# Patient Record
Sex: Female | Born: 1935 | Race: White | Hispanic: No | Marital: Married | Smoking: Never smoker
Health system: Southern US, Community
[De-identification: ages and names within clinical notes are randomized; demographics above are authoritative.]

## PROBLEM LIST (undated history)

## (undated) DIAGNOSIS — I509 Heart failure, unspecified: Secondary | ICD-10-CM

## (undated) DIAGNOSIS — I341 Nonrheumatic mitral (valve) prolapse: Secondary | ICD-10-CM

## (undated) DIAGNOSIS — R011 Cardiac murmur, unspecified: Secondary | ICD-10-CM

## (undated) DIAGNOSIS — I340 Nonrheumatic mitral (valve) insufficiency: Secondary | ICD-10-CM

## (undated) DIAGNOSIS — D649 Anemia, unspecified: Secondary | ICD-10-CM

## (undated) DIAGNOSIS — H6120 Impacted cerumen, unspecified ear: Secondary | ICD-10-CM

## (undated) DIAGNOSIS — G629 Polyneuropathy, unspecified: Secondary | ICD-10-CM

## (undated) DIAGNOSIS — E785 Hyperlipidemia, unspecified: Secondary | ICD-10-CM

## (undated) DIAGNOSIS — R0602 Shortness of breath: Secondary | ICD-10-CM

## (undated) DIAGNOSIS — A419 Sepsis, unspecified organism: Secondary | ICD-10-CM

## (undated) DIAGNOSIS — K649 Unspecified hemorrhoids: Secondary | ICD-10-CM

## (undated) DIAGNOSIS — I351 Nonrheumatic aortic (valve) insufficiency: Secondary | ICD-10-CM

## (undated) DIAGNOSIS — I5030 Unspecified diastolic (congestive) heart failure: Secondary | ICD-10-CM

## (undated) DIAGNOSIS — M1990 Unspecified osteoarthritis, unspecified site: Secondary | ICD-10-CM

## (undated) DIAGNOSIS — E039 Hypothyroidism, unspecified: Secondary | ICD-10-CM

## (undated) DIAGNOSIS — I2510 Atherosclerotic heart disease of native coronary artery without angina pectoris: Secondary | ICD-10-CM

## (undated) DIAGNOSIS — N3289 Other specified disorders of bladder: Secondary | ICD-10-CM

## (undated) DIAGNOSIS — Z8619 Personal history of other infectious and parasitic diseases: Secondary | ICD-10-CM

## (undated) DIAGNOSIS — C50919 Malignant neoplasm of unspecified site of unspecified female breast: Secondary | ICD-10-CM

## (undated) DIAGNOSIS — E7800 Pure hypercholesterolemia, unspecified: Secondary | ICD-10-CM

## (undated) DIAGNOSIS — N390 Urinary tract infection, site not specified: Secondary | ICD-10-CM

## (undated) HISTORY — DX: Sepsis, unspecified organism: A41.9

## (undated) HISTORY — DX: Hypothyroidism, unspecified: E03.9

## (undated) HISTORY — PX: MASTECTOMY: SHX3

## (undated) HISTORY — DX: Nonrheumatic mitral (valve) prolapse: I34.1

## (undated) HISTORY — DX: Nonrheumatic aortic (valve) insufficiency: I35.1

## (undated) HISTORY — DX: Atherosclerotic heart disease of native coronary artery without angina pectoris: I25.10

## (undated) HISTORY — DX: Unspecified osteoarthritis, unspecified site: M19.90

## (undated) HISTORY — DX: Anemia, unspecified: D64.9

## (undated) HISTORY — DX: Polyneuropathy, unspecified: G62.9

## (undated) HISTORY — PX: TONSILLECTOMY: SUR1361

## (undated) HISTORY — DX: Nonrheumatic mitral (valve) insufficiency: I34.0

## (undated) HISTORY — DX: Other specified disorders of bladder: N32.89

## (undated) HISTORY — DX: Hyperlipidemia, unspecified: E78.5

## (undated) HISTORY — PX: CATARACT EXTRACTION W/ INTRAOCULAR LENS  IMPLANT, BILATERAL: SHX1307

## (undated) HISTORY — DX: Personal history of other infectious and parasitic diseases: Z86.19

## (undated) HISTORY — DX: Urinary tract infection, site not specified: N39.0

## (undated) HISTORY — DX: Unspecified diastolic (congestive) heart failure: I50.30

## (undated) HISTORY — PX: CLOSED REDUCTION PATELLA FRACTURE: SUR230

## (undated) HISTORY — DX: Malignant neoplasm of unspecified site of unspecified female breast: C50.919

## (undated) HISTORY — PX: BREAST BIOPSY: SHX20

## (undated) HISTORY — DX: Impacted cerumen, unspecified ear: H61.20

---

## 2004-10-01 HISTORY — PX: APPENDECTOMY: SHX54

## 2005-02-16 ENCOUNTER — Encounter: Payer: Self-pay | Admitting: Internal Medicine

## 2005-04-09 ENCOUNTER — Ambulatory Visit: Payer: Self-pay | Admitting: Internal Medicine

## 2005-05-10 ENCOUNTER — Ambulatory Visit: Payer: Self-pay | Admitting: Internal Medicine

## 2005-05-18 ENCOUNTER — Ambulatory Visit: Payer: Self-pay | Admitting: Internal Medicine

## 2005-05-24 ENCOUNTER — Ambulatory Visit: Payer: Self-pay | Admitting: Cardiology

## 2005-06-23 ENCOUNTER — Ambulatory Visit: Payer: Self-pay | Admitting: Internal Medicine

## 2005-07-06 ENCOUNTER — Ambulatory Visit (HOSPITAL_COMMUNITY): Admission: RE | Admit: 2005-07-06 | Discharge: 2005-07-06 | Payer: Self-pay | Admitting: Internal Medicine

## 2005-07-06 ENCOUNTER — Ambulatory Visit: Payer: Self-pay | Admitting: Internal Medicine

## 2005-08-25 ENCOUNTER — Ambulatory Visit: Payer: Self-pay | Admitting: Cardiology

## 2005-10-19 ENCOUNTER — Ambulatory Visit: Payer: Self-pay | Admitting: Internal Medicine

## 2006-02-24 ENCOUNTER — Ambulatory Visit: Payer: Self-pay | Admitting: Cardiology

## 2006-02-28 ENCOUNTER — Ambulatory Visit: Payer: Self-pay | Admitting: Internal Medicine

## 2006-05-02 ENCOUNTER — Ambulatory Visit: Payer: Self-pay | Admitting: Internal Medicine

## 2006-06-28 ENCOUNTER — Ambulatory Visit: Payer: Self-pay | Admitting: Internal Medicine

## 2006-08-30 ENCOUNTER — Ambulatory Visit: Payer: Self-pay | Admitting: Cardiology

## 2006-10-19 ENCOUNTER — Ambulatory Visit: Payer: Self-pay | Admitting: Internal Medicine

## 2007-01-27 ENCOUNTER — Ambulatory Visit: Payer: Self-pay | Admitting: Cardiology

## 2007-02-07 ENCOUNTER — Encounter: Payer: Self-pay | Admitting: Cardiology

## 2007-02-07 ENCOUNTER — Ambulatory Visit: Payer: Self-pay

## 2007-07-07 ENCOUNTER — Ambulatory Visit: Payer: Self-pay | Admitting: Cardiology

## 2007-07-21 ENCOUNTER — Ambulatory Visit: Payer: Self-pay | Admitting: Cardiology

## 2007-07-21 LAB — CONVERTED CEMR LAB
Chloride: 109 meq/L (ref 96–112)
Glucose, Bld: 98 mg/dL (ref 70–99)

## 2007-10-16 ENCOUNTER — Encounter: Payer: Self-pay | Admitting: *Deleted

## 2007-10-16 DIAGNOSIS — E032 Hypothyroidism due to medicaments and other exogenous substances: Secondary | ICD-10-CM | POA: Insufficient documentation

## 2007-10-16 DIAGNOSIS — Z853 Personal history of malignant neoplasm of breast: Secondary | ICD-10-CM

## 2007-10-16 DIAGNOSIS — A3790 Whooping cough, unspecified species without pneumonia: Secondary | ICD-10-CM | POA: Insufficient documentation

## 2007-10-16 DIAGNOSIS — T462X1A Poisoning by other antidysrhythmic drugs, accidental (unintentional), initial encounter: Secondary | ICD-10-CM

## 2007-10-16 DIAGNOSIS — I359 Nonrheumatic aortic valve disorder, unspecified: Secondary | ICD-10-CM | POA: Insufficient documentation

## 2007-10-16 DIAGNOSIS — I2510 Atherosclerotic heart disease of native coronary artery without angina pectoris: Secondary | ICD-10-CM | POA: Insufficient documentation

## 2007-10-16 DIAGNOSIS — E785 Hyperlipidemia, unspecified: Secondary | ICD-10-CM

## 2007-10-16 DIAGNOSIS — R32 Unspecified urinary incontinence: Secondary | ICD-10-CM

## 2007-10-16 DIAGNOSIS — Z961 Presence of intraocular lens: Secondary | ICD-10-CM

## 2007-10-16 DIAGNOSIS — Z9849 Cataract extraction status, unspecified eye: Secondary | ICD-10-CM

## 2007-10-16 DIAGNOSIS — G609 Hereditary and idiopathic neuropathy, unspecified: Secondary | ICD-10-CM | POA: Insufficient documentation

## 2007-10-16 DIAGNOSIS — Z9010 Acquired absence of unspecified breast and nipple: Secondary | ICD-10-CM

## 2007-10-16 DIAGNOSIS — I059 Rheumatic mitral valve disease, unspecified: Secondary | ICD-10-CM | POA: Insufficient documentation

## 2007-10-16 DIAGNOSIS — Z9189 Other specified personal risk factors, not elsewhere classified: Secondary | ICD-10-CM | POA: Insufficient documentation

## 2007-10-16 DIAGNOSIS — M810 Age-related osteoporosis without current pathological fracture: Secondary | ICD-10-CM | POA: Insufficient documentation

## 2007-10-16 DIAGNOSIS — N3289 Other specified disorders of bladder: Secondary | ICD-10-CM

## 2007-10-16 DIAGNOSIS — M1990 Unspecified osteoarthritis, unspecified site: Secondary | ICD-10-CM | POA: Insufficient documentation

## 2007-10-16 DIAGNOSIS — K219 Gastro-esophageal reflux disease without esophagitis: Secondary | ICD-10-CM | POA: Insufficient documentation

## 2007-10-16 DIAGNOSIS — I080 Rheumatic disorders of both mitral and aortic valves: Secondary | ICD-10-CM | POA: Insufficient documentation

## 2007-10-16 DIAGNOSIS — Z9089 Acquired absence of other organs: Secondary | ICD-10-CM | POA: Insufficient documentation

## 2007-11-20 ENCOUNTER — Ambulatory Visit: Payer: Self-pay | Admitting: Internal Medicine

## 2007-11-20 DIAGNOSIS — D649 Anemia, unspecified: Secondary | ICD-10-CM

## 2007-11-20 LAB — CONVERTED CEMR LAB
Albumin: 3.7 g/dL (ref 3.5–5.2)
Alkaline Phosphatase: 47 units/L (ref 39–117)
BUN: 11 mg/dL (ref 6–23)
Basophils Absolute: 0.1 10*3/uL (ref 0.0–0.1)
Creatinine, Ser: 0.9 mg/dL (ref 0.4–1.2)
Eosinophils Absolute: 0.3 10*3/uL (ref 0.0–0.6)
Folate: >20 ng/mL
GFR calc Af Amer: 76 mL/min
GFR calc non Af Amer: 63 mL/min
HCT: 35.1 % — ABNORMAL LOW (ref 36.0–46.0)
HDL: 57.1 mg/dL (ref >39.0)
Lymphocytes Relative: 34.4 % (ref 12.0–46.0)
MCV: 95.2 fL (ref 78.0–100.0)
Monocytes Relative: 10 % (ref 3.0–11.0)
Neutro Abs: 3.7 10*3/uL (ref 1.4–7.7)
Neutrophils Relative %: 50.3 % (ref 43.0–77.0)
Sodium: 141 meq/L (ref 135–145)
TSH: 3.63 microintl units/mL (ref 0.35–5.50)
Total Bilirubin: 0.9 mg/dL (ref 0.3–1.2)
Total Protein: 6.7 g/dL (ref 6.0–8.3)
Triglycerides: 57 mg/dL (ref 0–149)
Vitamin B-12: 460 pg/mL (ref 211–911)
WBC: 7.3 10*3/uL (ref 4.5–10.5)

## 2007-11-21 ENCOUNTER — Encounter: Payer: Self-pay | Admitting: Internal Medicine

## 2008-01-11 ENCOUNTER — Ambulatory Visit: Payer: Self-pay | Admitting: Cardiology

## 2008-09-30 ENCOUNTER — Ambulatory Visit: Payer: Self-pay | Admitting: Cardiovascular Disease

## 2008-11-24 ENCOUNTER — Inpatient Hospital Stay (HOSPITAL_COMMUNITY): Admission: EM | Admit: 2008-11-24 | Discharge: 2008-11-29 | Payer: Self-pay | Admitting: Emergency Medicine

## 2008-11-24 ENCOUNTER — Ambulatory Visit: Payer: Self-pay | Admitting: Cardiology

## 2008-11-24 ENCOUNTER — Ambulatory Visit: Payer: Self-pay | Admitting: Internal Medicine

## 2008-11-25 ENCOUNTER — Encounter (INDEPENDENT_AMBULATORY_CARE_PROVIDER_SITE_OTHER): Payer: Self-pay | Admitting: Cardiology

## 2008-11-25 ENCOUNTER — Encounter: Payer: Self-pay | Admitting: Internal Medicine

## 2008-11-25 ENCOUNTER — Ambulatory Visit: Payer: Self-pay | Admitting: Vascular Surgery

## 2008-12-09 ENCOUNTER — Ambulatory Visit: Payer: Self-pay | Admitting: Internal Medicine

## 2008-12-09 ENCOUNTER — Telehealth: Payer: Self-pay | Admitting: Internal Medicine

## 2008-12-09 LAB — CONVERTED CEMR LAB
BUN: 19 mg/dL (ref 6–23)
Creatinine, Ser: 1.1 mg/dL (ref 0.4–1.2)
GFR calc Af Amer: 60 mL/min
Glucose, Bld: 96 mg/dL (ref 70–99)
Potassium: 3.8 meq/L (ref 3.5–5.1)
Sodium: 136 meq/L (ref 135–145)

## 2008-12-10 ENCOUNTER — Ambulatory Visit: Payer: Self-pay | Admitting: Internal Medicine

## 2008-12-10 DIAGNOSIS — I5030 Unspecified diastolic (congestive) heart failure: Secondary | ICD-10-CM | POA: Insufficient documentation

## 2008-12-10 DIAGNOSIS — N390 Urinary tract infection, site not specified: Secondary | ICD-10-CM

## 2008-12-16 ENCOUNTER — Ambulatory Visit: Payer: Self-pay | Admitting: Cardiovascular Disease

## 2008-12-23 ENCOUNTER — Encounter: Payer: Self-pay | Admitting: Internal Medicine

## 2009-01-07 ENCOUNTER — Telehealth: Payer: Self-pay | Admitting: Internal Medicine

## 2009-03-13 ENCOUNTER — Ambulatory Visit: Payer: Self-pay | Admitting: Internal Medicine

## 2009-03-13 DIAGNOSIS — H6120 Impacted cerumen, unspecified ear: Secondary | ICD-10-CM | POA: Insufficient documentation

## 2009-06-26 ENCOUNTER — Ambulatory Visit: Payer: Self-pay | Admitting: Cardiovascular Disease

## 2009-06-26 DIAGNOSIS — I5032 Chronic diastolic (congestive) heart failure: Secondary | ICD-10-CM

## 2009-06-30 LAB — CONVERTED CEMR LAB
CO2: 33 meq/L — ABNORMAL HIGH (ref 19–32)
Calcium: 9 mg/dL (ref 8.4–10.5)
Chloride: 101 meq/L (ref 96–112)
GFR calc non Af Amer: 55.43 mL/min (ref >60)

## 2009-07-18 ENCOUNTER — Ambulatory Visit: Payer: Self-pay | Admitting: Internal Medicine

## 2009-07-30 ENCOUNTER — Telehealth: Payer: Self-pay | Admitting: Cardiovascular Disease

## 2009-10-03 ENCOUNTER — Telehealth: Payer: Self-pay | Admitting: Cardiovascular Disease

## 2009-12-04 ENCOUNTER — Encounter: Payer: Self-pay | Admitting: Cardiovascular Disease

## 2009-12-04 ENCOUNTER — Ambulatory Visit: Payer: Self-pay

## 2009-12-04 ENCOUNTER — Ambulatory Visit: Payer: Self-pay | Admitting: Internal Medicine

## 2009-12-04 ENCOUNTER — Ambulatory Visit (HOSPITAL_COMMUNITY): Admission: RE | Admit: 2009-12-04 | Discharge: 2009-12-04 | Payer: Self-pay | Admitting: Cardiovascular Disease

## 2009-12-08 ENCOUNTER — Ambulatory Visit: Payer: Self-pay | Admitting: Cardiovascular Disease

## 2009-12-09 LAB — CONVERTED CEMR LAB
CO2: 34 meq/L — ABNORMAL HIGH (ref 19–32)
GFR calc non Af Amer: 49.61 mL/min (ref >60)
Glucose, Bld: 82 mg/dL (ref 70–99)
Sodium: 144 meq/L (ref 135–145)

## 2010-02-17 ENCOUNTER — Ambulatory Visit: Payer: Self-pay | Admitting: Internal Medicine

## 2010-02-17 LAB — CONVERTED CEMR LAB
BUN: 23 mg/dL (ref 6–23)
Basophils Absolute: 0 10*3/uL (ref 0.0–0.1)
Basophils Relative: 0.5 % (ref 0.0–3.0)
Chloride: 103 meq/L (ref 96–112)
Creatinine, Ser: 1.1 mg/dL (ref 0.4–1.2)
Eosinophils Absolute: 0.3 10*3/uL (ref 0.0–0.7)
Eosinophils Relative: 3.5 % (ref 0.0–5.0)
Iron: 47 ug/dL (ref 42–145)
MCV: 96.3 fL (ref 78.0–100.0)
Monocytes Absolute: 0.8 10*3/uL (ref 0.1–1.0)
Monocytes Relative: 10.4 % (ref 3.0–12.0)
Neutro Abs: 4.3 10*3/uL (ref 1.4–7.7)
Neutrophils Relative %: 56.4 % (ref 43.0–77.0)
Platelets: 150 10*3/uL (ref 150.0–400.0)
Potassium: 4 meq/L (ref 3.5–5.1)
RBC: 3.57 M/uL — ABNORMAL LOW (ref 3.87–5.11)
RDW: 14.2 % (ref 11.5–14.6)
Sodium: 144 meq/L (ref 135–145)
TSH: 3.95 microintl units/mL (ref 0.35–5.50)
Triglycerides: 155 mg/dL — ABNORMAL HIGH (ref 0.0–149.0)
VLDL: 31 mg/dL (ref 0.0–40.0)

## 2010-04-21 ENCOUNTER — Telehealth (INDEPENDENT_AMBULATORY_CARE_PROVIDER_SITE_OTHER): Payer: Self-pay | Admitting: *Deleted

## 2010-04-23 ENCOUNTER — Telehealth: Payer: Self-pay | Admitting: Cardiovascular Disease

## 2010-06-02 ENCOUNTER — Ambulatory Visit: Payer: Self-pay | Admitting: Cardiovascular Disease

## 2010-08-18 ENCOUNTER — Emergency Department (HOSPITAL_COMMUNITY)
Admission: EM | Admit: 2010-08-18 | Discharge: 2010-08-18 | Payer: Self-pay | Source: Home / Self Care | Admitting: Emergency Medicine

## 2010-08-25 ENCOUNTER — Ambulatory Visit: Payer: Self-pay | Admitting: Internal Medicine

## 2010-10-07 ENCOUNTER — Encounter: Payer: Self-pay | Admitting: Internal Medicine

## 2010-11-19 ENCOUNTER — Ambulatory Visit (HOSPITAL_COMMUNITY)
Admission: RE | Admit: 2010-11-19 | Discharge: 2010-11-19 | Payer: Self-pay | Source: Home / Self Care | Attending: Cardiovascular Disease | Admitting: Cardiovascular Disease

## 2010-11-19 ENCOUNTER — Encounter: Payer: Self-pay | Admitting: Cardiovascular Disease

## 2010-11-19 ENCOUNTER — Ambulatory Visit
Admission: RE | Admit: 2010-11-19 | Discharge: 2010-11-19 | Payer: Self-pay | Source: Home / Self Care | Attending: Cardiovascular Disease | Admitting: Cardiovascular Disease

## 2010-11-20 ENCOUNTER — Telehealth (INDEPENDENT_AMBULATORY_CARE_PROVIDER_SITE_OTHER): Payer: Self-pay | Admitting: *Deleted

## 2010-11-23 ENCOUNTER — Ambulatory Visit
Admission: RE | Admit: 2010-11-23 | Discharge: 2010-11-23 | Payer: Self-pay | Source: Home / Self Care | Attending: Cardiovascular Disease | Admitting: Cardiovascular Disease

## 2010-11-23 ENCOUNTER — Encounter: Payer: Self-pay | Admitting: Cardiovascular Disease

## 2010-11-30 ENCOUNTER — Ambulatory Visit: Admission: RE | Admit: 2010-11-30 | Discharge: 2010-11-30 | Payer: Self-pay | Source: Home / Self Care

## 2010-11-30 ENCOUNTER — Other Ambulatory Visit: Payer: Self-pay | Admitting: Cardiovascular Disease

## 2010-11-30 LAB — BASIC METABOLIC PANEL
CO2: 32 mEq/L (ref 19–32)
Calcium: 9.1 mg/dL (ref 8.4–10.5)
Creatinine, Ser: 1.3 mg/dL — ABNORMAL HIGH (ref 0.4–1.2)
GFR: 41.18 mL/min — ABNORMAL LOW (ref >60.00)
Sodium: 134 mEq/L — ABNORMAL LOW (ref 135–145)

## 2010-12-02 ENCOUNTER — Telehealth: Payer: Self-pay | Admitting: Cardiovascular Disease

## 2010-12-03 ENCOUNTER — Ambulatory Visit: Payer: Self-pay | Admitting: Cardiovascular Disease

## 2010-12-03 NOTE — Assessment & Plan Note (Signed)
Summary: 6 mo f/u      Allergies Added: NKDA  Visit Type:  Follow-up Primary Provider:  Norins  CC:  6 month follow up.  Pt states she is feeling well.  She does have SOB.  History of Present Illness: [name] is a pleasant 77 year old Caucasian female with a past medical history significant for hyperlipidemia, GERD, and severe mitral valvular regurgitation as well as diastolic dysfunction, who was admitted  to [facility] and treated for E. coli urosepsis as well as acute on chronic heart failure in January 2010 and was discharged on Lasix therapy.  She has done well on Lasix therapy.  She has some swelling in her ankles in the evening but this gets better at night. She continues to have mild dyspnea if she exerts herself.  She denies any episodes of chest pain, dizziness, near syncope, syncope, orthopnea or PND.  No other complaints today.    Current Medications (verified): 1)  Coreg 25 Mg  Tabs (Carvedilol) .... Take One Tablet Twice Daily 2)  Aspirin 81 Mg  Tabs (Aspirin) .... Take One Tablet Once Daily 3)  Multivitamins   Tabs (Multiple Vitamin) .... Take 1 Tablet By Mouth Once A Day 4)  Ferrous Sulfate 325 (65 Fe) Mg  Tabs (Ferrous Sulfate) .... As Needed 5)  Lasix 40 Mg Tabs (Furosemide) .... Take 1 Tablet By Mouth Once A Day 6)  Oscal 500/200 D-3 500-200 Mg-Unit  Tabs (Calcium-Vitamin D) .... Take 1 Tablet By Mouth Three Times A Day 7)  Digestive Advance Gas Defense Formula .... Take 1 Tablet By Mouth Once A Day As Needed 8)  Klor-Con 10 10 Meq Cr-Tabs (Potassium Chloride) .... 2 Tabs Once Daily 9)  Aleve 220 Mg Tabs (Naproxen Sodium) .... As Needed 10)  Preservision/lutein  Caps (Multiple Vitamins-Minerals) .... Take 1 Tablet By Mouth Two Times A Day 11)  Omega-3 Fish Oil 1000 Mg Caps (Omega-3 Fatty Acids) .... 2 Caps At Bedtime  Allergies (verified): No Known Drug Allergies  Past History:  Past Medical History: Reviewed history from 06/12/2009 and no changes  required. CORONARY ARTERY DISEASE (ICD-414.00) PERIPHERAL NEUROPATHY (ICD-356.9) AORTIC INSUFFICIENCY, MILD (ICD-424.1) CERUMEN IMPACTION, BILATERAL (ICD-380.4) UNSPECIFIED DIASTOLIC HEART FAILURE (ICD-428.30) HYPERLIPIDEMIA (ICD-272.4) MITRAL VALVE PROLAPSE (ICD-424.0) MITRAL REGURGITATION, SEVERE (ICD-396.3) UROSEPSIS (ICD-599.0) UNSPECIFIED ANEMIA (ICD-285.9) OSTEOPOROSIS (ICD-733.00) ADENOCARCINOMA, BREAST, HX OF (ICD-V10.3) Hx of IRRITABLE BLADDER (ICD-596.8) Hx of URINARY INCONTINENCE (ICD-788.30) HYPOTHYROIDISM, HX OF (ICD-V12.2) GASTROESOPHAGEAL REFLUX DISEASE (ICD-530.81) OSTEOARTHRITIS (ICD-715.90) APPENDECTOMY, HX OF (ICD-V45.79) Hx of WHOOPING COUGH (ICD-033.9) INTRAOCULAR LENS IMPLANT, BILATERAL, HX OF (ICD-V43.1) CATARACT EXTRACTIONS, BILATERAL, HX OF (ICD-V45.61) MASTECTOMY, LEFT, HX OF (ICD-V45.71) BREAST BIOPSY, HX OF (ICD-V15.9) FRACTURE, PATELLA REPAIR OF RIGHT KNEE (ICD-822.0) TONSILLECTOMY, HX OF (ICD-V45.79)    Social History: Reviewed history from 02/17/2010 and no changes required. HSG Married - 04/07/2038 - '2023-04-06 2 daughters - 1 died at birth, '25; 1 brother 2043/04/08; 2 grandchildren; 2 GGC Lives with daughter  End of LIfe Care: - has living will - DNR/DNI, no heroic measures Tobacco Use - No.  Alcohol Use - no Drug Use - no  Review of Systems       The patient complains of shortness of breath.  The patient denies fatigue, malaise, fever, weight gain/loss, vision loss, decreased hearing, hoarseness, chest pain, palpitations, prolonged cough, wheezing, sleep apnea, coughing up blood, abdominal pain, blood in stool, nausea, vomiting, diarrhea, heartburn, incontinence, blood in urine, muscle weakness, joint pain, leg swelling, rash, skin lesions, headache, fainting, dizziness, depression, anxiety, enlarged lymph  nodes, easy bruising or bleeding, and environmental allergies.    Vital Signs:  Patient profile:   77 year old female Height:      59 inches Weight:       135 pounds BMI:     27.37 Pulse rate:   69 / minute Pulse rhythm:   regular BP sitting:   118 / 58  (left arm)  Vitals Entered By: [name] CMA (June 02, 2010 1:56 PM)  Physical Exam  General:  General: Well developed, well nourished, NAD SKIN: warm, dry Psychiatric: Mood and affect normal Neck: No JVD, no carotid bruits, no thyromegaly, no lymphadenopathy. Lungs:Clear bilaterally, no wheezes, rhonci, crackles CV: RRR III/VI sysotlic murmurs at LSB. NO gallops rubs Abdomen: soft, NT, ND, BS present Extremities: Trace bilateral lower ext edema, pulses 1-2+.    EKG  Procedure date:  06/02/2010  Findings:      NSR, rate 69 bpm. Normal  Impression & Recommendations:  Problem # 1:  MITRAL REGURGITATION, SEVERE (ICD-396.3) Severe MR by echo 2/11. Volume status ok. Breathing is at baseline. I have discussed possibly adding an Ace inhibitor for afterload reduction but she does not wish to start any new medications. I think this is reasonable given her age and the fact that she is doing so well. Continue current therapy. Repeat echo in 6 months.   Problem # 2:  DIASTOLIC HEART FAILURE, CHRONIC (ICD-428.32) BP well controlled. No changes.   Her updated medication list for this problem includes:    Coreg 25 Mg Tabs (Carvedilol) .[name]... Take one tablet twice daily    Aspirin 81 Mg Tabs (Aspirin) .[name]... Take one tablet once daily    Lasix 40 Mg Tabs (Furosemide) .[name]... Take 1 tablet by mouth once a day  Patient Instructions: 1)  Your physician recommends that you schedule a follow-up appointment in: 6 months 2)  Your physician has requested that you have an echocardiogram.  Echocardiography is a painless test that uses sound waves to create images of your heart. It provides your doctor with information about the size and shape of your heart and how well your heart's chambers and valves are working.  This procedure takes approximately one hour. There are no restrictions for this  procedure. To be done in 6 months--week or so prior to appointment with Dr. Clifton James

## 2010-12-03 NOTE — Miscellaneous (Signed)
Summary: Declaration of Desire for a Natural Death  Declaration of Desire for a Natural Death   Imported By: Sherian Rein 02/26/2010 14:57:31  _____________________________________________________________________  External Attachment:    Type:   Image     Comment:   External Document

## 2010-12-03 NOTE — Assessment & Plan Note (Signed)
Summary: ER FU ON RECTAL BLEEDING/NWS  #   Vital Signs:  Patient profile:   77 year old female Height:      59 inches Weight:      134 pounds BMI:     27.16 O2 Sat:      96 % on Room air Temp:     97.7 degrees F oral Pulse rate:   68 / minute BP sitting:   102 / 60  (left arm) Cuff size:   regular  Vitals Entered By: Bill Salinas CMA (August 25, 2010 11:52 AM)  O2 Flow:  Room air CC: ER follow up from rectal bleeding/ ab   Primary Care Provider:  Norins  CC:  ER follow up from rectal bleeding/ ab.  History of Present Illness: Patient had a recent episode of hematochezia for which she was seen in the ED. ED record and all labs reviewed. She aparently had a hemorrhoidal bleed although cannot rule out diverticular bleed. She had a stable Hgb and has not had any further episodes of hematochezia. She is feeling well: no chest pain, no SOB, no rectal pain, no dizzyness.  Current Medications (verified): 1)  Coreg 25 Mg  Tabs (Carvedilol) .... Take One Tablet Twice Daily 2)  Aspirin 81 Mg  Tabs (Aspirin) .... Take One Tablet Once Daily 3)  Multivitamins   Tabs (Multiple Vitamin) .... Take 1 Tablet By Mouth Once A Day 4)  Ferrous Sulfate 325 (65 Fe) Mg  Tabs (Ferrous Sulfate) .... As Needed 5)  Lasix 40 Mg Tabs (Furosemide) .... Take 1 Tablet By Mouth Once A Day 6)  Oscal 500/200 D-3 500-200 Mg-Unit  Tabs (Calcium-Vitamin D) .... Take 1 Tablet By Mouth Three Times A Day 7)  Digestive Advance Gas Defense Formula .... Take 1 Tablet By Mouth Once A Day As Needed 8)  Klor-Con 10 10 Meq Cr-Tabs (Potassium Chloride) .... 2 Tabs Once Daily 9)  Aleve 220 Mg Tabs (Naproxen Sodium) .... As Needed 10)  Preservision/lutein  Caps (Multiple Vitamins-Minerals) .... Take 1 Tablet By Mouth Two Times A Day 11)  Omega-3 Fish Oil 1000 Mg Caps (Omega-3 Fatty Acids) .... 2 Caps At Bedtime  Allergies (verified): No Known Drug Allergies  Past History:  Past Medical History: Last updated:  06/12/2009 CORONARY ARTERY DISEASE (ICD-414.00) PERIPHERAL NEUROPATHY (ICD-356.9) AORTIC INSUFFICIENCY, MILD (ICD-424.1) CERUMEN IMPACTION, BILATERAL (ICD-380.4) UNSPECIFIED DIASTOLIC HEART FAILURE (ICD-428.30) HYPERLIPIDEMIA (ICD-272.4) MITRAL VALVE PROLAPSE (ICD-424.0) MITRAL REGURGITATION, SEVERE (ICD-396.3) UROSEPSIS (ICD-599.0) UNSPECIFIED ANEMIA (ICD-285.9) OSTEOPOROSIS (ICD-733.00) ADENOCARCINOMA, BREAST, HX OF (ICD-V10.3) Hx of IRRITABLE BLADDER (ICD-596.8) Hx of URINARY INCONTINENCE (ICD-788.30) HYPOTHYROIDISM, HX OF (ICD-V12.2) GASTROESOPHAGEAL REFLUX DISEASE (ICD-530.81) OSTEOARTHRITIS (ICD-715.90) APPENDECTOMY, HX OF (ICD-V45.79) Hx of WHOOPING COUGH (ICD-033.9) INTRAOCULAR LENS IMPLANT, BILATERAL, HX OF (ICD-V43.1) CATARACT EXTRACTIONS, BILATERAL, HX OF (ICD-V45.61) MASTECTOMY, LEFT, HX OF (ICD-V45.71) BREAST BIOPSY, HX OF (ICD-V15.9) FRACTURE, PATELLA REPAIR OF RIGHT KNEE (ICD-822.0) TONSILLECTOMY, HX OF (ICD-V45.79)    Past Surgical History: Last updated: 06/12/2009 APPENDECTOMY, HX OF (ICD-V45.79) INTRAOCULAR LENS IMPLANT, BILATERAL, HX OF (ICD-V43.1) CATARACT EXTRACTIONS, BILATERAL, HX OF (ICD-V45.61) MASTECTOMY, LEFT, HX OF (ICD-V45.71) BREAST BIOPSY, HX OF (ICD-V15.9) FRACTURE, PATELLA REPAIR OF RIGHT KNEE (ICD-822.0) TONSILLECTOMY, HX OF (ICD-V45.79)  Social History: Last updated: 02/17/2010 HSG Married - 03/21/2038 - '03/20/2023 2 daughters - 1 died at birth, '49; 1 brother 2043/03/22; 2 grandchildren; 2 GGC Lives with daughter  End of LIfe Care: - has living will - DNR/DNI, no heroic measures Tobacco Use - No.  Alcohol Use - no Drug Use - no  Review of Systems  The patient denies fever, weight loss, chest pain, syncope, dyspnea on exertion, abdominal pain, hematochezia, muscle weakness, and abnormal bleeding.    Physical Exam  General:  WNWD white woman looking younger than her 89 years in no distress Head:  normocephalic and atraumatic.   Eyes:  C&S  clear Neck:  supple.   Lungs:  normal respiratory effort and normal breath sounds.   Heart:  normal rate and regular rhythm.   Neurologic:  alert & oriented X3, cranial nerves II-XII intact, and gait normal.   Skin:  turgor normal and color normal.   Psych:  Oriented X3, normally interactive, and good eye contact.     Impression & Recommendations:  Problem # 1:  HEMATOCHEZIA (ICD-578.1) Patient with a single episode of hematochezia with a normal ED evaluation. She has not had any recurrence and she is feeling well. Most likely a hemorrhoidal bleed.  Plan - no further evaluation at this time.  Complete Medication List: 1)  Coreg 25 Mg Tabs (Carvedilol) .... Take one tablet twice daily 2)  Aspirin 81 Mg Tabs (Aspirin) .... Take one tablet once daily 3)  Multivitamins Tabs (Multiple vitamin) .... Take 1 tablet by mouth once a day 4)  Ferrous Sulfate 325 (65 Fe) Mg Tabs (Ferrous sulfate) .... As needed 5)  Lasix 40 Mg Tabs (Furosemide) .... Take 1 tablet by mouth once a day 6)  Oscal 500/200 D-3 500-200 Mg-unit Tabs (Calcium-vitamin d) .... Take 1 tablet by mouth three times a day 7)  Digestive Advance Gas Defense Formula  .... Take 1 tablet by mouth once a day as needed 8)  Klor-con 10 10 Meq Cr-tabs (Potassium chloride) .... 2 tabs once daily 9)  Aleve 220 Mg Tabs (Naproxen sodium) .... As needed 10)  Preservision/lutein Caps (Multiple vitamins-minerals) .... Take 1 tablet by mouth two times a day 11)  Omega-3 Fish Oil 1000 Mg Caps (Omega-3 fatty acids) .... 2 caps at bedtime   Orders Added: 1)  Est. Patient Level III [60454]

## 2010-12-03 NOTE — Progress Notes (Signed)
Summary: review lab/medicines   Phone Note Other Incoming   Summary of Call: Pt's daughter, [name], dropped off pages 4 and 5 from pt's office visit with Dr. Debby Bud  dated February 18, 2010 with Problem #4 highlighted. Also left copy of lab work dated February 18, 2010 with LDL of 23 highlighted. Note from daughter attached to this paperwork requesting Dr. Gibson Ramp recommendation regarding stopping lovastatin or reducing the dosage.  Will review with Dr. Clifton James when he is back in office. Daughter's phone number is [phone]. Spoke with daughter and let her know Dr.Lita Murillo is out of office this week Initial call taken by: Dossie Arbour, RN, BSN,  April 23, 2010 2:04 PM  Follow-up for Phone Call        Spoke with daughter. ok to stop lovastatin. cdm Follow-up by: Verne Carrow, MD,  April 28, 2010 1:45 PM

## 2010-12-03 NOTE — Progress Notes (Signed)
   Walk in Patient Form Recieved " Pt has questions about Med change" sent to Message Nurse [facility]  April 21, 2010 9:20 AM

## 2010-12-03 NOTE — Assessment & Plan Note (Signed)
Summary: CPX, LAST SCHED CPX WAS BUMPED/#/CD   Vital Signs:  Patient profile:   77 year old female Height:      59 inches (149.86 cm) Weight:      135.25 pounds (61.48 kg) O2 Sat:      96 % on Room air Temp:     97.9 degrees F (36.61 degrees C) oral Pulse rate:   67 / minute Pulse rhythm:   regular BP sitting:   112 / 54  (left arm) Cuff size:   regular  Vitals Entered By: Brenton Grills (February 17, 2010 11:14 AM)  O2 Flow:  Room air CC: CPX/aj  Vision Screening:      Vision Comments: last eye exam was in 04/2009 with Dr. [place]   Primary Care Provider:  Jabree Rebert  CC:  CPX/aj.  History of Present Illness: Bone health - reviewed DXA from [employer] - she is osteoporotic at spine and hip. she has been on bisphosphonate for much more than 5 years. She does c/o deep bone pain in the distal LE and of leg cramps.  Neck pain - stiffness and discomfort, limited range of motion.   Memory loss - poor recall of names, word finding.  Dysphagia - can choke on water, can't cough/clear her throat. Aspiration of thin liquids. Discussed swallow precautions including chin tuck.  Inspissated secretions - in the AM thick secretions hard to clear. Cantake mucinex at bedtime.   Iron deficiency anemia - question about how long to continue iron supplement. She does get "draggy" and this is helped by iron supplement.   Small dark lesion on the right zygoma.  Current Medications (verified): 1)  Coreg 25 Mg  Tabs (Carvedilol) .... Take One Tablet Twice Daily 2)  Lovastatin 40 Mg  Tabs (Lovastatin) .... Take One Tablet Once Daily 3)  Actonel 35 Mg  Tabs (Risedronate Sodium) .... Take Weekly As Directed 4)  Aspirin 81 Mg  Tabs (Aspirin) .... Take One Tablet Once Daily 5)  Multivitamins   Tabs (Multiple Vitamin) .... Take 1 Tablet By Mouth Once A Day 6)  Ferrous Sulfate 325 (65 Fe) Mg  Tabs (Ferrous Sulfate) .... As Needed 7)  Lasix 40 Mg Tabs (Furosemide) .... Take 1 Tablet By Mouth Once A  Day 8)  Oscal 500/200 D-3 500-200 Mg-Unit  Tabs (Calcium-Vitamin D) .... Take 1 Tablet By Mouth Three Times A Day 9)  Digestive Advance Gas Defense Formula .... Take 1 Tablet By Mouth Once A Day As Needed 10)  Klor-Con 10 10 Meq Cr-Tabs (Potassium Chloride) .... 2 Tabs Once Daily 11)  Aleve 220 Mg Tabs (Naproxen Sodium) .... As Needed 12)  Preservision/lutein  Caps (Multiple Vitamins-Minerals) .... Take 1 Tablet By Mouth Two Times A Day 13)  Omega-3 Fish Oil 1000 Mg Caps (Omega-3 Fatty Acids) .... 2 Caps At Bedtime  Allergies (verified): No Known Drug Allergies  Past History:  Past Medical History: Last updated: 06/12/2009 CORONARY ARTERY DISEASE (ICD-414.00) PERIPHERAL NEUROPATHY (ICD-356.9) AORTIC INSUFFICIENCY, MILD (ICD-424.1) CERUMEN IMPACTION, BILATERAL (ICD-380.4) UNSPECIFIED DIASTOLIC HEART FAILURE (ICD-428.30) HYPERLIPIDEMIA (ICD-272.4) MITRAL VALVE PROLAPSE (ICD-424.0) MITRAL REGURGITATION, SEVERE (ICD-396.3) UROSEPSIS (ICD-599.0) UNSPECIFIED ANEMIA (ICD-285.9) OSTEOPOROSIS (ICD-733.00) ADENOCARCINOMA, BREAST, HX OF (ICD-V10.3) Hx of IRRITABLE BLADDER (ICD-596.8) Hx of URINARY INCONTINENCE (ICD-788.30) HYPOTHYROIDISM, HX OF (ICD-V12.2) GASTROESOPHAGEAL REFLUX DISEASE (ICD-530.81) OSTEOARTHRITIS (ICD-715.90) APPENDECTOMY, HX OF (ICD-V45.79) Hx of WHOOPING COUGH (ICD-033.9) INTRAOCULAR LENS IMPLANT, BILATERAL, HX OF (ICD-V43.1) CATARACT EXTRACTIONS, BILATERAL, HX OF (ICD-V45.61) MASTECTOMY, LEFT, HX OF (ICD-V45.71) BREAST BIOPSY, HX OF (ICD-V15.9) FRACTURE, PATELLA REPAIR OF  RIGHT KNEE (ICD-822.0) TONSILLECTOMY, HX OF (ICD-V45.79)    Past Surgical History: Last updated: 06/12/2009 APPENDECTOMY, HX OF (ICD-V45.79) INTRAOCULAR LENS IMPLANT, BILATERAL, HX OF (ICD-V43.1) CATARACT EXTRACTIONS, BILATERAL, HX OF (ICD-V45.61) MASTECTOMY, LEFT, HX OF (ICD-V45.71) BREAST BIOPSY, HX OF (ICD-V15.9) FRACTURE, PATELLA REPAIR OF RIGHT KNEE (ICD-822.0) TONSILLECTOMY, HX OF  (ICD-V45.79)  Family History: Last updated: 06/12/2009 Mother: heart disease/history of breast cancer/deceased Father: deceased/stroke/heart disease Siblings:3 deceased sisters 1 sister CHF 1 sister Alzheimers/DM 1 sister heart disease 1 brother heart disease/deceased 1 brother arthritis  Social History: HSG Married - Apr 06, 2038 - 'Apr 05, 2023 2 daughters - 1 died at birth, '13; 1 brother 04/07/43; 2 grandchildren; 2 GGC Lives with daughter  End of LIfe Care: - has living will - DNR/DNI, no heroic measures Tobacco Use - No.  Alcohol Use - no Drug Use - no  Review of Systems       The patient complains of decreased hearing and peripheral edema.  The patient denies anorexia, fever, weight loss, weight gain, chest pain, syncope, dyspnea on exertion, headaches, abdominal pain, severe indigestion/heartburn, muscle weakness, difficulty walking, depression, and angioedema.    Physical Exam  General:  Elderly white female in no distress Head:  Normocephalic and atraumatic without obvious abnormalities. No apparent alopecia or balding. Eyes:  vision grossly intact, pupils equal, pupils round, corneas and lenses clear, and no injection.   Neck:  supple and full ROM.  Small nodule at C6-7. Decreased ROM. Chest Wall:  Kyphosis Breasts:  deferred Lungs:  Normal respiratory effort, chest expands symmetrically. Lungs are clear to auscultation, no crackles or wheezes. Heart:  normal rate and regular rhythm.  II/VI soft holosystolic murmur best at left sternal border. Abdomen:  soft, non-tender, and normal bowel sounds.   Msk:  normal ROM, no joint tenderness, no joint swelling, no joint warmth, and no redness over joints.   Pulses:  2+ radial Extremities:  no deformity Neurologic:  alert & oriented X3, cranial nerves II-XII intact, strength normal in all extremities, sensation intact to light touch, gait normal, and DTRs symmetrical and normal.   Skin:  turgor normal, color normal, no rashes, no suspicious  lesions, no petechiae, and no ulcerations.   Cervical Nodes:  no anterior cervical adenopathy and no posterior cervical adenopathy.   Psych:  Oriented X3, memory intact for recent and remote, normally interactive, and good eye contact.     Impression & Recommendations:  Problem # 1:  DIASTOLIC HEART FAILURE, CHRONIC (ICD-428.32) Stable at today's exam with sign of decompensation  Plan - continue present meds.  Her updated medication list for this problem includes:    Coreg 25 Mg Tabs (Carvedilol) .[name]... Take one tablet twice daily    Aspirin 81 Mg Tabs (Aspirin) .[name]... Take one tablet once daily    Lasix 40 Mg Tabs (Furosemide) .[name]... Take 1 tablet by mouth once a day  Problem # 2:  CORONARY ARTERY DISEASE (ICD-414.00) Doing well with no complaints of chest pain or discomfort  Her updated medication list for this problem includes:    Coreg 25 Mg Tabs (Carvedilol) .[name]... Take one tablet twice daily    Aspirin 81 Mg Tabs (Aspirin) .[name]... Take one tablet once daily    Lasix 40 Mg Tabs (Furosemide) .[name]... Take 1 tablet by mouth once a day  Problem # 3:  UNSPECIFIED ANEMIA (ICD-285.9) Patient has been on iron for anemia. She tolerates this well.  Plan - follow-up lab with recommendations to follow.   Her updated medication list for this  problem includes:    Ferrous Sulfate 325 (65 Fe) Mg Tabs (Ferrous sulfate) .[name]... As needed  Orders: TLB-CBC Platelet - w/Differential (85025-CBCD) TLB-IBC Pnl (Iron/FE;Transferrin) (83550-IBC)  Addendum - Hgb 11.8 which is good. Iron saturation is 17% which is low  Plan - continue iron supplementation  Problem # 4:  HYPERLIPIDEMIA (ICD-272.4) Will check lab.  Her updated medication list for this problem includes:    Lovastatin 40 Mg Tabs (Lovastatin) .[name]... Take one tablet once daily  Orders: TLB-Lipid Panel (80061-LIPID)  Addendum - LDL is 23.  Plan may stop lovastatin at will  Problem # 5:  OSTEOPOROSIS (ICD-733.00) Discussed on-going  therapy at length. She has been on biphosphonates for years. She has no intolerance. Shehas not had a DXA for several years. There is no data to know if there is a good indication to continue therapy.  Plan - may may continue actonel at her descretion or stop if she so wishes.  Her updated medication list for this problem includes:    Actonel 35 Mg Tabs (Risedronate sodium) .[name]... Take weekly as directed  Orders: TLB-BMP (Basic Metabolic Panel-BMET) (80048-METABOL)  Problem # 6:  End of Life Care Reviewed with her her wishes in regard to end of life care including resuscitation and heroic measures.  With her daughter present she clearly states she would not want attempted resuscitation, mechanical ventilation or other heroic measures that would no enhance her QOL.  Completed out of facility order Will file her Living will and HCPOA Completed MOST form- DNR/DNI, no heroics but she does wish active curative care.  Complete Medication List: 1)  Coreg 25 Mg Tabs (Carvedilol) .... Take one tablet twice daily 2)  Lovastatin 40 Mg Tabs (Lovastatin) .... Take one tablet once daily 3)  Actonel 35 Mg Tabs (Risedronate sodium) .... Take weekly as directed 4)  Aspirin 81 Mg Tabs (Aspirin) .... Take one tablet once daily 5)  Multivitamins Tabs (Multiple vitamin) .... Take 1 tablet by mouth once a day 6)  Ferrous Sulfate 325 (65 Fe) Mg Tabs (Ferrous sulfate) .... As needed 7)  Lasix 40 Mg Tabs (Furosemide) .... Take 1 tablet by mouth once a day 8)  Oscal 500/200 D-3 500-200 Mg-unit Tabs (Calcium-vitamin d) .... Take 1 tablet by mouth three times a day 9)  Digestive Advance Gas Defense Formula  .... Take 1 tablet by mouth once a day as needed 10)  Klor-con 10 10 Meq Cr-tabs (Potassium chloride) .... 2 tabs once daily 11)  Aleve 220 Mg Tabs (Naproxen sodium) .... As needed 12)  Preservision/lutein Caps (Multiple vitamins-minerals) .... Take 1 tablet by mouth two times a day 13)  Omega-3 Fish Oil 1000  Mg Caps (Omega-3 fatty acids) .... 2 caps at bedtime  Other Orders: TLB-TSH (Thyroid Stimulating Hormone) (16109-UEA)  Patient: [name] Note: All result statuses are Final unless otherwise noted.  Tests: (1) Lipid Panel (LIPID)   Cholesterol               115 mg/dL                   5-409     ATP III Classification            Desirable:  < 200 mg/dL                    Borderline High:  200 - 239 mg/dL               High:  > =  240 mg/dL   Triglycerides        [H]  155.0 mg/dL                 1.6-109.6     Normal:  <150 mg/dL     Borderline High:  045 - 199 mg/dL   HDL                       40.98 mg/dL                 >11.91   VLDL Cholesterol          31.0 mg/dL                  4.7-82.9   LDL Cholesterol           23 mg/dL                    5-62  CHO/HDL Ratio:  CHD Risk                             2                    Men          Women     1/2 Average Risk     3.4          3.3     Average Risk          5.0          4.4     2X Average Risk          9.6          7.1     3X Average Risk          15.0          11.0                           Tests: (2) TSH (TSH)   FastTSH                   3.95 uIU/mL                 0.35-5.50  Tests: (3) CBC Platelet w/Diff (CBCD)   White Cell Count          7.7 K/uL                    4.5-10.5   Red Cell Count       [L]  3.57 Mil/uL                 3.87-5.11   Hemoglobin           [L]  11.8 g/dL                   13.0-86.5   Hematocrit           [L]  34.4 %                      36.0-46.0   MCV                       96.3 fl                     78.0-100.0  MCHC                      34.4 g/dL                   16.1-09.6   RDW                       14.2 %                      11.5-14.6   Platelet Count            150.0 K/uL                  150.0-400.0   Neutrophil %              56.4 %                      43.0-77.0   Lymphocyte %              29.2 %                      12.0-46.0   Monocyte %                10.4 %                       3.0-12.0   Eosinophils%              3.5 %                       0.0-5.0   Basophils %               0.5 %                       0.0-3.0   Neutrophill Absolute      4.3 K/uL                    1.4-7.7   Lymphocyte Absolute       2.2 K/uL                    0.7-4.0   Monocyte Absolute         0.8 K/uL                    0.1-1.0  Eosinophils, Absolute                             0.3 K/uL                    0.0-0.7   Basophils Absolute        0.0 K/uL                    0.0-0.1  Tests: (4) IBC Panel (IBC)   Iron                      47 ug/dL                    04-540   Transferrin               245.1 mg/dL  212.0-360.0   Iron Saturation      [L]  13.7 %                      20.0-50.0  Tests: (5) BMP (METABOL)   Sodium                    144 mEq/L                   135-145   Potassium                 4.0 mEq/L                   3.5-5.1   Chloride                  103 mEq/L                   96-112   Carbon Dioxide       [H]  33 mEq/L                    19-32   Glucose              [H]  102 mg/dL                   04-54   BUN                       23 mg/dL                    0-98   Creatinine                1.1 mg/dL                   1.1-9.1   Calcium                   9.3 mg/dL                   4.7-82.9   GFR                       49.59 mL/min                >60  Preventive Care Screening  Last Flu Shot:    Date:  07/10/2009    Results:  historical   Pap Smear:    Date:  11/01/2008    Results:  normal

## 2010-12-03 NOTE — Assessment & Plan Note (Signed)
Summary: per check out/sf  Medications Added KLOR-CON 10 10 MEQ CR-TABS (POTASSIUM CHLORIDE) 2 tabs once daily OMEGA-3 FISH OIL 1000 MG CAPS (OMEGA-3 FATTY ACIDS) 2 caps at bedtime      Allergies Added: NKDA  Visit Type:  rov Primary Provider:  Norins  CC:  sob.Marland Kitchenedema/legs/feet.Marland Kitchendenies any cp.  History of Present Illness: Ms. Plaisted is a pleasant 77 year old Caucasian female with a past medical history significant for hyperlipidemia, GERD, and severe mitral valvular regurgitation as well as diastolic dysfunction, who was admitted  to [facility] and treated for E. coli urosepsis as well as acute on chronic heart failure in January 2010 and was discharged on Lasix therapy.  She reports much improvement in her baseline breathing since being on the Lasix. She has some swelling in her ankles in the evening but this gets better at night. She continues to have dyspnea if she exerts herself.  This has worsened slightly per her daughter.  She denies any episodes of chest pain, dizziness, near syncope, syncope, orthopnea or PND.  No other complaints today.   Current Medications (verified): 1)  Coreg 25 Mg  Tabs (Carvedilol) .... Take One Tablet Twice Daily 2)  Lovastatin 40 Mg  Tabs (Lovastatin) .... Take One Tablet Once Daily 3)  Actonel 35 Mg  Tabs (Risedronate Sodium) .... Take Weekly As Directed 4)  Aspirin 81 Mg  Tabs (Aspirin) .... Take One Tablet Once Daily 5)  Multivitamins   Tabs (Multiple Vitamin) .... Take 1 Tablet By Mouth Once A Day 6)  Ferrous Sulfate 325 (65 Fe) Mg  Tabs (Ferrous Sulfate) .... As Needed 7)  Lasix 40 Mg Tabs (Furosemide) .... Take 1 Tablet By Mouth Once A Day 8)  Oscal 500/200 D-3 500-200 Mg-Unit  Tabs (Calcium-Vitamin D) .... Take 1 Tablet By Mouth Three Times A Day 9)  Digestive Advance Gas Defense Formula .... Take 1 Tablet By Mouth Once A Day As Needed 10)  Klor-Con 10 10 Meq Cr-Tabs (Potassium Chloride) .... 2 Tabs Once Daily 11)  Aleve 220 Mg Tabs  (Naproxen Sodium) .... As Needed 12)  Preservision/lutein  Caps (Multiple Vitamins-Minerals) .... Take 1 Tablet By Mouth Two Times A Day 13)  Omega-3 Fish Oil 1000 Mg Caps (Omega-3 Fatty Acids) .... 2 Caps At Bedtime  Allergies (verified): No Known Drug Allergies  Past History:  Past Medical History: Reviewed history from 06/12/2009 and no changes required. CORONARY ARTERY DISEASE (ICD-414.00) PERIPHERAL NEUROPATHY (ICD-356.9) AORTIC INSUFFICIENCY, MILD (ICD-424.1) CERUMEN IMPACTION, BILATERAL (ICD-380.4) UNSPECIFIED DIASTOLIC HEART FAILURE (ICD-428.30) HYPERLIPIDEMIA (ICD-272.4) MITRAL VALVE PROLAPSE (ICD-424.0) MITRAL REGURGITATION, SEVERE (ICD-396.3) UROSEPSIS (ICD-599.0) UNSPECIFIED ANEMIA (ICD-285.9) OSTEOPOROSIS (ICD-733.00) ADENOCARCINOMA, BREAST, HX OF (ICD-V10.3) Hx of IRRITABLE BLADDER (ICD-596.8) Hx of URINARY INCONTINENCE (ICD-788.30) HYPOTHYROIDISM, HX OF (ICD-V12.2) GASTROESOPHAGEAL REFLUX DISEASE (ICD-530.81) OSTEOARTHRITIS (ICD-715.90) APPENDECTOMY, HX OF (ICD-V45.79) Hx of WHOOPING COUGH (ICD-033.9) INTRAOCULAR LENS IMPLANT, BILATERAL, HX OF (ICD-V43.1) CATARACT EXTRACTIONS, BILATERAL, HX OF (ICD-V45.61) MASTECTOMY, LEFT, HX OF (ICD-V45.71) BREAST BIOPSY, HX OF (ICD-V15.9) FRACTURE, PATELLA REPAIR OF RIGHT KNEE (ICD-822.0) TONSILLECTOMY, HX OF (ICD-V45.79)    Social History: Reviewed history from 06/12/2009 and no changes required. lives with daughter. Husband in [place] Retired  Married  Tobacco Use - No.  Alcohol Use - no Drug Use - no  Review of Systems       The patient complains of shortness of breath and leg swelling.  The patient denies fatigue, malaise, fever, weight gain/loss, vision loss, decreased hearing, hoarseness, chest pain, palpitations, prolonged cough, wheezing, sleep apnea, coughing up blood, abdominal  pain, blood in stool, nausea, vomiting, diarrhea, heartburn, incontinence, blood in urine, muscle weakness, joint pain, rash, skin  lesions, headache, fainting, dizziness, depression, anxiety, enlarged lymph nodes, easy bruising or bleeding, and environmental allergies.    Vital Signs:  Patient profile:   77 year old female Height:      59 inches Weight:      135 pounds Pulse rate:   70 / minute Pulse rhythm:   regular BP sitting:   122 / 70  (right arm) Cuff size:   regular  Vitals Entered By: Danielle Rankin, CMA (December 08, 2009 12:13 PM)  Physical Exam  General:  General: Well developed, well nourished, NAD SKIN: warm, dry Psychiatric: Mood and affect normal Neck: No JVD, no carotid bruits, no thyromegaly, no lymphadenopathy. Lungs:Clear bilaterally, no wheezes, rhonci, crackles CV: RRR III/VI sysotlic murmurs at LSB. NO gallops rubs Abdomen: soft, NT, ND, BS present Extremities: Trace bilateral lower ext edema, pulses 1-2+.    Echocardiogram  Procedure date:  12/04/2009  Findings:      - Left ventricle: The cavity size was normal. Wall thickness was       increased in a pattern of mild LVH. Systolic function was normal.       The estimated ejection fraction was in the range of 55% to 60%.     - Mitral valve: MR is 3-4/4 in severity. Calcified annulus. Mildly       thickened leaflets . Moderate to severe regurgitation.     - Left atrium: The atrium was moderately dilated.     - Pulmonary arteries: PA peak pressure: 65mm Hg (S).  Impression & Recommendations:  Problem # 1:  MITRAL REGURGITATION, SEVERE (ICD-396.3) Stable. I do not think that she will be an operable candidate given her age but she is stable at this time. Her LV size is normal and LV function is normal. Will plan to see her back in 6 months. Will continue Lasix at current dose. Check BMET today.   Problem # 2:  DIASTOLIC HEART FAILURE, CHRONIC (ICD-428.32) Volume status ok. Continue Lasix.   Her updated medication list for this problem includes:    Coreg 25 Mg Tabs (Carvedilol) .[name]... Take one tablet twice daily    Aspirin 81 Mg  Tabs (Aspirin) .[name]... Take one tablet once daily    Lasix 40 Mg Tabs (Furosemide) .[name]... Take 1 tablet by mouth once a day  Other Orders: TLB-BMP (Basic Metabolic Panel-BMET) (80048-METABOL)  Patient Instructions: 1)  Your physician recommends that you schedule a follow-up appointment in: 6 months 2)  Your physician recommends that you continue on your current medications as directed. Please refer to the Current Medication list given to you today.

## 2010-12-03 NOTE — Miscellaneous (Signed)
Summary: Health Care POA  Health Care POA   Imported By: Sherian Rein 02/26/2010 14:55:02  _____________________________________________________________________  External Attachment:    Type:   Image     Comment:   External Document

## 2010-12-03 NOTE — Letter (Signed)
Summary: Breast Prosthesis [employer] Bras/[employer] to [facility]  Breast Prosthesis & Bras/[employer]one to [employer]   Imported By: Sherian Rein 10/09/2010 12:10:41  _____________________________________________________________________  External Attachment:    Type:   Image     Comment:   External Document

## 2010-12-03 NOTE — Assessment & Plan Note (Signed)
Summary: 6 month follow up/39603/pla  Medications Added LASIX 40 MG TABS (FUROSEMIDE) Take 1 tablet by mouth two times a day. POTASSIUM CHLORIDE 20 MEQ PACK (POTASSIUM CHLORIDE) take one tablet by mouth two times a day.      Allergies Added: NKDA  Visit Type:  Follow-up Primary Provider:  Norins  CC:  SOB.  History of Present Illness: [name] is a pleasant 77 year old Caucasian female with a past medical history significant for hyperlipidemia, GERD, and severe mitral valvular regurgitation as well as diastolic dysfunction, who was admitted  to [facility] and treated for E. coli urosepsis as well as acute on chronic heart failure in January 2010 and was discharged on Lasix therapy.  She has done well on Lasix therapy.  She has some swelling in her ankles in the evening but this gets better at night. She continues to have dyspnea if she exerts herself.  This has worsened over the last year. She denies any episodes of chest pain, dizziness, near syncope, syncope, orthopnea or PND.  No other complaints today. Recent echo last week grossly unchanged from prior. MR is severe. LA is dilated. (see report below).    Current Medications (verified): 1)  Coreg 25 Mg  Tabs (Carvedilol) .... Take One Tablet Twice Daily 2)  Aspirin 81 Mg  Tabs (Aspirin) .... Take One Tablet Once Daily 3)  Multivitamins   Tabs (Multiple Vitamin) .... Take 1 Tablet By Mouth Once A Day 4)  Ferrous Sulfate 325 (65 Fe) Mg  Tabs (Ferrous Sulfate) .... As Needed 5)  Lasix 40 Mg Tabs (Furosemide) .... Take 1 Tablet By Mouth Once A Day 6)  Oscal 500/200 D-3 500-200 Mg-Unit  Tabs (Calcium-Vitamin D) .... Take 1 Tablet By Mouth Three Times A Day 7)  Klor-Con 10 10 Meq Cr-Tabs (Potassium Chloride) .... 2 Tabs Once Daily 8)  Aleve 220 Mg Tabs (Naproxen Sodium) .... As Needed 9)  Preservision/lutein  Caps (Multiple Vitamins-Minerals) .... Take 1 Tablet By Mouth Two Times A Day 10)  Omega-3 Fish Oil 1000 Mg Caps  (Omega-3 Fatty Acids) .... 2 Caps At Bedtime  Allergies (verified): No Known Drug Allergies  Past History:  Past Medical History: Reviewed history from 06/12/2009 and no changes required. CORONARY ARTERY DISEASE (ICD-414.00) PERIPHERAL NEUROPATHY (ICD-356.9) AORTIC INSUFFICIENCY, MILD (ICD-424.1) CERUMEN IMPACTION, BILATERAL (ICD-380.4) UNSPECIFIED DIASTOLIC HEART FAILURE (ICD-428.30) HYPERLIPIDEMIA (ICD-272.4) MITRAL VALVE PROLAPSE (ICD-424.0) MITRAL REGURGITATION, SEVERE (ICD-396.3) UROSEPSIS (ICD-599.0) UNSPECIFIED ANEMIA (ICD-285.9) OSTEOPOROSIS (ICD-733.00) ADENOCARCINOMA, BREAST, HX OF (ICD-V10.3) Hx of IRRITABLE BLADDER (ICD-596.8) Hx of URINARY INCONTINENCE (ICD-788.30) HYPOTHYROIDISM, HX OF (ICD-V12.2) GASTROESOPHAGEAL REFLUX DISEASE (ICD-530.81) OSTEOARTHRITIS (ICD-715.90) APPENDECTOMY, HX OF (ICD-V45.79) Hx of WHOOPING COUGH (ICD-033.9) INTRAOCULAR LENS IMPLANT, BILATERAL, HX OF (ICD-V43.1) CATARACT EXTRACTIONS, BILATERAL, HX OF (ICD-V45.61) MASTECTOMY, LEFT, HX OF (ICD-V45.71) BREAST BIOPSY, HX OF (ICD-V15.9) FRACTURE, PATELLA REPAIR OF RIGHT KNEE (ICD-822.0) TONSILLECTOMY, HX OF (ICD-V45.79)    Social History: Reviewed history from 02/17/2010 and no changes required. HSG Married - 11-Apr-2038 - '2023/04/10 2 daughters - 1 died at birth, '69; 1 brother 2043/04/12; 2 grandchildren; 2 GGC Lives with daughter  End of LIfe Care: - has living will - DNR/DNI, no heroic measures Tobacco Use - No.  Alcohol Use - no Drug Use - no  Review of Systems       The patient complains of shortness of breath.  The patient denies fatigue, malaise, fever, weight gain/loss, vision loss, decreased hearing, hoarseness, chest pain, palpitations, prolonged cough, wheezing, sleep apnea, coughing up blood, abdominal pain, blood in stool,  nausea, vomiting, diarrhea, heartburn, incontinence, blood in urine, muscle weakness, joint pain, leg swelling, rash, skin lesions, headache, fainting, dizziness,  depression, anxiety, enlarged lymph nodes, easy bruising or bleeding, and environmental allergies.    Vital Signs:  Patient profile:   77 year old female Height:      59 inches Weight:      134 pounds BMI:     27.16 Pulse rate:   79 / minute Resp:     16 per minute BP sitting:   131 / 59  (right arm)  Vitals Entered By: Marrion Coy, CNA (November 23, 2010 10:27 AM)  Physical Exam  General:  General: Well developed, well nourished, NAD HEENT: OP clear, mucus membranes moist SKIN: warm, dry Musculoskeletal: Muscle strength 5/5 all ext Psychiatric: Mood and affect normal Neck: No JVD, no carotid bruits, no thyromegaly, no lymphadenopathy. Lungs:Basilar crackles. No wheezes, rhonci, crackles CV: RRR with systolic murmur. No  gallops rubs Abdomen: soft, NT, ND, BS present Extremities: No edema, pulses 2+.    Echocardiogram  Procedure date:  11/19/2010  Findings:      Left ventricle: The cavity size was normal. Wall thickness was       normal. Systolic function was normal. The estimated ejection       fraction was in the range of 55% to 60%. Wall motion was normal;       there were no regional wall motion abnormalities. Features are       consistent with a pseudonormal left ventricular filling pattern,       with concomitant abnormal relaxation and increased filling       pressure (grade 2 diastolic dysfunction). E/medial e' > 15       suggests LV end diastolic pressure at least 20 mmHg.     - Aortic valve: There was no stenosis. Mild regurgitation.     - Mitral valve: Mildly calcified annulus. Moderately calcified       leaflets . Severe regurgitation. There does appear to be some       degree of prolapse of the posterior leaflet.     - Left atrium: The atrium was moderately to severely dilated.     - Right ventricle: The cavity size was normal. Systolic function was       normal.     - Atrial septum: Suspect PFO is present.     - Tricuspid valve: Peak RV-RA gradient:  40mm Hg (S).     - Pulmonary arteries: PA peak pressure: 50mm Hg (S).     - Systemic veins: IVC was not well-visualized. Will estimate RA       pressure at 10 mmHg.     Impressions:            - Normal LV size and systolic function, EF 55-60%. Moderate       diastolic dysfunction with evidence for elevated LV filling       pressure.Severe mitral regurgitation with calcified mitral valve       and some degree of posterior leaflet prolapse. Normal RV size and       systolic function. Moderate pulmonary hypertension.  EKG  Procedure date:  11/23/2010  Findings:      NSR, rate 79 bpm. Normal ekg.   Impression & Recommendations:  Problem # 1:  MITRAL REGURGITATION, SEVERE (ICD-396.3) Unchanged. She has severe MR. She is not an operative candidate given her age. LV systolic function is normal. Will increase Lasix to 40 mg by mouth two  times a day and also increase her K-Dur. Repeat BMET in one week. I will see her back in one month. I have discussed adding an Ace-inhibitor for afterload reduction in the past but she does not wish to start any new therapy. I think this is reasonable.   Other Orders: EKG w/ Interpretation (93000)  Patient Instructions: 1)  Your physician recommends that you schedule a follow-up appointment in 1 month. 2)  Your physician recommends that you return for lab work in 1 week for a BMP. 3)  Your physician has recommended you make the following change in your medication: INCREASE LASIX to 40mg  by mouth two times a day and INCREASE POTASSIUM to 40mg  by mouth daily.  Prescriptions: POTASSIUM CHLORIDE 20 MEQ PACK (POTASSIUM CHLORIDE) take one tablet by mouth two times a day.  #60 x 2   Entered by:   Whitney Maeola Sarah RN   Authorized by:   Verne Carrow, MD   Signed by:   Ellender Hose RN on 11/23/2010   Method used:   Electronically to        [facility]. [phone]* (retail)       [address]       [place],  [place]  [address]       Ph: [phone]       Fax: [phone]   RxID:   [id number] LASIX 40 MG TABS (FUROSEMIDE) Take 1 tablet by mouth two times a day.  #60 x 3   Entered by:   Whitney Maeola Sarah RN   Authorized by:   Verne Carrow, MD   Signed by:   Ellender Hose RN on 11/23/2010   Method used:   Electronically to        [facility]. [phone]* (retail)       [address]       [place], [place]  [address]       Ph: [phone]       Fax: [phone]   RxID:   [id number]   Appended Document: Orders Update    Clinical Lists Changes  Medications: Changed medication from POTASSIUM CHLORIDE 20 MEQ PACK (POTASSIUM CHLORIDE) take one tablet by mouth two times a day. to POTASSIUM CHLORIDE 40 MEQ/15ML (20%) LIQD (POTASSIUM CHLORIDE) take one daily by mouth. - Signed Rx of POTASSIUM CHLORIDE 40 MEQ/15ML (20%) LIQD (POTASSIUM CHLORIDE) take one daily by mouth.;  #30 x 1;  Signed;  Entered by: Whitney Maeola Sarah RN;  Authorized by: Verne Carrow, MD;  Method used: Electronically to [facility]. #[id number]*, [address] [place], [place], [place]  [address], Ph: [phone], Fax: [phone]    Prescriptions: POTASSIUM CHLORIDE 40 MEQ/15ML (20%) LIQD (POTASSIUM CHLORIDE) take one daily by mouth.  #30 x 1   Entered by:   Whitney Maeola Sarah RN   Authorized by:   Verne Carrow, MD   Signed by:   Ellender Hose RN on 11/23/2010   Method used:   Electronically to        [facility]. [phone]* (retail)       [address]       [place], [place]  [address]       Ph: [phone]       Fax: [phone]   RxID:   [id number]

## 2010-12-03 NOTE — Progress Notes (Signed)
   Phone Note Outgoing Call   Call placed by: Deliah Goody, RN,  November 20, 2010 3:15 PM Summary of Call: called pt with echo results, while on the phone the pt c/o increased SOB. she states she get more SOB when doing activities in the home or if ambulating. she denies any edema and has had no change in her weight. she requested to see dr Sanjuana Kava sooner than feb appt. follow up appt made Deliah Goody, RN  November 20, 2010 3:22 PM

## 2010-12-09 NOTE — Progress Notes (Signed)
Summary: pt daughter rtn call   Phone Note Call from Patient Call back at Home Phone [phone] Call back at [phone]   Caller: Daughter/Claudia Reason for Call: Talk to Nurse, Talk to Doctor Summary of Call: rtn call to get lab results and you can call either number and if she dosen't answer you can leave a message Initial call taken by: Omer Jack,  December 02, 2010 10:58 AM  Follow-up for Phone Call        patient aware of test results. Whitney Maeola Sarah RN  December 02, 2010 11:25 AM  Follow-up by: Whitney Maeola Sarah RN,  December 02, 2010 11:25 AM

## 2010-12-24 ENCOUNTER — Ambulatory Visit (INDEPENDENT_AMBULATORY_CARE_PROVIDER_SITE_OTHER): Payer: Medicare Other | Admitting: Cardiovascular Disease

## 2010-12-24 ENCOUNTER — Other Ambulatory Visit: Payer: Self-pay | Admitting: Cardiovascular Disease

## 2010-12-24 ENCOUNTER — Encounter: Payer: Self-pay | Admitting: Cardiovascular Disease

## 2010-12-24 DIAGNOSIS — I080 Rheumatic disorders of both mitral and aortic valves: Secondary | ICD-10-CM

## 2010-12-24 DIAGNOSIS — I5032 Chronic diastolic (congestive) heart failure: Secondary | ICD-10-CM

## 2010-12-24 LAB — BASIC METABOLIC PANEL
Chloride: 99 mEq/L (ref 96–112)
GFR: 39.07 mL/min — ABNORMAL LOW (ref >60.00)
Glucose, Bld: 83 mg/dL (ref 70–99)
Potassium: 4.1 mEq/L (ref 3.5–5.1)

## 2010-12-29 NOTE — Assessment & Plan Note (Signed)
Summary: ov per check out on 1/23.Julia KitchenMarland Kitchenper daughter call/lg  Medications Added POTASSIUM CHLORIDE 40 MEQ/15ML (20%) LIQD (POTASSIUM CHLORIDE) take 1 TBSP once daily LISINOPRIL 5 MG TABS (LISINOPRIL) Take one tablet by mouth daily      Allergies Added: NKDA  Visit Type:  Follow-up Primary Provider:  Norins  CC:  sob at times and pt has no other complaints today.[name]  History of Present Illness: [name] is a pleasant 77 year old Caucasian female with a past medical history significant for hyperlipidemia, GERD, and severe mitral valvular regurgitation as well as diastolic dysfunction, who was admitted  to [facility] and treated for E. coli urosepsis as well as acute on chronic heart failure in January 2010 and was discharged on Lasix therapy.  She has done well on Lasix therapy. Recent echo  grossly unchanged from prior. MR is severe. LA is dilated.  I saw her one month ago and she c/o increased dyspnea. I increased her Lasix to 40 mg by mouth two times a day. She is here for f/u. She seems to be doing slightly better. No LE edema. Some dyspnea with exertion.    Current Medications (verified): 1)  Coreg 25 Mg  Tabs (Carvedilol) .... Take One Tablet Twice Daily 2)  Aspirin 81 Mg  Tabs (Aspirin) .... Take One Tablet Once Daily 3)  Multivitamins   Tabs (Multiple Vitamin) .... Take 1 Tablet By Mouth Once A Day 4)  Ferrous Sulfate 325 (65 Fe) Mg  Tabs (Ferrous Sulfate) .... As Needed 5)  Lasix 40 Mg Tabs (Furosemide) .... Take 1 Tablet By Mouth Two Times A Day. 6)  Oscal 500/200 D-3 500-200 Mg-Unit  Tabs (Calcium-Vitamin D) .... Take 1 Tablet By Mouth Three Times A Day 7)  Potassium Chloride 40 Meq/42ml (20%) Liqd (Potassium Chloride) .... Take 1 Tbsp Once Daily 8)  Aleve 220 Mg Tabs (Naproxen Sodium) .... As Needed 9)  Preservision/lutein  Caps (Multiple Vitamins-Minerals) .... Take 1 Tablet By Mouth Two Times A Day 10)  Omega-3 Fish Oil 1000 Mg Caps (Omega-3 Fatty Acids) .... 2 Caps  At Bedtime  Allergies (verified): No Known Drug Allergies  Past History:  Past Medical History: Reviewed history from 06/12/2009 and no changes required. CORONARY ARTERY DISEASE (ICD-414.00) PERIPHERAL NEUROPATHY (ICD-356.9) AORTIC INSUFFICIENCY, MILD (ICD-424.1) CERUMEN IMPACTION, BILATERAL (ICD-380.4) UNSPECIFIED DIASTOLIC HEART FAILURE (ICD-428.30) HYPERLIPIDEMIA (ICD-272.4) MITRAL VALVE PROLAPSE (ICD-424.0) MITRAL REGURGITATION, SEVERE (ICD-396.3) UROSEPSIS (ICD-599.0) UNSPECIFIED ANEMIA (ICD-285.9) OSTEOPOROSIS (ICD-733.00) ADENOCARCINOMA, BREAST, HX OF (ICD-V10.3) Hx of IRRITABLE BLADDER (ICD-596.8) Hx of URINARY INCONTINENCE (ICD-788.30) HYPOTHYROIDISM, HX OF (ICD-V12.2) GASTROESOPHAGEAL REFLUX DISEASE (ICD-530.81) OSTEOARTHRITIS (ICD-715.90) APPENDECTOMY, HX OF (ICD-V45.79) Hx of WHOOPING COUGH (ICD-033.9) INTRAOCULAR LENS IMPLANT, BILATERAL, HX OF (ICD-V43.1) CATARACT EXTRACTIONS, BILATERAL, HX OF (ICD-V45.61) MASTECTOMY, LEFT, HX OF (ICD-V45.71) BREAST BIOPSY, HX OF (ICD-V15.9) FRACTURE, PATELLA REPAIR OF RIGHT KNEE (ICD-822.0) TONSILLECTOMY, HX OF (ICD-V45.79)    Social History: Reviewed history from 02/17/2010 and no changes required. HSG Married - 04-Apr-2038 - '04/03/23 2 daughters - 1 died at birth, '30; 1 brother April 05, 2043; 2 grandchildren; 2 GGC Lives with daughter  End of LIfe Care: - has living will - DNR/DNI, no heroic measures Tobacco Use - No.  Alcohol Use - no Drug Use - no  Review of Systems       The patient complains of shortness of breath.  The patient denies fatigue, malaise, fever, weight gain/loss, vision loss, decreased hearing, hoarseness, chest pain, palpitations, prolonged cough, wheezing, sleep apnea, coughing up blood, abdominal pain, blood in stool, nausea, vomiting, diarrhea,  heartburn, incontinence, blood in urine, muscle weakness, joint pain, leg swelling, rash, skin lesions, headache, fainting, dizziness, depression, anxiety, enlarged lymph nodes,  easy bruising or bleeding, and environmental allergies.    Vital Signs:  Patient profile:   77 year old female Height:      59 inches Weight:      132.50 pounds Resp:     18 per minute BP sitting:   124 / 62  (left arm) Cuff size:   regular  Vitals Entered By: Celestia Khat, CMA (December 24, 2010 2:30 PM)  Physical Exam  General:  General: Well developed, well nourished, NAD HEENT: OP clear, mucus membranes moist Musculoskeletal: Muscle strength 5/5 all ext Psychiatric: Mood and affect normal Neck: No JVD, no carotid bruits, no thyromegaly, no lymphadenopathy. Lungs:Clear bilaterally, no wheezes, rhonci, crackles CV: RRR with systolic  murmur. No gallops rubs Abdomen: soft, NT, ND, BS present Extremities: No edema, pulses 2+.    Impression & Recommendations:  Problem # 1:  MITRAL REGURGITATION, SEVERE (ICD-396.3) Stable. Will add Lisinopril 5 mg by mouth Qdaily for afterload reduction. I have thought about this for the last year but she hasn't wanted to add medications. No other changes. Check BMET today and in two weeks. She will continue following her BP at home. If it drops on new med, will cut Coreg in half to 12. 5mg  by mouth two times a day.   Other Orders: TLB-BMP (Basic Metabolic Panel-BMET) (80048-METABOL)  Patient Instructions: 1)  Your physician recommends that you schedule a follow-up appointment in: 6 months 2)  Your physician recommends that you return for lab work in 2 weeks-- January 07, 2011 3)  Your physician has recommended you make the following change in your medication: Start Lisinopril 5 mg by mouth daily. Prescriptions: LISINOPRIL 5 MG TABS (LISINOPRIL) Take one tablet by mouth daily  #30 x 11   Entered by:   Dossie Arbour, RN, BSN   Authorized by:   Verne Carrow, MD   Signed by:   Dossie Arbour, RN, BSN on 12/24/2010   Method used:   Electronically to        [facility]. [phone]* (retail)       [address]       [place], [place]  [address]       Ph: [phone]       Fax: [phone]   RxID:   [id number]

## 2011-01-06 ENCOUNTER — Telehealth: Payer: Self-pay | Admitting: Cardiovascular Disease

## 2011-01-07 ENCOUNTER — Other Ambulatory Visit: Payer: Medicare Other

## 2011-01-12 NOTE — Progress Notes (Signed)
Summary: Problems with BP medication *  Medications Added LISINOPRIL 5 MG TABS (LISINOPRIL) Take one tablet by mouth daily- ON HOLD.       Phone Note Call from Patient   Caller: Daughter Call For: nurse  Follow-up for Phone Call        Patient's daughter states patient has been feeling lethargic since starting Lisinopril. After starting BP was 100/46 that evening. On 12/26/10- 99/46 in the am and 92/42 in the evening.  Pt. also c/o GI upset after taking Lisinopril. She stopped taking the medication b/c it made her feel so bad & decided to take it Sunday night & felt bad again with the GI upset. I will forward to Dr. Clifton James for review. She will hold Lisinopril now & take Coreg as prescribed. BP this am 116/57. Whitney Maeola Sarah RN  January 06, 2011 12:50 PM  Follow-up by: Whitney Maeola Sarah RN,  January 06, 2011 12:50 PM  Additional Follow-up for Phone Call Additional follow up Details #1::        OK to stop Lisinopril. cdm Additional Follow-up by: Verne Carrow, MD,  January 06, 2011 4:28 PM    New/Updated Medications: LISINOPRIL 5 MG TABS (LISINOPRIL) Take one tablet by mouth daily- ON HOLD.

## 2011-01-13 LAB — BASIC METABOLIC PANEL
BUN: 29 mg/dL — ABNORMAL HIGH (ref 6–23)
Calcium: 9.2 mg/dL (ref 8.4–10.5)
Creatinine, Ser: 1.3 mg/dL — ABNORMAL HIGH (ref 0.4–1.2)
GFR calc Af Amer: 47 mL/min — ABNORMAL LOW (ref >60)
GFR calc non Af Amer: 38 mL/min — ABNORMAL LOW (ref >60)
Sodium: 139 mEq/L (ref 135–145)

## 2011-01-13 LAB — URINE CULTURE
Culture  Setup Time: 201110180808
Culture: NO GROWTH

## 2011-01-13 LAB — URINE MICROSCOPIC-ADD ON

## 2011-01-13 LAB — DIFFERENTIAL
Basophils Absolute: 0 10*3/uL (ref 0.0–0.1)
Eosinophils Absolute: 0.3 10*3/uL (ref 0.0–0.7)
Eosinophils Relative: 3 % (ref 0–5)
Monocytes Absolute: 1 10*3/uL (ref 0.1–1.0)
Neutro Abs: 6.7 10*3/uL (ref 1.7–7.7)
Neutrophils Relative %: 67 % (ref 43–77)

## 2011-01-13 LAB — CBC
Hemoglobin: 10.9 g/dL — ABNORMAL LOW (ref 12.0–15.0)
MCV: 94.1 fL (ref 78.0–100.0)
Platelets: 144 10*3/uL — ABNORMAL LOW (ref 150–400)
RBC: 3.44 MIL/uL — ABNORMAL LOW (ref 3.87–5.11)
RDW: 13.9 % (ref 11.5–15.5)

## 2011-01-13 LAB — URINALYSIS, ROUTINE W REFLEX MICROSCOPIC
Nitrite: NEGATIVE
Urobilinogen, UA: 1 mg/dL (ref 0.0–1.0)

## 2011-01-13 LAB — APTT: aPTT: 33 seconds (ref 24–37)

## 2011-02-04 ENCOUNTER — Other Ambulatory Visit: Payer: Self-pay | Admitting: *Deleted

## 2011-02-04 MED ORDER — POTASSIUM CHLORIDE 40 MEQ/15ML (20%) PO LIQD: ORAL | Status: DC

## 2011-02-05 ENCOUNTER — Emergency Department (HOSPITAL_COMMUNITY): Payer: Medicare Other

## 2011-02-05 ENCOUNTER — Inpatient Hospital Stay (HOSPITAL_COMMUNITY)
Admission: EM | Admit: 2011-02-05 | Discharge: 2011-02-08 | DRG: 194 | Disposition: A | Discharge status: Home / Self Care | Payer: Medicare Other | Attending: Internal Medicine | Admitting: Internal Medicine

## 2011-02-05 DIAGNOSIS — E785 Hyperlipidemia, unspecified: Secondary | ICD-10-CM | POA: Diagnosis present

## 2011-02-05 DIAGNOSIS — N390 Urinary tract infection, site not specified: Secondary | ICD-10-CM | POA: Diagnosis present

## 2011-02-05 DIAGNOSIS — D72829 Elevated white blood cell count, unspecified: Secondary | ICD-10-CM | POA: Diagnosis present

## 2011-02-05 DIAGNOSIS — I279 Pulmonary heart disease, unspecified: Secondary | ICD-10-CM | POA: Diagnosis present

## 2011-02-05 DIAGNOSIS — J189 Pneumonia, unspecified organism: Principal | ICD-10-CM | POA: Diagnosis present

## 2011-02-05 DIAGNOSIS — Z66 Do not resuscitate: Secondary | ICD-10-CM | POA: Diagnosis present

## 2011-02-05 DIAGNOSIS — Z853 Personal history of malignant neoplasm of breast: Secondary | ICD-10-CM

## 2011-02-05 DIAGNOSIS — Z7982 Long term (current) use of aspirin: Secondary | ICD-10-CM

## 2011-02-05 DIAGNOSIS — K219 Gastro-esophageal reflux disease without esophagitis: Secondary | ICD-10-CM | POA: Diagnosis present

## 2011-02-05 DIAGNOSIS — E876 Hypokalemia: Secondary | ICD-10-CM | POA: Diagnosis present

## 2011-02-05 DIAGNOSIS — I059 Rheumatic mitral valve disease, unspecified: Secondary | ICD-10-CM | POA: Diagnosis present

## 2011-02-05 LAB — COMPREHENSIVE METABOLIC PANEL WITH GFR
ALT: 11 U/L (ref 0–35)
AST: 23 U/L (ref 0–37)
Alkaline Phosphatase: 70 U/L (ref 39–117)
CO2: 30 meq/L (ref 19–32)
Chloride: 103 meq/L (ref 96–112)
GFR calc Af Amer: 58 mL/min — ABNORMAL LOW (ref >60)
GFR calc non Af Amer: 48 mL/min — ABNORMAL LOW (ref >60)
Glucose, Bld: 117 mg/dL — ABNORMAL HIGH (ref 70–99)
Potassium: 4 meq/L (ref 3.5–5.1)
Sodium: 139 meq/L (ref 135–145)
Total Bilirubin: 0.9 mg/dL (ref 0.3–1.2)

## 2011-02-05 LAB — URINALYSIS, ROUTINE W REFLEX MICROSCOPIC
Bilirubin Urine: NEGATIVE
Glucose, UA: NEGATIVE mg/dL
Hgb urine dipstick: NEGATIVE
Ketones, ur: NEGATIVE mg/dL
Nitrite: NEGATIVE
Protein, ur: NEGATIVE mg/dL
Specific Gravity, Urine: 1.013 (ref 1.005–1.030)
Urobilinogen, UA: 0.2 mg/dL (ref 0.0–1.0)
pH: 6.5 (ref 5.0–8.0)

## 2011-02-05 LAB — DIFFERENTIAL
Basophils Absolute: 0 10*3/uL (ref 0.0–0.1)
Basophils Relative: 0 % (ref 0–1)
Eosinophils Absolute: 0.1 10*3/uL (ref 0.0–0.7)
Eosinophils Relative: 1 % (ref 0–5)
Lymphocytes Relative: 9 % — ABNORMAL LOW (ref 12–46)
Lymphs Abs: 1.3 K/uL (ref 0.7–4.0)
Monocytes Absolute: 0.9 10*3/uL (ref 0.1–1.0)
Monocytes Relative: 6 % (ref 3–12)
Neutro Abs: 13.1 K/uL — ABNORMAL HIGH (ref 1.7–7.7)
Neutrophils Relative %: 85 % — ABNORMAL HIGH (ref 43–77)

## 2011-02-05 LAB — POCT CARDIAC MARKERS
CKMB, poc: 1.1 ng/mL (ref 1.0–8.0)
Myoglobin, poc: 198 ng/mL (ref 12–200)
Troponin i, poc: <0.05 ng/mL (ref 0.00–0.09)

## 2011-02-05 LAB — COMPREHENSIVE METABOLIC PANEL
Albumin: 3.2 g/dL — ABNORMAL LOW (ref 3.5–5.2)
BUN: 17 mg/dL (ref 6–23)
Calcium: 9.3 mg/dL (ref 8.4–10.5)
Creatinine, Ser: 1.08 mg/dL (ref 0.4–1.2)
Total Protein: 7.1 g/dL (ref 6.0–8.3)

## 2011-02-05 LAB — CBC
HCT: 33.9 % — ABNORMAL LOW (ref 36.0–46.0)
Hemoglobin: 11.1 g/dL — ABNORMAL LOW (ref 12.0–15.0)
MCH: 30 pg (ref 26.0–34.0)
MCHC: 32.7 g/dL (ref 30.0–36.0)
MCV: 91.6 fL (ref 78.0–100.0)
Platelets: 162 10*3/uL (ref 150–400)
RBC: 3.7 MIL/uL — ABNORMAL LOW (ref 3.87–5.11)
RDW: 15.4 % (ref 11.5–15.5)
WBC: 15.4 10*3/uL — ABNORMAL HIGH (ref 4.0–10.5)

## 2011-02-05 LAB — STREP PNEUMONIAE URINARY ANTIGEN: Strep Pneumo Urinary Antigen: NEGATIVE

## 2011-02-05 LAB — BRAIN NATRIURETIC PEPTIDE: Pro B Natriuretic peptide (BNP): 515 pg/mL — ABNORMAL HIGH (ref 0.0–100.0)

## 2011-02-06 DIAGNOSIS — J159 Unspecified bacterial pneumonia: Secondary | ICD-10-CM

## 2011-02-06 DIAGNOSIS — I059 Rheumatic mitral valve disease, unspecified: Secondary | ICD-10-CM

## 2011-02-06 DIAGNOSIS — I509 Heart failure, unspecified: Secondary | ICD-10-CM

## 2011-02-06 DIAGNOSIS — N390 Urinary tract infection, site not specified: Secondary | ICD-10-CM

## 2011-02-06 LAB — BASIC METABOLIC PANEL
CO2: 29 mEq/L (ref 19–32)
Calcium: 8.8 mg/dL (ref 8.4–10.5)
Chloride: 101 mEq/L (ref 96–112)
GFR calc Af Amer: 53 mL/min — ABNORMAL LOW (ref >60)
Sodium: 137 mEq/L (ref 135–145)

## 2011-02-06 LAB — CBC
Hemoglobin: 10 g/dL — ABNORMAL LOW (ref 12.0–15.0)
MCHC: 32.3 g/dL (ref 30.0–36.0)
Platelets: 149 10*3/uL — ABNORMAL LOW (ref 150–400)
RBC: 3.4 MIL/uL — ABNORMAL LOW (ref 3.87–5.11)

## 2011-02-07 ENCOUNTER — Inpatient Hospital Stay (HOSPITAL_COMMUNITY): Payer: Medicare Other

## 2011-02-07 LAB — BASIC METABOLIC PANEL
CO2: 30 mEq/L (ref 19–32)
Chloride: 99 mEq/L (ref 96–112)
Creatinine, Ser: 1.15 mg/dL (ref 0.4–1.2)
GFR calc Af Amer: 54 mL/min — ABNORMAL LOW (ref >60)
Potassium: 3.2 mEq/L — ABNORMAL LOW (ref 3.5–5.1)

## 2011-02-07 LAB — URINE CULTURE
Colony Count: NO GROWTH
Culture  Setup Time: 201204070053
Culture: NO GROWTH

## 2011-02-07 LAB — BRAIN NATRIURETIC PEPTIDE: Pro B Natriuretic peptide (BNP): 289 pg/mL — ABNORMAL HIGH (ref 0.0–100.0)

## 2011-02-07 LAB — CBC
Hemoglobin: 9.6 g/dL — ABNORMAL LOW (ref 12.0–15.0)
MCH: 29.9 pg (ref 26.0–34.0)
RBC: 3.21 MIL/uL — ABNORMAL LOW (ref 3.87–5.11)
WBC: 9.5 10*3/uL (ref 4.0–10.5)

## 2011-02-08 LAB — BASIC METABOLIC PANEL
CO2: 31 mEq/L (ref 19–32)
Chloride: 102 mEq/L (ref 96–112)
GFR calc Af Amer: >60 mL/min (ref >60)
Glucose, Bld: 101 mg/dL — ABNORMAL HIGH (ref 70–99)
Potassium: 3.9 mEq/L (ref 3.5–5.1)
Sodium: 139 mEq/L (ref 135–145)

## 2011-02-15 LAB — DIFFERENTIAL
Basophils Absolute: 0 10*3/uL (ref 0.0–0.1)
Basophils Absolute: 0.3 10*3/uL — ABNORMAL HIGH (ref 0.0–0.1)
Basophils Relative: 0 % (ref 0–1)
Eosinophils Absolute: 0.1 10*3/uL (ref 0.0–0.7)
Eosinophils Relative: 0 % (ref 0–5)
Lymphocytes Relative: 3 % — ABNORMAL LOW (ref 12–46)
Lymphs Abs: 0.4 10*3/uL — ABNORMAL LOW (ref 0.7–4.0)
Monocytes Absolute: 1.5 10*3/uL — ABNORMAL HIGH (ref 0.1–1.0)
Neutro Abs: 23.8 10*3/uL — ABNORMAL HIGH (ref 1.7–7.7)
Neutrophils Relative %: 87 % — ABNORMAL HIGH (ref 43–77)
Neutrophils Relative %: 94 % — ABNORMAL HIGH (ref 43–77)

## 2011-02-15 LAB — CBC
HCT: 29.1 % — ABNORMAL LOW (ref 36.0–46.0)
HCT: 32.2 % — ABNORMAL LOW (ref 36.0–46.0)
Hemoglobin: 10.8 g/dL — ABNORMAL LOW (ref 12.0–15.0)
MCHC: 33.3 g/dL (ref 30.0–36.0)
MCHC: 33.9 g/dL (ref 30.0–36.0)
MCV: 95.5 fL (ref 78.0–100.0)
Platelets: 129 10*3/uL — ABNORMAL LOW (ref 150–400)
Platelets: 138 10*3/uL — ABNORMAL LOW (ref 150–400)
Platelets: 166 10*3/uL (ref 150–400)
RBC: 3.34 MIL/uL — ABNORMAL LOW (ref 3.87–5.11)
RDW: 14 % (ref 11.5–15.5)
RDW: 14.2 % (ref 11.5–15.5)
WBC: 12.3 10*3/uL — ABNORMAL HIGH (ref 4.0–10.5)
WBC: 14.8 10*3/uL — ABNORMAL HIGH (ref 4.0–10.5)

## 2011-02-15 LAB — CULTURE, BLOOD (ROUTINE X 2)

## 2011-02-15 LAB — COMPREHENSIVE METABOLIC PANEL
ALT: 17 U/L (ref 0–35)
AST: 26 U/L (ref 0–37)
CO2: 26 mEq/L (ref 19–32)
Calcium: 8.7 mg/dL (ref 8.4–10.5)
Chloride: 103 mEq/L (ref 96–112)
Creatinine, Ser: 1.04 mg/dL (ref 0.4–1.2)
GFR calc Af Amer: >60 mL/min (ref >60)
GFR calc non Af Amer: 50 mL/min — ABNORMAL LOW (ref >60)
Glucose, Bld: 122 mg/dL — ABNORMAL HIGH (ref 70–99)
Total Bilirubin: 1.4 mg/dL — ABNORMAL HIGH (ref 0.3–1.2)

## 2011-02-15 LAB — POCT CARDIAC MARKERS: Myoglobin, poc: 164 ng/mL (ref 12–200)

## 2011-02-15 LAB — BASIC METABOLIC PANEL
BUN: 14 mg/dL (ref 6–23)
Calcium: 8.3 mg/dL — ABNORMAL LOW (ref 8.4–10.5)
Creatinine, Ser: 1.04 mg/dL (ref 0.4–1.2)
GFR calc Af Amer: 55 mL/min — ABNORMAL LOW (ref >60)
GFR calc Af Amer: >60 mL/min (ref >60)
GFR calc non Af Amer: 45 mL/min — ABNORMAL LOW (ref >60)
GFR calc non Af Amer: 50 mL/min — ABNORMAL LOW (ref >60)
Potassium: 3.9 mEq/L (ref 3.5–5.1)
Potassium: 4 mEq/L (ref 3.5–5.1)
Sodium: 136 mEq/L (ref 135–145)

## 2011-02-15 LAB — CK TOTAL AND CKMB (NOT AT ARMC)
CK, MB: 2 ng/mL (ref 0.3–4.0)
Relative Index: INVALID (ref 0.0–2.5)
Total CK: 65 U/L (ref 7–177)

## 2011-02-15 LAB — URINE CULTURE

## 2011-02-15 LAB — BRAIN NATRIURETIC PEPTIDE
Pro B Natriuretic peptide (BNP): 510 pg/mL — ABNORMAL HIGH (ref 0.0–100.0)
Pro B Natriuretic peptide (BNP): 784 pg/mL — ABNORMAL HIGH (ref 0.0–100.0)

## 2011-02-15 LAB — URINALYSIS, ROUTINE W REFLEX MICROSCOPIC
Bilirubin Urine: NEGATIVE
Ketones, ur: NEGATIVE mg/dL
Nitrite: POSITIVE — AB
Protein, ur: 30 mg/dL — AB
Urobilinogen, UA: 1 mg/dL (ref 0.0–1.0)

## 2011-02-15 LAB — CARDIAC PANEL(CRET KIN+CKTOT+MB+TROPI)
Relative Index: INVALID (ref 0.0–2.5)
Total CK: 70 U/L (ref 7–177)
Troponin I: 0.03 ng/mL (ref 0.00–0.06)

## 2011-02-15 LAB — PROTIME-INR
INR: 1.2 (ref 0.00–1.49)
Prothrombin Time: 15.2 seconds (ref 11.6–15.2)

## 2011-02-15 LAB — APTT: aPTT: 37 seconds (ref 24–37)

## 2011-02-15 LAB — MAGNESIUM: Magnesium: 2.2 mg/dL (ref 1.5–2.5)

## 2011-02-17 ENCOUNTER — Other Ambulatory Visit: Payer: Self-pay | Admitting: *Deleted

## 2011-02-17 MED ORDER — CARVEDILOL 25 MG PO TABS: 25.0000 mg | ORAL_TABLET | Freq: Two times a day (BID) | ORAL | Status: DC

## 2011-02-19 ENCOUNTER — Other Ambulatory Visit (INDEPENDENT_AMBULATORY_CARE_PROVIDER_SITE_OTHER): Payer: Medicare Other

## 2011-02-19 ENCOUNTER — Ambulatory Visit (INDEPENDENT_AMBULATORY_CARE_PROVIDER_SITE_OTHER): Payer: Medicare Other | Admitting: Internal Medicine

## 2011-02-19 DIAGNOSIS — J189 Pneumonia, unspecified organism: Secondary | ICD-10-CM

## 2011-02-19 DIAGNOSIS — N390 Urinary tract infection, site not specified: Secondary | ICD-10-CM

## 2011-02-19 DIAGNOSIS — I5032 Chronic diastolic (congestive) heart failure: Secondary | ICD-10-CM

## 2011-02-19 DIAGNOSIS — I509 Heart failure, unspecified: Secondary | ICD-10-CM

## 2011-02-19 DIAGNOSIS — R0602 Shortness of breath: Secondary | ICD-10-CM

## 2011-02-19 LAB — URINALYSIS, ROUTINE W REFLEX MICROSCOPIC
Hgb urine dipstick: NEGATIVE
Nitrite: NEGATIVE
Urine Glucose: NEGATIVE
Urobilinogen, UA: 0.2 (ref 0.0–1.0)

## 2011-02-19 LAB — CBC WITH DIFFERENTIAL/PLATELET
Basophils Absolute: 0.1 10*3/uL (ref 0.0–0.1)
Eosinophils Absolute: 0.3 10*3/uL (ref 0.0–0.7)
Lymphocytes Relative: 34 % (ref 12.0–46.0)
MCHC: 33.8 g/dL (ref 30.0–36.0)
Neutrophils Relative %: 50.3 % (ref 43.0–77.0)
RDW: 15.8 % — ABNORMAL HIGH (ref 11.5–14.6)

## 2011-02-19 LAB — COMPREHENSIVE METABOLIC PANEL
ALT: 13 U/L (ref 0–35)
AST: 23 U/L (ref 0–37)
Albumin: 3.2 g/dL — ABNORMAL LOW (ref 3.5–5.2)
Calcium: 9.6 mg/dL (ref 8.4–10.5)
Chloride: 95 mEq/L — ABNORMAL LOW (ref 96–112)
Potassium: 4.8 mEq/L (ref 3.5–5.1)

## 2011-02-19 NOTE — Progress Notes (Signed)
  Subjective:    Patient ID: [name], female    DOB: 1918/06/28, 77 y.o.   MRN: [id number]  HPI The patient is here for annual Medicare wellness examination and management of other chronic and acute problems.   The risk factors are reflected in the social history.  The roster of all physicians providing medical care to patient - is listed in the Snapshot section of the chart.  Activities of daily living:  The patient is 100% inedpendent in all ADLs: dressing, toileting, feeding as well as independent mobility  Home safety : The patient has smoke detectors in the home. They wear seatbelts. firearms at home ( firearms are present in the home, kept in a safe fashion). There is no violence in the home.   There is no risks for hepatitis, STDs or HIV. There is no   history of blood transfusion. They have no travel history to infectious disease endemic areas of the world.  The patient has seen their dentist in the last six month. They have not seen their eye doctor in the last year. They admit to hearing difficulty and have had audiologic testing in the last year.  They do not  have excessive sun exposure. Discussed the need for sun protection: hats, long sleeves and use of sunscreen if there is significant sun exposure.   Diet: the importance of a healthy diet is discussed. They do have a healthy (unhealthy-high fat/fast food) diet.      Review of Systems     Objective:   Physical Exam        Assessment & Plan:

## 2011-02-19 NOTE — Progress Notes (Signed)
  Subjective:    Patient ID: [name], female    DOB: 1918/01/26, 77 y.o.   MRN: [id number]  HPIMrs. Rainwater presents for follow-up after hospitalization for CAP. D/C summary and hospital data reviewed. During that stay she had a BNP of 515 that was down to 289 after increased diuresis. She had a 2 D Echo in Jan '12 with grade 2 diastolic dysfunction. At her last visit with Dr. Sanjuana Kava she was started on low dose lisinopril but she did not tolerate this. At hospital discharge she was kept on her previous medications. Since discharge she has been doing pretty well but does have some increased SOB/DOE. She denies chest pain, diaphoresis, productive cough    Review of Systems  Constitutional: Positive for fatigue. Negative for fever, activity change and unexpected weight change.  HENT: Negative.   Eyes: Negative.   Respiratory: Positive for cough and shortness of breath. Negative for apnea, chest tightness and wheezing.   Cardiovascular: Negative.  Negative for chest pain and palpitations.  Hematological: Negative for adenopathy.  All other systems reviewed and are negative.       Objective:   Physical Exam WNWD overweight white woman in no acute distress HEENT - nl Chest - mild kyphosis. Lungs - good air movement, no rales, wheezes or rhonchi Cor - RRR       Assessment & Plan:  1. CAP - good recovery. She has no evidence of persistent respiratory infection: no fever, sputum production.   2. CHF - known diastolic dysfunction with decompensation during last hospital stay. She remains mildly short of breath with some DOE. Exam is normal  Plan - continue present regimen - she has not resumed low dose lisinopril            Follow-up with Dr. Sanjuana Kava in June - appointment scheduled, patient and daughter aware.            Call for any increased shortness of breath.

## 2011-02-19 NOTE — Patient Instructions (Signed)
Pneumonia - it appears that you are making an excellent recovery. May still be a bit a weak. You must be patient  Diastolic heart failure - the heart is a little stiff which reduces efficiency. Treatment is good blood pressure control and diuretics. You seem to be ok but there will be some increased shortness of breath with exertion. Plan - lab today. Appointment with Dr. Sanjuana Kava June 11th.  UTI - will follow-up on UTI

## 2011-02-21 ENCOUNTER — Encounter: Payer: Self-pay | Admitting: Internal Medicine

## 2011-02-21 NOTE — Discharge Summary (Signed)
[name], [name]             ACCOUNT NO.:  [id number]  MEDICAL RECORD NO.:  [id number]           PATIENT TYPE:  I  LOCATION:  5508                         FACILITY:  [facility]  PHYSICIAN:  Rosalyn Gess. Norins, MD  DATE OF BIRTH:  05-Jun-1918  DATE OF ADMISSION:  02/05/2011 DATE OF DISCHARGE:  02/08/2011                              DISCHARGE SUMMARY   ADMITTING DIAGNOSES: 1. Community-acquired pneumonia. 2. Congestive heart failure. 3. Severe mitral regurgitation. 4. Recent urinary tract infection.  DISCHARGE DIAGNOSES: 1. Community-acquired pneumonia. 2. Congestive heart failure. 3. Severe mitral regurgitation. 4. Recent urinary tract infection.  CONSULTANTS:  None.  PROCEDURES: 1. Chest x-ray 2-view April 6 which showed cardiomegaly, vascular     congestion, right upper lobe patchy airspace disease worrisome for     inflammatory process, small pleural effusion on the left. 2. Chest x-ray April 8, 2-view, which showed slight increased     pulmonary vascularity, interstitial accentuation.  No infiltrate     was reported out.  HISTORY OF PRESENT ILLNESS:  Ms. Murillo is a delightful 77 year old woman with a past medical history of severe mitral regurgitation and pulmonary hypertension along with systolic dysfunction.  She presented to the emergency department complaining of shortness of breath.  The patient had baseline shortness of breath but this was worse in her baseline.  Over the 4 days prior to admission, she had been having progressive shortness of breath with exertion.  In the emergency department, the patient did report low-grade fever to a max of 99.9. The patient had been seen several days prior to admission by Dr. Seymour Bars for GYN for repositioning of a vaginal pessary.  She had a urinalysis at that time and was started on Macrobid for UTI.  Please see H&P for past medical history, family history, social history, and admission exam.  HOSPITAL COURSE: 1. ID.   The patient with community-acquired pneumonia, was started on     Rocephin and azithromycin.  She had an initial white count of     15,400 with 85% segs, 9% lymphs, 6% monos, and 1% eosinophils,     infiltrate on chest x-ray.  The patient had a strep pneumo urine     antigen that was negative.  The patient was continued on Rocephin     and azithromycin and did very well.  White count came back to     normal at 9500 on February 07, 2011.  The patient had no fever.  She     had no cough or productive sputum.  It was felt that her pneumonia     was well on the awaited resolution and could be treated at home to     complete oral antibiotics. 2. Cardiovascular:  The patient was admitted with an elevated BNP and     shortness of breath.  Chest x-ray revealed interstitial edema.  BNP     was 515.  The patient was treated with increased doses of     diuretics.  Final BNP on April 8 was down to 289.  The patient     continued to have some mild hypoxemia with a room  air oxygen     saturation of 91%.  She did ambulate on the 8th and did report some     shortness of breath with exertion but close to her baseline.  With     the patient showing improvement in regards to oxygenation and     decreased shortness of breath, at this point she is ready for     discharge to home.  We will continue on her home regimen. 3. Hypokalemia.  The patient was orally replaced and by the day of     discharge, her potassium was normal at 3.9.  [name] does seem medically stable at this time.  She will continue on oral antibiotics using Ceftin 250 mg b.i.d. for additional 5 days.  She will resume her home regimen in terms of medications.  Her prognosis is guarded because of her multiple medical problems and her advanced age, however, she does appear stable at this time.  DISPOSITION:  The patient is discharged to home.  She will be seen in the office in followup on April 20, sooner on an as needed  basis.  The patient's condition at time of discharge dictation is stable and improved.     Rosalyn Gess Norins, MD     MEN/MEDQ  D:  02/08/2011  T:  02/08/2011  Job:  [id number]  Electronically Signed by Illene Regulus MD on 02/21/2011 05:45:34 PM

## 2011-02-22 ENCOUNTER — Telehealth: Payer: Self-pay

## 2011-02-22 NOTE — H&P (Signed)
[name], NOP NO.:  [id number]  MEDICAL RECORD NO.:  [id number]           PATIENT TYPE:  E  LOCATION:  [facility]                         FACILITY:  [facility]  PHYSICIAN:  Mariea Stable, MD   DATE OF BIRTH:  [birth year]  DATE OF ADMISSION:  02/05/2011 DATE OF DISCHARGE:                             HISTORY & PHYSICAL   PRIMARY CARE PHYSICIAN:  Rosalyn Gess. Norins, MD  CARDIOLOGY:  Verne Carrow, MD  CHIEF COMPLAINT:  Progressive shortness of breath.  HISTORY OF PRESENT ILLNESS:  [name] is a 77 year old woman with past medical history significant for severe mitral regurgitation and pulmonary hypertension along with some diastolic dysfunction, who presents with chief complaint of worsening shortness of breath.  The patient has baseline shortness of breath, although this is mainly with exertion and resolve with rest.  However over the last 4 days, she had been having progressive shortness of breath with exertion to the point where the shortness of breath continues at rest.  This has been ongoing and progressive for approximately the last 4-5 days.  However, the patient denies any other symptoms such as chest pain, palpitations, and cough.  They do report some low-grade temperatures, high as 99.9 taken earlier today.  On review, the patient had some urinary urgency earlier this week and was seen by Dr. Seymour Bars, from GYN.  The patient has a vaginal pessary that was placed few years ago when she gets checked every couple of weeks, where the pessary gets wash and replaced.  At that time, they did a urine analysis with a urine culture.  The patient was prescribed Macrobid for UTI, which she started 2 days ago.  PAST MEDICAL HISTORY: 1. Severe mitral regurgitation to be managed medically. 2. Diastolic dysfunction. 3. Hyperlipidemia. 4. GERD. 5. History of breast cancer with prior mastectomy and chemotherapy.  MEDICATIONS: 1. Omega-3 fatty acids 1 g  2 capsules p.o. at bedtime 2. Aleve 220 mg p.o. b.i.d. p.r.n. 3. Multivitamin 1 tablet p.o. daily. 4. Os-Cal 500 mg with vitamin D 1 tablet p.o. t.i.d. 5. Potassium chloride liquid 40 mEq per 15 mL 1 tablespoon p.o. q.a.m. 6. Preservation over-the-counter 1 tablet p.o. b.i.d. 7. Aspirin 81 mg p.o. daily. 8. Lasix 40 mg p.o. b.i.d. 9. Coreg 25 mg p.o. b.i.d. 10.Macrobid 100 mg p.o. b.i.d. 11.Mucinex 600 mg p.o. daily p.r.n.  ALLERGIES:  NO KNOWN DRUG ALLERGIES.  SOCIAL HISTORY:  The patient currently lives with her daughter, [name].  She is the one to be contacted if needed and her cell phone number is [phone], home phone number is [phone] that is cell phone and home respectively.  The patient has never smoked.  She denies any alcohol or drug use.  Of note, the patient is a DNR.  FAMILY HISTORY:  Noncontributory.  REVIEW OF SYSTEMS:  As per HPI.  All others reviewed and negative.  PHYSICAL EXAMINATION:  VITAL SIGNS:  T-max of 100.0 in the emergency department, blood pressure 122/57, heart rate of 94, respirations 20, and oxygen saturation 94% on room air. GENERAL:  This is an elderly very pleasant woman, lying in bed, in no acute  distress. HEENT:  Head is normocephalic and atraumatic.  Pupils are equally round and reactive to light and accommodation.  Extraocular movements were intact.  Sclerae anicteric.  Mucous membranes are slightly dry. NECK:  Supple.  There is no JVD.  There is no carotid bruits. HEART:  There is an S1-S2 with a grade 3-4/6 systolic murmur best appreciated at the apex.  There are no gallops or rubs. LUNGS:  There is fair air movement bilaterally.  There are diffuse crackles bilaterally in the lower half of the lungs.  There are no rhonchi or wheezes. ABDOMEN:  Positive bowel sounds.  Soft, nontender, and nondistended.  No guarding. EXTREMITIES:  There is no cyanosis, clubbing, or edema. NEUROLOGIC:  The patient is awake, alert, and oriented  x3.  Cranial nerves II through XII were intact.  She is moving all extremities. Sensation is intact. PSYCHIATRIC:  The patient is very pleasant and appropriate.  LABORATORY DATA:  WBC 15.4, hemoglobin 11.1, and platelets 162.  Point- of-care cardiac enzymes were negative with a CK-MB 1.1.  Troponin-I less than 0.05.  Myoglobin of 198.  Sodium 139, potassium 4.0, chloride 103, bicarb is 30, glucose 117, BUN 17, and creatinine 108.  LFTs are within normal limits.  BNP 515.  Urinalysis is negative.  IMAGING:  Chest x-ray.  Impression, cardiomegaly, vascular congestion, right upper lobe patchy air space disease worrisome for promontory process and small left pleural effusion.  ASSESSMENT AND PLAN: 1. Shortness of breath.  Given the current data available with a low-     grade temperature of 100, leukocytosis of 15,000, and chest x-ray     with right upper lobe infiltrate, this is most likely secondary to     community-acquired pneumonia.  I will go ahead and admit and the     patient to regular bed and treat with Rocephin and Zithromax     empirically.  The patient does not have a cough or producing sputum    and therefore will not order a sputum culture, as this will likely     be oropharyngeal flora.  We will go ahead and check a urine     Legionella antigen as well as a urine strep antigen.  The patient     does have bilateral crackles up to mid lung fields with a slightly     elevated BNP of 515 (which appears to be her baseline) and does     also has some vascular congestion on chest x-ray in the setting of     severe mitral regurg.  We will continue her home dose of Lasix as     her weight is basically at her baseline. 2. Severe mitral regurgitation.  Per cardiology notes, this is to be     managed medically.  The patient will be kept on her home     medications including her current dose of Lasix as she does not     appear grossly volume overloaded.  If her symptoms do not  improve,     can consider a little bit more aggressive diuresis; although, her     weight does not appear to be a grossly change. 3. Recent urinary tract infection.  There is no culture data     available, as this was done as an outpatient.  However, the patient     was started on Macrobid.  I am not sure if this was done for an     organism that may be resistant to  other antibiotics.  However, we     are going to go ahead and treat her with Rocephin.  Per shortness     of breath, should cover regular urinary pathogens.  Furthermore,     her current urinalysis is negative and does not indicate a urinary     tract infection.  We will just go ahead and monitor at this point     with the Rocephin as mentioned. 4. Diastolic dysfunction.  We will go ahead and continue the patient's     beta blocker as well as Lasix at home dose.  We will monitor daily     strict Is and Os, daily weights, and consider diuresis if needed. 5. Code status.  This has been discussed in the past, and the patient     is currently DNR.  This was discussed with both the patient and     daughter.  The DNR form has been signed and put in the chart.  In     case of need for medical decision-making, her daughter [name], should be contacted and as mentioned above, her cell     phone number is [phone] and her home phone number is [phone].     Mariea Stable, MD     MA/MEDQ  D:  02/05/2011  T:  02/05/2011  Job:  [id number]  cc:   Almedia Balls. Ranell Patrick, M.D. Verne Carrow, MD  Electronically Signed by Mariea Stable MD on 02/22/2011 10:28:51 AM

## 2011-02-22 NOTE — Telephone Encounter (Signed)
Patient daughter called stating that mother was recently admitted w/ pneumonia. She c/o chest pain when coughing and would like a call back with MD advisment. Thanks

## 2011-02-22 NOTE — Telephone Encounter (Signed)
Chest pain started last night. Hurt in the front and back of the chest. No associated cough. Worse with deep inspiration. No diaphoresis, no SOB - more than usual. Not in pain at rest and is comfortable at this time.  Plan - add on tomorrow - 11:30. Pt aware.

## 2011-02-23 ENCOUNTER — Ambulatory Visit (INDEPENDENT_AMBULATORY_CARE_PROVIDER_SITE_OTHER)
Admission: RE | Admit: 2011-02-23 | Discharge: 2011-02-23 | Disposition: A | Discharge status: Home / Self Care | Payer: Medicare Other | Source: Ambulatory Visit | Attending: Internal Medicine | Admitting: Internal Medicine

## 2011-02-23 ENCOUNTER — Ambulatory Visit (INDEPENDENT_AMBULATORY_CARE_PROVIDER_SITE_OTHER): Payer: Medicare Other | Admitting: Internal Medicine

## 2011-02-23 DIAGNOSIS — R079 Chest pain, unspecified: Secondary | ICD-10-CM

## 2011-02-23 DIAGNOSIS — I5032 Chronic diastolic (congestive) heart failure: Secondary | ICD-10-CM

## 2011-02-23 DIAGNOSIS — I509 Heart failure, unspecified: Secondary | ICD-10-CM

## 2011-02-24 NOTE — Progress Notes (Signed)
  Subjective:    Patient ID: [name], female    DOB: Mar 11, 1918, 77 y.o.   MRN: [id number]  HPI  Patient recently hospitalized with CAP and CHF. She had been seen for post-ospital follow-up and was doing doing well. Her daughter called yesterday due to patients complaint of pain that started in the back, went to anterior chest and is now isolated tot he back. She has had no increased SOB, sputum production, fever, diaphoresis. She is here with her daughter to ensure that she remains stable.  I have reviewed the patient's medical history in detail and updated the computerized patient record.     Review of Systems  Constitutional: Negative for fever, chills and activity change.  HENT: Negative for ear pain, congestion and neck pain.   Eyes: Negative.   Respiratory: Negative for cough, choking, chest tightness and wheezing.   Cardiovascular: Positive for chest pain. Negative for palpitations and leg swelling.  Gastrointestinal: Negative.   All other systems reviewed and are negative.   Lab Results  Component Value Date   WBC 9.5 02/19/2011   HGB 11.4* 02/19/2011   HCT 33.6* 02/19/2011   PLT 244.0 02/19/2011   CHOL 115 02/17/2010   TRIG 155.0* 02/17/2010   HDL 61.10 02/17/2010   ALT 13 02/19/2011   AST 23 02/19/2011   NA 137 02/19/2011   K 4.8 02/19/2011   CL 95* 02/19/2011   CREATININE 1.3* 02/19/2011   BUN 27* 02/19/2011   CO2 34* 02/19/2011   TSH 3.95 02/17/2010   INR 1.05 08/18/2010        BNP                      161.6                                                                 02/19/2011  Chest x-ray - cephalazation of vessels but no frank edema, no plueral abnormality.       Objective:   Physical Exam Elderly but spry white woman in no distress. HEENT - wnl Chest - moderate kyphosis Lungs - CTAP       Assessment & Plan:  1. CAP - no recurrence  2. Chest pain - no evidence of MI with normal EKG, no evidence of failure, no pleural mass.  Plan - cautious  reassurance. For any change or worsening of pain - call and return.
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
## 2011-03-16 NOTE — Discharge Summary (Signed)
[name], [name] NO.:  [id number]   MEDICAL RECORD NO.:  [id number]          PATIENT TYPE:  INP   LOCATION:  2006                         FACILITY:  [facility]   PHYSICIAN:  Titus Dubin. Hopper, MD,FACP,FCCPDATE OF BIRTH:  May 03[birth year] 1920   DATE OF ADMISSION:  11/24/2008  DATE OF DISCHARGE:  11/29/2008                               DISCHARGE SUMMARY   PRIMARY CARE PHYSICIAN:  Rosalyn Gess. Norins, MD.   CARDIOLOGIST:  Verne Carrow, MD.   DIAGNOSES:  1. Escherichia coli sepsis secondary to urinary tract infection.  2. Acute-on-chronic diastolic heart failure in setting of severe      mitral valve regurgitation.   HISTORY OF PRESENT ILLNESS:  Ms. [name] is a very pleasant 77 year old  white female with past medical history of severe mitral regurgitation  and secondary severe pulmonary hypertension due to mitral valve  prolapse, who presented to [facility] Emergency Room  with shortness of  breath in the context of fever, chills &  low back pain.  Of note, the  patient did have a pessary placed per OB/GYN approximately 2 weeks prior  to this admission.   PAST MEDICAL HISTORY:  1. Degenerative joint disease and osteoarthritis.  2. Osteopenia.  3. Severe mitral valve regurgitation.  4. Hyperlipidemia.  5. GERD.  6. History of breast cancer, status post left-sided mastectomy.  7. Status post appendectomy.  8. History of urinary incontinence.  9. Cataract surgery.   CONSULTATIONS DURING THIS ADMISSION:  [facility].   COURSE OF HOSPITALIZATION:  1. E. coli sepsis in setting of urinary tract infection.  The patient      admitted from the ER with fever and leukocytosis and positive      urinalysis at the time of admission.  Urine culture did reveal      approximately 30,000 colonies of E. coli.  In addition, blood      cultures x2 obtained at the time of admission were both positive      for E. coli sensitive to Cipro.  The patient has remained  hemodynamically stable since the time of admission with significant      decrease in white cell count as well as remaining afebrile      throughout hospitalization.  The patient felt medically stable for      discharge home on continued Cipro therapy for a total of 14 days of      treatment.  2. Acute-on-chronic diastolic heart failure in setting of severe      mitral valve regurgitation.  Cardiology was asked to see the      patient in consultation during this admission.  A 2-D echo      performed on November 25, 2008, revealing left ventricular ejection      fraction of 55-60% with mild lateral hypokinesis and moderate-to-      severe mitral valve regurgitation.  At the time of discharge,      Cardiology has recommended increasing the patient's home dose Lasix      to 40 mg daily with close followup in the office as mentioned      below.  In terms of volume status, the patient is compensated at      the time of dictation with stable O2 saturations.   MEDICATIONS AT THE TIME OF DISCHARGE:  1. Lasix 40 mg p.o. daily, note this is a new dose.  2. Potassium chloride 20 mEq p.o. daily, note this is a new dose.  3. Cipro 250 mg p.o. b.i.d. until gone.  4. Lovastatin 40 mg p.o. daily.  5. Coreg 25 mg p.o. twice daily.  6. Aspirin 81 mg p.o. daily.  7. Ferrous sulfate 325 mg p.o. daily.  8. Os-Cal plus D 500 mg p.o. t.i.d.  9. Multivitamin p.o. daily.   PERTINENT LABORATORY WORK AT THE TIME OF DISCHARGE:  White cell count  12.3, platelet count 166, hemoglobin 10.9, and hematocrit 32.2.  Sodium  141, potassium 4.0, creatinine 1.04, BNP on November 27, 2008, 510,  improved vs value on November 25, 2008.   DISPOSITION:  The patient felt medically stable for discharge home at  this time.  The patient is instructed to follow up with her primary care  physician, Dr. Illene Regulus on Friday, December 06, 2008, at 1:20 p.m.  at which time she will need a recheck BMET to check potassium.  In   addition, the patient is scheduled to follow up with Dr. Earney Hamburg  at [facility] on December 16, 2008, at 12:15 p.m.      Cordelia Pen, NP      Titus Dubin. Alwyn Ren, MD,FACP,FCCP  Electronically Signed    LE/MEDQ  D:  11/29/2008  T:  11/30/2008  Job:  [id number]   cc:   Rosalyn Gess. Norins, MD  Verne Carrow, MD

## 2011-03-16 NOTE — Assessment & Plan Note (Signed)
[facility]                            CARDIOLOGY OFFICE NOTE   RAGINA, FENTER                    MRN:          [id number]  DATE:09/30/2008                            DOB:          25-Feb-1918    PRIMARY CARE PHYSICIAN:  Rosalyn Gess. Norins, MD   HISTORY OF PRESENT ILLNESS:  Ms. Jarnagin is a pleasant 77 year old  Caucasian female with a past medical history significant for  hyperlipidemia, gastroesophageal reflux disease, and severe mitral  regurgitation, who has previously been followed in this clinic by Dr.  Nona Dell.  Dr. Diona Browner is no longer seeing patients in this  clinic and because of this the patient is here today to establish  cardiology care with me.  The patient tells me that she has been doing  well.  She has apparently developed some arthritic pain in her knees and  has been slowed down somewhat because of this.  She tells me that her  breathing has been at baseline.  She notices shortness of breath with  exertion; however, this has been stable for the last several years.  She  denies any chest discomfort, palpitations, dizziness, near syncope,  syncope, orthopnea, PND, or lower extremity edema.  The patient was  diagnosed with mitral regurgitation several years ago while living in  [place].  She was seen by a surgeon at that time to discuss surgical  options.  The patient as well as her cardiologist in [place] and the  cardiothoracic surgeon in [place] elected for a conservative management.  She has been followed by Dr. Diona Browner in this office for the last 3  years and has been continued with conservative management.  Her most  recent echocardiogram was performed in April 2008 and showed severe  mitral regurgitation with preserved left ventricular function.   PAST MEDICAL HISTORY:  1. Severe mitral regurgitation.  2. Hyperlipidemia.  3. Gastroesophageal reflux disease.  4. Breast cancer with prior mastectomy and  chemotherapy.   PAST SURGICAL HISTORY:  1. Appendectomy.  2. Cataract surgery.  3. Mastectomy  4. Kneecap surgery.   ALLERGIES:  CONTRAST DYE.   CURRENT MEDICATIONS:  1. Coreg 25 mg twice daily.  2. Lovastatin 40 mg once daily.  3. Actonel 35 mg once weekly.  4. Enteric-coated aspirin 81 mg once daily.  5. Os-Cal plus vitamin D once daily.  6. Theragran-M once daily.  7. Lasix 20 mg every other day.  8. Potassium 10 mEq every other day.  9. Omega-3, 2 g once daily.   SOCIAL HISTORY:  The patient denies the use of tobacco, alcohol, or  illicit drugs.   REVIEW OF SYSTEMS:  As stated in the history present illness is  otherwise negative.   PHYSICAL EXAMINATION:  VITALS:  Blood pressure 140/60, pulse 64 and  regular, respirations 12 and unlabored.  GENERAL:  She is a pleasant elderly Caucasian female in no acute  distress.  She is alert and oriented x3.  PSYCHIATRIC:  Mood and affect are normal.  MUSCULOSKELETAL:  Muscle strength and tone is normal.  NEUROLOGICAL:  No focal neurological deficits.  SKIN:  Warm  and dry.  HEENT:  Normal.  NECK:  No JVD.  No carotid bruits.  No thyromegaly.  No lymphadenopathy.  LUNGS:  Clear to auscultation bilaterally without wheezes, rhonchi, or  crackles noted.  CARDIOVASCULAR:  Regular rate and rhythm with a 3/6 systolic murmur  noted at the apex.  There are no gallops or rubs noted.  ABDOMEN:  Soft, nontender.  Bowel sounds are present.  EXTREMITIES:  No evidence of lower extremity edema.  Pulses are 1+ in  all extremities.   ASSESSMENT AND PLAN:  Ms. Luallen is a pleasant 77 year old Caucasian  female with known severe mitral regurgitation with thickened mitral  valve and prolapse of the posterior leaflet with mitral annular  calcification and secondary to pulmonary hypertension.  She seems to be  doing well from a symptomatic standpoint while using her Lasix every  other day.  Her blood pressure is mildly elevated; however, she  tells me  that at home it is generally better controlled in the 110s and 120s.  The patient is still requesting a conservative approach to her mitral  regurgitation at this point.  I do not see any indication for any change  in her medications.  I would like to see her back in this office in 6  months.  We will repeat an echocardiogram at that time.     Verne Carrow, MD  Electronically Signed    CM/MedQ  DD: 09/30/2008  DT: 10/01/2008  Job #: [id number]   cc:   Rosalyn Gess. Norins, MD

## 2011-03-16 NOTE — Assessment & Plan Note (Signed)
[facility]ALTHCARE                            CARDIOLOGY OFFICE NOTE   ZULA, HOVSEPIAN                    MRN:          [id number]  DATE:07/07/2007                            DOB:          1918-03-19    FOLLOW-UP VISIT:   PRIMARY CARE PHYSICIAN:  Rosalyn Gess. Norins, MD   REASON FOR VISIT:  Cardiac follow-up.   HISTORY OF PRESENT ILLNESS:  [name] returns having last been  evaluated in March.  I did refer her for a follow-up echocardiogram,  which reveals a left ventricular ejection fraction of 55-60%.  She has a  moderately thickened mitral valve with mild prolapse, with the posterior  leaflet associated with severe mitral regurgitation.  There is also  moderate mitral annular calcification.  She had significantly elevated  pulmonary artery pressures likely related to this as well.  We have been  managing her conservatively following her renal diagnosis and treatment  back in [place].  We have talked about the possibility of surgery,  although she has preferred not to pursue this.  She reports stable  symptoms, still being able to ambulate on a treadmill on level ground  for 30 minutes.  Inclines precipitate more dyspnea on exertion.  Her  electrocardiogram shows a sinus rhythm, occasional premature ventricular  complexes which are new.  Her medications are outlined below.  She has  not been using Lasix at all and has had no lower extremity edema.   ALLERGIES:  No known drug allergies.   OTHER MEDICATIONS:  1. Coreg 25 mg p.o. b.i.d.  2. Lovastatin 40 mg p.o. daily.  3. Actonel 35 mg weekly.  4. Enteric-coated aspirin 81 mg p.o. daily.  5. Os-Cal With Vitamin D.  6. Theragran-M.  7. Ferrous sulfate.   REVIEW OF SYSTEMS:  As described in the history of present illness.  No  palpitations or syncope.   EXAMINATION:  Blood pressure 106/62, heart rate 71, weight is 133  pounds.  Comfortable, elderly woman in no acute distress,  normally-nourished.  HEENT:  Normal.  NECK:  Supple.  No elevated jugular venous pressure.  Without bruits.  No thyromegaly noted.  LUNGS:  Clear.  Some coarse breath sounds at the bases, no rales or  wheezing.  CARDIAC:  A regular rate and rhythm, a 2/6 holosystolic murmur heard  best at the apex.  No S3 gallop.  ABDOMEN:  Soft, nontender, normoactive bowel sounds.  EXTREMITIES:  No pitting edema.  Distal pulses are 1+.  SKIN:  Warm and dry.  MUSCULOSKELETAL:  Significant kyphosis is noted.  NEUROPSYCHIATRIC:  The patient is alert and oriented x3.   IMPRESSION AND RECOMMENDATIONS:  1. Severe mitral regurgitation with thickened mitral valve, prolapse      of the posterior leaflet and mitral annular calcification.  Also      noted is pulmonary hypertension.  We will continue a course of      conservative management at the patient's request.  I have asked her      to start taking her Lasix at every-other-day dosing to see if this      helps  decrease lung water and her sensation of dyspnea with      exertion.  She will give [place] a call back over the next 2 weeks to      let us know how she is feeling and if she has improved, she will      need a BMET and more than likely a potassium supplement long-term.      Otherwise, I will plan to see her back over the next 6 months.  2. Further plans to follow.     Jonelle Sidle, MD  Electronically Signed    SGM/MedQ  DD: 07/07/2007  DT: 07/07/2007  Job #: [id number]   cc:   Rosalyn Gess. Norins, MD

## 2011-03-16 NOTE — Assessment & Plan Note (Signed)
[facility]                                 ON-CALL NOTE   [name], [name]                    MRN:          [id number]  DATE:11/23/2008                            DOB:          11-Mar-1918    PRIMARY CARDIOLOGIST:  Verne Carrow, MD    [name] is an 77 year old female with severe MR.  Her daughter  called me because she stated that Ms. [name]'s blood pressure was low  and she was feeling very weak.  I discussed the situation with her.   This morning, [name] had taken her usual dose of Coreg 25 mg.  She  takes Lasix 20 mg every other day and the last dose of that was the day  before.  According to her daughter, her systolic blood pressure was  running in the 80s on the home blood pressure machine and her heart rate  was also in the 80s.  [name] did not want to come to the hospital.  I got the daughter to recheck her blood pressure and it was now 130  systolic.  I requested that [name] take half of her Coreg instead  of a whole Coreg twice daily and hold the Lasix.  I will leave a message  with the office for her to be given a phone call for a followup visit.  If her condition worsens or if she needs to be checked, she is to call  911 and come to the emergency room.      Theodore Demark, PA-C  Electronically Signed      Luis Abed, MD, [facility]  Electronically Signed   RB/MedQ  DD: 11/24/2008  DT: 11/25/2008  Job #: [id number]

## 2011-03-16 NOTE — Consult Note (Signed)
NAMEREBEKKA, LOBELLO NO.:  [id number]   MEDICAL RECORD NO.:  [id number]          PATIENT TYPE:  INP   LOCATION:  2608                         FACILITY:  [facility]   PHYSICIAN:  Rollene Rotunda, MD, FACCDATE OF BIRTH:  1918-07-23   DATE OF CONSULTATION:  11/25/2008  DATE OF DISCHARGE:                                 CONSULTATION   REASON FOR CONSULTATION:  CHF.   PRIMARY CARDIOLOGIST:  Verne Carrow, MD   PRIMARY CARE PHYSICIAN:  Rosalyn Gess. Norins, MD   REQUESTING PHYSICIAN:  Clair Gulling Team and Dr. Raenette Rover. Felicity Coyer, MD.   HISTORY OF PRESENT ILLNESS:  This is an 77 year old Caucasian female  with known history of severe mitral regurgitation and CHF, admitted  secondary to progressive dyspnea and a UTI.  On-call cardiology note for  the weekend of November 23, 2008, reports weakness and hypotension over  the weekend at which time the patient was advised to hold her Lasix and  decrease her Coreg from 25 mg b.i.d. to 12.5 mg b.i.d. and come to ER if  symptoms persisted.  The patient did come to the emergency room the  following day after a restless night of associated shortness of breath.  The patient was admitted and was found to be febrile in the emergency  room with a temperature of 102.4, also positive for UTI and started on  Rocephin and Zithromax.  The patient's chest x-ray revealed cardiomegaly  with pulmonary vascular congestion and we are asked to continue to  assist in management of this.   REVIEW OF SYSTEMS:  Positive for shortness of breath, dyspnea on  exertion, orthopnea, and also hypotension.  The patient denies chest  pain. urinary frequency, or dysuria.  She is positive for fevers and  chills.   PAST MEDICAL HISTORY:  1. Severe MR.      a.     Status post echocardiogram dated April 2008 revealing mitral       valve prolapse in the posterior leaflet  with mitral orifice MR       0.6 sq mm, isovelocity surface area of 31 mL, LA moderate     dilatation, to be treated conservatively at that time.  2. Severe pulmonary hypertension.  3. CHF.  4. Hyperlipidemia.  5. GERD.  6. History of breast cancer, status post left mastectomy.  7. Frequent urinary incontinence.   PAST SURGICAL HISTORY:  Mastectomy, appendectomy, cataract removal, and  knee cap surgery.   SOCIAL HISTORY:  She lives in [place] with her daughter.  She is  retired.  She is married.  Her husband is in [employer].  She lives with her daughter.  She does not smoke.  Does not drink  alcohol.  No drug use.   FAMILY HISTORY:  Mother with heart trouble and deceased.  Father  deceased from unknown cause.  She has 3 sisters and 2 brothers and their  health history is unknown by the patient.   CURRENT MEDICATIONS:  1. Coreg 12.5 mg b.i.d.  2. Zocor 20 mg q.p.m.  3. Protonix 40 mg daily.  4. Lovaza 2 g t.i.d.  5. Low-molecular weight heparin.  6. Aspirin 81 mg daily.  7. Rocephin 1 g q.24 h.  8. Zithromax 250 mg IV.  9. Tylenol 650 q.4 h. p.r.n.  10.Nitroglycerin which has been placed on hold.  11.Lasix 20 mg daily.   ALLERGIES:  CONTRAST DYE.   CURRENT LABORATORY DATA:  Sodium 136, potassium 3.9, chloride 104, CO2  of 23, BUN 22, creatinine 1.1, and glucose 135.  Hemoglobin 10,  hematocrit 29.9, white blood cells 27.3, and platelets 129.  Troponin  0.05, 0.01, and 0.03.  Chest x-ray revealing cardiomegaly with pulmonary  vascular congestion status post left mastectomy.  EKG revealing normal  sinus rhythm, ventricular rate of 94 beats per minute.   PHYSICAL EXAMINATION:  VITAL SIGNS:  Blood pressure 104/52, pulse 71,  respirations 20, temperature 98.8, O2 sat 98% on 2 liters, and weight  60.9.  GENERAL:  She is awake, alert, and oriented, in no acute distress.  HEENT:  Head is normocephalic and atraumatic.  Eyes, PERRLA.  Mucous  membranes and mouth are pink and moist.  Tongue is midline.  NECK:  Supple.  Positive carotid bruit.  Mild  JVD is noted.  CARDIOVASCULAR:  Regular rate and rhythm with extrasystole, 2/6 systolic  murmur and 1/6 diastolic murmur.  Pulses are 2+ and equal bilaterally.  LUNGS:  Bilateral crackles without wheezes or rhonchi.  ABDOMEN:  Soft and nontender, 2+ bowel sounds.  EXTREMITIES:  Without clubbing, cyanosis, or edema.  NEURO:  Cranial nerves II through XII are grossly intact.   IMPRESSION:  1. Acute on chronic diastolic congestive heart failure  2. Severe mitral regurgitation.  3. Urinary tract infection.  4. Hypotension.   PLAN:  An 77 year old Caucasian female with a known history of severe MR  and CHF, admitted with weakness, dyspnea, and positive UTI.  Echo is  being repeated today.  The patient states breathing is better.   PLAN:  I am impressed by her lung exam.  Diffuse crackles, not mentioned  by Dr. Clifton James on recent note.  Chest x-ray with diffuse ASD.  We will  continue with half dose of Coreg 12.5 mg b.i.d. and agree with  resumption of p.o. Lasix.  The patient is on low threshold for IV.  We  will follow making further recommendations throughout the hospital  course.      Bettey Mare. Lyman Bishop, NP      Rollene Rotunda, MD, [facility]  Electronically Signed    KML/MEDQ  D:  11/25/2008  T:  11/25/2008  Job:  [id number]   cc:   Rosalyn Gess. Norins, MD

## 2011-03-16 NOTE — Assessment & Plan Note (Signed)
[facility]                            CARDIOLOGY OFFICE NOTE   [name], [name]                    MRN:          [id number]  DATE:12/16/2008                            DOB:          1918/02/01    PRIMARY CARE PHYSICIAN:  Rosalyn Gess. Norins, MD   HISTORY OF PRESENT ILLNESS:  [name] is a pleasant 77 year old  Caucasian female with a past medical history significant for  hyperlipidemia, GERD, and severe mitral valvular regurgitation as well  as diastolic dysfunction, who was recently admitted to [facility] and treated for E. coli urosepsis as well as acute on chronic  heart failure.  She was discharged from the hospital on November 29, 2008, with an increased dose of Lasix.  She has since been seen by Dr.  Debby Bud, her primary care physician last week, and tells me that she has  done well following the hospitalization.  She has finished taking her  antibiotics for the urosepsis and has continued to take Lasix at a  higher dose of 40 mg per day.  She reports much improvement in her  baseline breathing, but tells me that she does become short of breath if  she exerts herself.  She denies any episodes of chest pain, dizziness,  near syncope, syncope, orthopnea, PND, or lower extremity edema.  Her  only other complaint is of occasional numbness over her bilateral shins.  This does not seem to be associated with ambulation, but occurs more at  night.   PAST MEDICAL HISTORY:  1. Severe mitral regurgitation with plans as outlined in prior      dictations for conservative management.  The patient was seen by a      surgeon in [place] several years ago and opted for conservative      medical management.  2. Hyperlipidemia.  3. Diastolic dysfunction.  4. GERD.  5. Breast cancer with prior mastectomy and chemotherapy.   CURRENT MEDICATIONS:  1. Ferrous sulfate 325 mg once daily.  2. Os-Cal 500 mg 3 times daily.  3. PreserVision 1  tablet twice daily.  4. Klor-Con 20 mEq once daily.  5. Lasix 40 mg once daily.  6. Omega-3 1 g 2 tablets once daily.  7. Theragran-M once daily.  8. Enteric-coated aspirin 81 mg once daily.  9. Actonel 35 mg weekly.  10.Lovastatin 40 mg once daily.  11.Coreg 25 mg twice daily.   REVIEW OF SYSTEMS:  As stated in the history present illness and is  otherwise negative.   PHYSICAL EXAMINATION:  VITALS:  Blood pressure is 122/62, pulse is 69  and regular, and respirations 12 and unlabored.  GENERAL:  She is a pleasant elderly Caucasian female in no acute  distress.  She is alert and oriented x3.  PSYCHIATRIC:  Mood and affect are appropriate.  SKIN:  Warm and dry.  NECK:  No JVD.  No thyromegaly.  No carotid bruits.  No lymphadenopathy.  LUNGS:  Clear to auscultation bilaterally without wheezes, rhonchi, or  crackles noted.  CARDIOVASCULAR:  Regular rate and rhythm with a 3/6 systolic murmur at  the apex.  ABDOMEN:  Soft and nontender.  Bowel sounds are present.  EXTREMITIES:  No evidence of edema.  There are small varicosities over  both lower extremities.  Pulses are 1+ in the dorsalis pedis and  posterior tibial arteries.  By handheld Doppler, the pulses are at least  biphasic in all locations in lower extremities.   DIAGNOSTIC STUDIES:  1. A 12-lead EKG obtained in our office that shows normal sinus rhythm      with a ventricular rate of 69 beats per minute.  There are no      abnormal ST changes or T-wave abnormalities.   ASSESSMENT AND PLAN:  This is an pleasant 77 year old Caucasian female  with a history of severe mitral regurgitation, hyperlipidemia,  gastroesophageal reflux disease, and diastolic dysfunction, who follows  up today for Cardiology visit after her recent discharge from the  hospital.  As stated above, the patient was admitted with urosepsis and  acute on chronic diastolic heart failure with volume overload.  She  responded well to diuresis and has been  adequately treated for her  urinary tract infection.  I agreed with her current dose of Lasix, which  is 40 mg once daily.  The patient is kept a strict log of her weights,  blood pressures, and heart rate at home.  There has been little  fluctuation of her weight of 122 pounds at home.  Her blood pressure has  been well controlled in the 110-120 range systolically.  I have  encouraged her to remain active.   I would like to see her back in our office in 6 months.  I have  encouraged her to continue to follow up with Dr. Debby Bud, her primary  care.     Verne Carrow, MD  Electronically Signed    CM/MedQ  DD: 12/16/2008  DT: 12/17/2008  Job #: [id number]   cc:   Rosalyn Gess. Norins, MD

## 2011-03-16 NOTE — Assessment & Plan Note (Signed)
[facility]                            CARDIOLOGY OFFICE NOTE   CLORIS, FLIPPO                    MRN:          [id number]  DATE:01/11/2008                            DOB:          [birth year]    PRIMARY CARE PHYSICIAN:  Rosalyn Gess. Norins, MD.   REASON FOR VISIT:  Followup on mitral regurgitation.   HISTORY OF PRESENT ILLNESS:  I saw Ms. Hemann back in September.  She  has had more breathlessness with activity, although none at rest.  She  has been using Lasix 20 mg every other day with potassium  supplementation and reports that her weight has been generally stable.  She had followup blood work demonstrating potassium of 4.4 and BUN and  creatinine of 16 and 1.0 respectively.  I reviewed her electrocardiogram  today which shows normal sinus rhythm.  Today we talked again about the  natural history of mitral regurgitation and the plan has been continued  conservative management.   ALLERGIES:  No known drug allergies.   MEDICATIONS:  1. Coreg 25 mg p.o. b.i.d.  2. Lovastatin 40 mg p.o. daily.  3. Actonel 35 mg weekly.  4. Enteric coated aspirin 81 mg p.o. daily.  5. Os-Cal with vitamin D.  6. Theragran M.  7. Lasix 20 mg p.o. q.i.d.  8. Potassium 10 mEq p.o. every other day.  9. Aleve p.r.n.   REVIEW OF SYSTEMS:  As described in history of present illness.  No  palpitations or syncope.   PHYSICAL EXAMINATION:  VITAL SIGNS:  Blood pressure is 135/68, heart  rate is in the 60s, weight 136 pounds, up 3 pounds from the last visit.  GENERAL:  The patient is comfortable at rest and in no acute distress.  NECK:  Examination of the neck shows no loud carotid bruits or elevated  jugular venous pressures.  LUNGS:  Generally clear although somewhat rhonchorous at the bases.  No  wheezing noted.  Significant kyphosis is noted.  CARDIAC:  Exam reveals a regular rate and rhythm.  Soft holosystolic  murmur at the apex.  No S3 gallop.  EXTREMITIES:  Exhibit trace edema.   IMPRESSION AND RECOMMENDATIONS:  Severe mitral regurgitation with  thickened mitral valve and prolapse of the posterior leaflet with mitral  annular calcification and secondary pulmonary hypertension.  I have  asked her to increase Lasix to every day along with potassium  supplementation.  Otherwise continue her present medications.  A  conservative approach is still requested at this point.  I would like to  see her back over the next 6 months.  She will otherwise continue to see  Dr. Debby Bud.     Jonelle Sidle, MD  Electronically Signed    SGM/MedQ  DD: 01/11/2008  DT: 01/12/2008  Job #: [id number]   cc:   Rosalyn Gess. Norins, MD

## 2011-03-19 NOTE — Assessment & Plan Note (Signed)
[facility]                              CARDIOLOGY OFFICE NOTE   Julia, Murillo                    MRN:          [id number]  DATE:08/30/2006                            DOB:          Jul 03, 1918    PRIMARY CARE PHYSICIAN:  [name].   REASON FOR VISIT:  Followup mitral valve disease.   HISTORY OF PRESENT ILLNESS:  I saw [name] back in April.  She  continues to do her regular 30-minute walk on the treadmill and is  tolerating this well.  She has typically NYHA class II symptoms unless she  tries to walk on an incline.  She is not having any angina chest pain,  palpitations, and has no syncope or orthopnea.  Her electrocardiogram today  is normal showing sinus rhythm at 73 beats per minute.  She has very  infrequent lower extremity edema which is brief and she has not had to use  any Lasix for this.  She brings in a blood pressure record, typically  showing systolics in the 120 to 130 range with occasional increases into the  130 to 140 range.  I have asked her to keep an eye on this.   ALLERGIES:  No known drug allergies.   PRESENT MEDICATIONS:  1. Enteric-coated aspirin 81 mg p.o. daily.  2. Coreg 25 mg p.o. b.i.d.  3. Lovastatin 40 mg p.o. daily.  4. Actonel 35 mg q. week.  5. Os-Cal with vitamin D.  6. Theragran-M daily.  7. Ferrous sulfate 325 mg p.o. daily.  8. Lasix 20 mg p.o. p.r.n.   REVIEW OF SYSTEMS:  As described in the history of present illness.   EXAMINATION:  Blood pressure is 120/64.  Heart rate is 73.  Weight is 134  pounds, up 3 pounds from her last visit.  The patient is comfortable and in no acute distress.  HEENT:  Conjunctivae normal.  Oropharynx is clear.  NECK:  Supple without elevated jugular venous pressure.  LUNGS:  Clear without labored breathing.  CARDIAC:  Regular rate and rhythm with a 2/6 late systolic murmur heard at  the apex.  EXTREMITIES:  No pitting edema today.   IMPRESSION  AND RECOMMENDATIONS:  1. Known severe mitral regurgitation with posterior mitral valve prolapse      and overall normal left ventricular function.  The patient is being      managed conservatively at this time based on prior evaluation and      discussion and, at this point, is symptomatically stable on her present      regimen.  I will plan to see her back over the next 6 months for      symptom review.  2. Otherwise continue followup with [name].     Jonelle Sidle, MD    SGM/MedQ  DD: 08/30/2006  DT: 08/30/2006  Job #: [id number]   cc:   Julia Gess. Norins, MD

## 2011-03-19 NOTE — Assessment & Plan Note (Signed)
[facility]                            CARDIOLOGY OFFICE NOTE   [name], [name]                    MRN:          [id number]  DATE:01/27/2007                            DOB:          05-30-1918    PRIMARY CARE PHYSICIAN:  Rosalyn Gess. Norins, M.D.   REASON FOR VISIT:  Follow up of mitral valve disease.   HISTORY OF PRESENT ILLNESS:  [name] returns for a routine visit.  She continues to ambulate on her treadmill on level ground 30 minutes at  a time and tolerates this well.  When she does walk up an incline,  however she experiences more significant dyspnea NYHA class 2-3.  She  has not been experiencing any angina.  She does recall a single episode  of sudden nausea and emesis, but no progressive symptoms.  Her  electrocardiogram today is normal showing sinus rhythm at 67 beats per  minute.  Blood pressure reports from home look good overall and she is  tolerating her medicines well.  She has not had to use any p.r.n. Lasix  and denies any orthopnea or significant peripheral edema.  Today we  talked about her mitral valve disease process and likelihood that she  will experience continued progressive symptomatology.  She has not had a  recent follow up echocardiogram, predominantly given the fact that  invasive intervention was not anticipated.  We did discuss all of these  issues again today and felt like it would be a good idea to follow up on  her echocardiogram to at least reassess the severity of her mitral  regurgitation and perhaps help guide further therapies.   ALLERGIES:  No known drug allergies.   PRESENT MEDICATIONS:  1. Coreg 25 mg p.o. b.i.d.  2. Lovastatin 40 mg p.o. daily.  3. Actonel 35 mg weekly.  4. Enteric-coated aspirin 81 mg p.o. daily.  5. Os-Cal.  6. Vitamin D.  7. Theragran-M daily.  8. Ferrous sulfate 325 mg p.o. daily.  9. Lasix 20 mg p.o. p.r.n.   REVIEW OF SYSTEMS:  As per in the history of present  illness.   EXAMINATION:  Blood pressure is 110/60, heart rate is 67, weight is 136  pounds, which is stable.  The patient is comfortable and in no acute distress.  HEENT:  Conjunctivae is normal, pharynx clear.  NECK:  Supple without jugular venous pressure without bruits, no  thyromegaly is noted.  LUNGS:  Clear without labored breathing.  CARDIAC:  Reveals a regular rate and rhythm with a soft apical murmur.  ABDOMEN:  Soft, nontender, bowel sounds present.  EXTREMITIES:  Exhibited no significant pitting edema.  MUSCULOSKELETAL:  Kyphosis is noted.  NEURO/PSYCHIATRIC:  The patient is alert x3, affect is normal.   IMPRESSION AND RECOMMENDATIONS:  1. History of previously documented severe mitral regurgitation with      posterior leaflet prolapse and preserved ejection fraction.  We      will plan to continue her present regimen and obtain a follow up      echocardiogram for reassessment of her valvular status.  Ms.  Murillo has not been eager to pursue any invasive therapy for this      problem as has been outlined many times in the past.  We will      continue to follow with her.  2. Anticipate follow up over the next 6 months.     Jonelle Sidle, MD  Electronically Signed    SGM/MedQ  DD: 01/27/2007  DT: 01/27/2007  Job #: [id number]   cc:   Rosalyn Gess. Norins, MD

## 2011-04-09 ENCOUNTER — Encounter: Payer: Self-pay | Admitting: Cardiovascular Disease

## 2011-04-12 ENCOUNTER — Ambulatory Visit (INDEPENDENT_AMBULATORY_CARE_PROVIDER_SITE_OTHER): Payer: Medicare Other | Admitting: Cardiovascular Disease

## 2011-04-12 ENCOUNTER — Encounter: Payer: Self-pay | Admitting: Cardiovascular Disease

## 2011-04-12 VITALS — BP 124/62 | HR 66 | Ht <= 58 in | Wt 133.8 lb

## 2011-04-12 DIAGNOSIS — I080 Rheumatic disorders of both mitral and aortic valves: Secondary | ICD-10-CM

## 2011-04-12 DIAGNOSIS — I2510 Atherosclerotic heart disease of native coronary artery without angina pectoris: Secondary | ICD-10-CM

## 2011-04-12 NOTE — Progress Notes (Signed)
History of Present Illness:[name] is a pleasant 77 year old Caucasian female with a past medical history significant for hyperlipidemia, GERD, and severe mitral valvular regurgitation as well as diastolic dysfunction here for cardiac follow up. She was admitted  to [facility] and treated for E. coli urosepsis as well as acute on chronic heart failure in January 2010 and was discharged on Lasix therapy.  I last saw her in the office in February 2012. She was admitted to [facility] 4/12 with pneumonia and CHF. She has done well since discharge.She still reports some SOB. No chest pain, palpitations, near syncope or syncope.   Past Medical History  Diagnosis Date  . Coronary artery disease   . Peripheral neuropathy   . Aortic insufficiency     mild  . Cerumen impaction   . Unspecified diastolic heart failure   . Hyperlipemia   . Mitral valve prolapse   . Mitral regurgitation     severe  . Urosepsis   . Anemia, unspecified   . Osteoporosis   . Adenocarcinoma, breast   . Irritable bladder   . Urinary incontinence   . Hypothyroidism   . GERD (gastroesophageal reflux disease)   . Osteoarthritis   . Hx of appendectomy   . H/O: whooping cough   . History of intraocular lens implant     bilateral  . Hx  of bilateral cataract extraction   . History of left mastectomy   . History of breast biopsy   . Closed fracture of patella     repair of right knee  . Hx of tonsillectomy     Past Surgical History  Procedure Date  . Appendectomy   . Cataract extraction w/ intraocular lens  implant, bilateral   . Mastectomy     left breast  . Breast biopsy     Current Outpatient Prescriptions  Medication Sig Dispense Refill  . aspirin 81 MG tablet Take 81 mg by mouth daily.        . calcium-vitamin D (OSCAL WITH D) 500-200 MG-UNIT per tablet Take 1 tablet by mouth 3 (three) times daily.        . carvedilol (COREG) 25 MG tablet Take 1 tablet (25 mg total) by mouth 2 (two)  times daily with a meal.  60 tablet  11  . ferrous sulfate 325 (65 FE) MG EC tablet Take 325 mg by mouth as needed.       . fish oil-omega-3 fatty acids 1000 MG capsule Take 2 g by mouth daily.        . furosemide (LASIX) 40 MG tablet Take 40 mg by mouth 2 (two) times daily.        [name] guaiFENesin (MUCINEX) 600 MG 12 hr tablet Take 1,200 mg by mouth as needed.        . MULTIPLE VITAMINS PO Take by mouth.        . Multiple Vitamins-Minerals (PRESERVISION/LUTEIN) CAPS Take by mouth daily.        . naproxen sodium (ANAPROX) 220 MG tablet Take 220 mg by mouth as needed.        . potassium chloride 40 MEQ/15ML (20%) LIQD 1 TBS daily  600 mL  5  . DISCONTD: lisinopril (PRINIVIL,ZESTRIL) 5 MG tablet Take 5 mg by mouth daily.          No Known Allergies  History   Social History  . Marital Status: Married    Spouse Name: N/A    Number of Children:  N/A  . Years of Education: N/A   Occupational History  . Not on file.   Social History Main Topics  . Smoking status: Never Smoker   . Smokeless tobacco: Never Used  . Alcohol Use: No  . Drug Use: Not on file  . Sexually Active: Not on file   Other Topics Concern  . Not on file   Social History Narrative   HSGMarried- '39-'102 daughters- 1 died at birth, '45; 1 brother April 11, 2043, 2 grandchildren and 2 great grandsLives with daughterEnd of life care:- has a living will- DNR/DNI, no heroic measuresTobacco Use-noAlcohol Use- noDrug use-no    Family History  Problem Relation Age of Onset  . Heart disease Mother   . Cancer Mother     breast cancer  . Stroke Father   . Heart disease Father   . Heart disease Sister   . Alzheimer's disease Sister   . Heart disease Sister   . Heart disease Brother     Review of Systems:  As stated in the HPI and otherwise negative.   BP 124/62  Pulse 66  Ht 4\' 10"  (1.473 m)  Wt 133 lb 12.8 oz (60.691 kg)  BMI 27.96 kg/m2  Physical Examination: General: Well developed, well nourished, NAD HEENT: OP  clear, mucus membranes moist SKIN: warm, dry. No rashes. Neuro: No focal deficits Musculoskeletal: Muscle strength 5/5 all ext Psychiatric: Mood and affect normal Neck: No JVD, no carotid bruits, no thyromegaly, no lymphadenopathy. Lungs:Clear bilaterally, no wheezes, rhonci, crackles Cardiovascular: Regular rate and rhythm. Loud systolic murmur. No gallops or rubs. Abdomen:Soft. Bowel sounds present. Non-tender.  Extremities: No lower extremity edema. Pulses are 2 + in the bilateral DP/PT.  EKG:NSR, rate 66 bpm. Normal EKG

## 2011-04-12 NOTE — Assessment & Plan Note (Signed)
Stable. BP well controlled. No changes.

## 2011-04-12 NOTE — Patient Instructions (Signed)
Your physician recommends that you schedule a follow-up appointment in: 6 months with Dr. Clifton James. The office will mail you a reminder letter 2 months prior appointment date.

## 2011-04-13 ENCOUNTER — Other Ambulatory Visit: Payer: Self-pay | Admitting: *Deleted

## 2011-04-13 MED ORDER — FUROSEMIDE 40 MG PO TABS: 40.0000 mg | ORAL_TABLET | Freq: Two times a day (BID) | ORAL | Status: DC

## 2011-04-19 ENCOUNTER — Ambulatory Visit (INDEPENDENT_AMBULATORY_CARE_PROVIDER_SITE_OTHER): Payer: Medicare Other | Admitting: Internal Medicine

## 2011-04-19 ENCOUNTER — Encounter: Payer: Self-pay | Admitting: Internal Medicine

## 2011-04-19 DIAGNOSIS — N3289 Other specified disorders of bladder: Secondary | ICD-10-CM

## 2011-04-19 MED ORDER — CEFUROXIME AXETIL 250 MG PO TABS: 250.0000 mg | ORAL_TABLET | Freq: Two times a day (BID) | ORAL | Status: AC

## 2011-04-20 NOTE — Progress Notes (Signed)
Subjective:    Patient ID: [name], female    DOB: 1918-05-08, 77 y.o.   MRN: [id number]  HPI Mrs. Cordner, a 77 y/o woman, presents with a recurrent complaints of urinary frequency and urgency. She does not complain of dysuria but admits to some low back pain, below the level of the kidney. Her daughter reports that she will have dark urine raising the question in her mind of hematuria. Mrs. Syfert has been treated at least 6 times in the last 3 months for UTI with Cipro on several occasions, on Septra once and on macrodantin twice. She reports that her symptoms do clear with each treatment. At the last episode of symptoms June 6  Dr. Seymour Bars did a urine culture which by report of daughter came back as negative. Patient has had no fever, chills, N/V.  Past Medical History  Diagnosis Date  . Coronary artery disease   . Peripheral neuropathy   . Aortic insufficiency     mild  . Cerumen impaction   . Unspecified diastolic heart failure   . Hyperlipemia   . Mitral valve prolapse   . Mitral regurgitation     severe  . Urosepsis   . Anemia, unspecified   . Osteoporosis   . Adenocarcinoma, breast   . Irritable bladder   . Urinary incontinence   . Hypothyroidism   . GERD (gastroesophageal reflux disease)   . Osteoarthritis   . Hx of appendectomy   . H/O: whooping cough   . History of intraocular lens implant     bilateral  . Hx  of bilateral cataract extraction   . History of left mastectomy   . History of breast biopsy   . Closed fracture of patella     repair of right knee  . Hx of tonsillectomy    Past Surgical History  Procedure Date  . Appendectomy   . Cataract extraction w/ intraocular lens  implant, bilateral   . Mastectomy     left breast  . Breast biopsy    Family History  Problem Relation Age of Onset  . Heart disease Mother   . Cancer Mother     breast cancer  . Stroke Father   . Heart disease Father   . Heart disease Sister   . Alzheimer's  disease Sister   . Heart disease Sister   . Heart disease Brother    History   Social History  . Marital Status: Married    Spouse Name: N/A    Number of Children: N/A  . Years of Education: N/A   Occupational History  . Not on file.   Social History Main Topics  . Smoking status: Never Smoker   . Smokeless tobacco: Never Used  . Alcohol Use: No  . Drug Use: Not on file  . Sexually Active: Not on file   Other Topics Concern  . Not on file   Social History Narrative   HSGMarried- '39-'102 daughters- 1 died at birth, '25; 1 brother 21-Mar-2043, 2 grandchildren and 2 great grandsLives with daughterEnd of life care:- has a living will- DNR/DNI, no heroic measuresTobacco Use-noAlcohol Use- noDrug use-no       Review of Systems  Constitutional: Negative for fever, chills, activity change and appetite change.  HENT: Negative.   Eyes: Negative.   Respiratory: Negative for cough, chest tightness, shortness of breath and wheezing.   Cardiovascular: Negative.   Gastrointestinal: Negative.   Genitourinary: Positive for urgency, frequency, hematuria and flank pain. Negative  for dysuria and difficulty urinating.  Musculoskeletal: Negative.   Neurological: Negative.   Hematological: Negative.        Objective:   Physical Exam Vitals - noted Gen'l - WNWD white woman in no distress Back - no tenderness to percussion over the flanks Abd - no suprapubic tenderness, no lower quadrant tenderness.        Assessment & Plan:

## 2011-04-20 NOTE — Assessment & Plan Note (Addendum)
[name] has frequent problems with urgency and frequency. She has been treated for UTI repeatedly. She may have other contributing factors to her symptoms: incomplete bladder emptying due to cystocele even with pessary in place; weakness of the pelvic floor leading to stress incontinence; irritable bladder.  Plan - empiric treatment for possible UTI using ceftin 250 bid to cover for potentially resistant organisms           Note: checked on use of macrodantin: contra-indicated if CrCl is less than 60; ineffective if CrCl less than 40. Pt with est GFR 41            If symptoms do not resolve or if there is a rapid recurrence will try Vesicare 10mg  once a day for possible irritable bladder ( 7 day sample provided)  (greater than 50% of  25 minute visit spent on education and counseling)            If treatment for irritable bladder fails will refer for cystometrics.

## 2011-05-06 ENCOUNTER — Telehealth: Payer: Self-pay | Admitting: *Deleted

## 2011-05-06 MED ORDER — SOLIFENACIN SUCCINATE 5 MG PO TABS: 5.0000 mg | ORAL_TABLET | Freq: Every day | ORAL | Status: DC

## 2011-05-06 NOTE — Telephone Encounter (Signed)
UTI symptoms improved after completing Septra. She did continue to c/o leaking & urgency. She started samples of vesicare on Monday and this has helped symptoms. Patient requesting RX to do to [facility]/[place].

## 2011-05-06 NOTE — Telephone Encounter (Signed)
K. vesicare 5mg  daily, #30, refill prn (no order entered)

## 2011-05-06 NOTE — Telephone Encounter (Signed)
Patient informed. 

## 2011-10-14 ENCOUNTER — Telehealth: Payer: Self-pay | Admitting: *Deleted

## 2011-10-14 NOTE — Telephone Encounter (Signed)
Reviewed urine culture report - pan resistent to oral antibiotics except nitrofurantoin or refampin. There is a contra-indication for use with CrCl <60, and ineffective for CrCl <40. Last GFR 41.2  Will use nitrofurantoin 100mg  ER q 12 hrs x 7 days, #14 Will need follow-up U/A 3 days after completing treatment.

## 2011-10-14 NOTE — Telephone Encounter (Signed)
Dr Seymour Bars  just reviewed lab on urine culture that was done on 10/04/2011, urine culture is showing a type of staphylococcics that is resistant to all oral antibiotics other than Macrobid. Pt's daughter wanted Dr.  Seymour Bars to send results to you to get your opinion on how this should be treated. French Ana RN is faxing over urine culture for you to review .I will call tracy with your advisement [phone] ext [phone]

## 2011-10-15 NOTE — Telephone Encounter (Signed)
Nurse notified  

## 2011-10-20 ENCOUNTER — Encounter: Payer: Self-pay | Admitting: Cardiovascular Disease

## 2011-10-20 ENCOUNTER — Ambulatory Visit (INDEPENDENT_AMBULATORY_CARE_PROVIDER_SITE_OTHER): Payer: Medicare Other | Admitting: Cardiovascular Disease

## 2011-10-20 VITALS — BP 132/64 | HR 74 | Ht <= 58 in | Wt 130.0 lb

## 2011-10-20 DIAGNOSIS — I080 Rheumatic disorders of both mitral and aortic valves: Secondary | ICD-10-CM

## 2011-10-20 NOTE — Assessment & Plan Note (Signed)
She is known to have severe MR with calcified leaflets. I have not considered surgical referral before given her age and the fact that she has been relatively asymptomatic. Given her recent increase in dyspnea, will repeat echo. Will see her back in 3 weeks and discuss referral to CT surgery for consideration for valve replacement. She knows this would be high risk given her age.

## 2011-10-20 NOTE — Patient Instructions (Signed)
Your physician recommends that you schedule a follow-up appointment in: 3 weeks  Your physician has requested that you have an echocardiogram. Echocardiography is a painless test that uses sound waves to create images of your heart. It provides your doctor with information about the size and shape of your heart and how well your heart's chambers and valves are working. This procedure takes approximately one hour. There are no restrictions for this procedure.   

## 2011-10-20 NOTE — Progress Notes (Signed)
History of Present Illness: [name] is a pleasant 77 year old Caucasian female with a past medical history significant for hyperlipidemia, GERD, and severe mitral valvular regurgitation as well as diastolic dysfunction here for cardiac follow up. She was admitted to [facility] and treated for E. coli urosepsis as well as acute on chronic heart failure in January 2010 and was discharged on Lasix therapy. I last saw her in the office in June 2012. She has gotten progressively more dyspneic over the last six months. Her last echo was in January 2012 and showed severe MR with calcifiied valve leaflets.   She is here today for follow up.  No chest pain, palpitations, near syncope or syncope.  She is profoundly more dyspneic since her last visit. She can only walk fifty feet before she has to stop and catch her breath.    Past Medical History  Diagnosis Date  . Coronary artery disease   . Peripheral neuropathy   . Aortic insufficiency     mild  . Cerumen impaction   . Unspecified diastolic heart failure   . Hyperlipemia   . Mitral valve prolapse   . Mitral regurgitation     severe  . Urosepsis   . Anemia, unspecified   . Osteoporosis   . Adenocarcinoma, breast   . Irritable bladder   . Urinary incontinence   . Hypothyroidism   . GERD (gastroesophageal reflux disease)   . Osteoarthritis   . Hx of appendectomy   . H/O: whooping cough   . History of intraocular lens implant     bilateral  . Hx  of bilateral cataract extraction   . History of left mastectomy   . History of breast biopsy   . Closed fracture of patella     repair of right knee  . Hx of tonsillectomy     Past Surgical History  Procedure Date  . Appendectomy   . Cataract extraction w/ intraocular lens  implant, bilateral   . Mastectomy     left breast  . Breast biopsy     Current Outpatient Prescriptions  Medication Sig Dispense Refill  . aspirin 81 MG tablet Take 81 mg by mouth daily.        .  calcium-vitamin D (OSCAL WITH D) 500-200 MG-UNIT per tablet Take 1 tablet by mouth 3 (three) times daily.        . carvedilol (COREG) 25 MG tablet Take 1 tablet (25 mg total) by mouth 2 (two) times daily with a meal.  60 tablet  11  . ferrous sulfate 325 (65 FE) MG EC tablet Take 325 mg by mouth as needed.       . fish oil-omega-3 fatty acids 1000 MG capsule Take 2 g by mouth daily.        . furosemide (LASIX) 40 MG tablet Take 1 tablet (40 mg total) by mouth 2 (two) times daily.  60 tablet  6  . guaiFENesin (MUCINEX) 600 MG 12 hr tablet Take 1,200 mg by mouth as needed.        . MULTIPLE VITAMINS PO Take by mouth.        . Multiple Vitamins-Minerals (PRESERVISION/LUTEIN) CAPS Take by mouth daily.        . nitrofurantoin (MACRODANTIN) 100 MG capsule Take 100 mg by mouth 2 (two) times daily.        . potassium chloride 40 MEQ/15ML (20%) LIQD 1 TBS daily  600 mL  5  . solifenacin (VESICARE) 5 MG  tablet Take 1 tablet (5 mg total) by mouth daily.  90 tablet  3  . naproxen sodium (ANAPROX) 220 MG tablet Take 220 mg by mouth as needed.          No Known Allergies  History   Social History  . Marital Status: Married    Spouse Name: N/A    Number of Children: N/A  . Years of Education: N/A   Occupational History  . Not on file.   Social History Main Topics  . Smoking status: Never Smoker   . Smokeless tobacco: Never Used  . Alcohol Use: No  . Drug Use: Not on file  . Sexually Active: Not on file   Other Topics Concern  . Not on file   Social History Narrative   HSGMarried- '39-'102 daughters- 1 died at birth, '48; 1 brother Apr 10, 2043, 2 grandchildren and 2 great grandsLives with daughterEnd of life care:- has a living will- DNR/DNI, no heroic measuresTobacco Use-noAlcohol Use- noDrug use-no    Family History  Problem Relation Age of Onset  . Heart disease Mother   . Cancer Mother     breast cancer  . Stroke Father   . Heart disease Father   . Heart disease Sister   . Alzheimer's  disease Sister   . Heart disease Sister   . Heart disease Brother     Review of Systems:  As stated in the HPI and otherwise negative.   BP 132/64  Pulse 74  Ht 4\' 10"  (1.473 m)  Wt 130 lb (58.968 kg)  BMI 27.17 kg/m2  Physical Examination: General: Well developed, well nourished, NAD HEENT: OP clear, mucus membranes moist SKIN: warm, dry. No rashes. Neuro: No focal deficits Musculoskeletal: Muscle strength 5/5 all ext Psychiatric: Mood and affect normal Neck: No JVD, no carotid bruits, no thyromegaly, no lymphadenopathy. Lungs:Clear bilaterally, no wheezes, rhonci, crackles Cardiovascular: Regular rate and rhythm. Systolic murmur. No gallops or rubs. Abdomen:Soft. Bowel sounds present. Non-tender.  Extremities: No lower extremity edema. Pulses are 2 + in the bilateral DP/PT.  Echo January 2012:  Left ventricle: The cavity size was normal. Wall thickness was       normal. Systolic function was normal. The estimated ejection       fraction was in the range of 55% to 60%. Wall motion was normal;       there were no regional wall motion abnormalities. Features are       consistent with a pseudonormal left ventricular filling pattern,       with concomitant abnormal relaxation and increased filling       pressure (grade 2 diastolic dysfunction). E/medial e' > 15       suggests LV end diastolic pressure at least 20 mmHg.     - Aortic valve: There was no stenosis. Mild regurgitation.     - Mitral valve: Mildly calcified annulus. Moderately calcified       leaflets . Severe regurgitation. There does appear to be some       degree of prolapse of the posterior leaflet.     - Left atrium: The atrium was moderately to severely dilated.     - Right ventricle: The cavity size was normal. Systolic function was       normal.     - Atrial septum: Suspect PFO is present.     - Tricuspid valve: Peak RV-RA gradient: 40mm Hg (S).     - Pulmonary arteries: PA peak pressure: 50mm Hg (S).     -  Systemic veins: IVC was not well-visualized. Will estimate RA       pressure at 10 mmHg.     Impressions:            - Normal LV size and systolic function, EF 55-60%. Moderate       diastolic dysfunction with evidence for elevated LV filling       pressure.Severe mitral regurgitation with calcified mitral valve       and some degree of posterior leaflet prolapse. Normal RV size and       systolic function. Moderate pulmonary hypertension.

## 2011-10-22 ENCOUNTER — Ambulatory Visit (HOSPITAL_COMMUNITY): Payer: Medicare Other | Attending: Cardiovascular Disease | Admitting: Radiology

## 2011-10-22 DIAGNOSIS — I080 Rheumatic disorders of both mitral and aortic valves: Secondary | ICD-10-CM

## 2011-10-22 DIAGNOSIS — I2789 Other specified pulmonary heart diseases: Secondary | ICD-10-CM | POA: Insufficient documentation

## 2011-10-22 DIAGNOSIS — I379 Nonrheumatic pulmonary valve disorder, unspecified: Secondary | ICD-10-CM | POA: Insufficient documentation

## 2011-10-22 DIAGNOSIS — I2510 Atherosclerotic heart disease of native coronary artery without angina pectoris: Secondary | ICD-10-CM | POA: Insufficient documentation

## 2011-10-22 DIAGNOSIS — R0609 Other forms of dyspnea: Secondary | ICD-10-CM | POA: Insufficient documentation

## 2011-10-22 DIAGNOSIS — R0989 Other specified symptoms and signs involving the circulatory and respiratory systems: Secondary | ICD-10-CM | POA: Insufficient documentation

## 2011-10-22 DIAGNOSIS — I059 Rheumatic mitral valve disease, unspecified: Secondary | ICD-10-CM

## 2011-10-22 DIAGNOSIS — I079 Rheumatic tricuspid valve disease, unspecified: Secondary | ICD-10-CM | POA: Insufficient documentation

## 2011-10-22 DIAGNOSIS — I359 Nonrheumatic aortic valve disorder, unspecified: Secondary | ICD-10-CM | POA: Insufficient documentation

## 2011-11-15 ENCOUNTER — Other Ambulatory Visit: Payer: Self-pay

## 2011-11-15 MED ORDER — POTASSIUM CHLORIDE 40 MEQ/15ML (20%) PO LIQD: ORAL | Status: DC

## 2011-11-18 ENCOUNTER — Encounter: Payer: Self-pay | Admitting: Cardiovascular Disease

## 2011-11-18 ENCOUNTER — Ambulatory Visit (INDEPENDENT_AMBULATORY_CARE_PROVIDER_SITE_OTHER): Payer: Medicare Other | Admitting: Cardiovascular Disease

## 2011-11-18 VITALS — BP 102/59 | HR 68 | Ht 59.0 in | Wt 129.0 lb

## 2011-11-18 DIAGNOSIS — I340 Nonrheumatic mitral (valve) insufficiency: Secondary | ICD-10-CM

## 2011-11-18 DIAGNOSIS — I059 Rheumatic mitral valve disease, unspecified: Secondary | ICD-10-CM

## 2011-11-18 DIAGNOSIS — I080 Rheumatic disorders of both mitral and aortic valves: Secondary | ICD-10-CM

## 2011-11-18 NOTE — Patient Instructions (Signed)
Your physician wants you to follow-up in: 6 months  You will receive a reminder letter in the mail two months in advance. If you don't receive a letter, please call our office to schedule the follow-up appointment.  Your physician recommends that you continue on your current medications as directed. Please refer to the Current Medication list given to you today.  

## 2011-11-18 NOTE — Assessment & Plan Note (Signed)
Severe MR. She is not an operative candidate. I tried afterload reduction last year with Lisinopril but she did not tolerate because of hypotension and fatigue. Will continue current therapy. She will follow daily weights. Continue lasix and if weight increases, let us know and we will help titrate her Lasix.

## 2011-11-18 NOTE — Progress Notes (Signed)
History of Present Illness: [name] is a pleasant 78 year old Caucasian female with a past medical history significant for hyperlipidemia, GERD, and severe mitral valvular regurgitation as well as diastolic dysfunction here for cardiac follow up. She was admitted to [facility] and treated for E. coli urosepsis as well as acute on chronic heart failure in January 2010 and was discharged on Lasix therapy. I last saw her in the office in December  2012. She has gotten progressively more dyspneic over the last six months. Her last echo was in January 2012 and showed severe MR with calcifiied valve leaflets. At the last visit, she c/o more dyspnea. She can only walk fifty feet before she has to stop and catch her breath. I ordered an echo which showed severe MR with normal LV size and function. I have discussed this on the phone with her daughter. We had briefly discussed possible MV surgery however after a long discussion with her daughter and her primary care physician, Dr. Debby Bud, it was decided that this would not be the best option.   She tells me that she feels well today. She is still dyspneic but no changes. No chest pain, near syncope or syncope. She is a pleasant lady and is thankful for her health at her age.     Past Medical History  Diagnosis Date  . Coronary artery disease   . Peripheral neuropathy   . Aortic insufficiency     mild  . Cerumen impaction   . Unspecified diastolic heart failure   . Hyperlipemia   . Mitral valve prolapse   . Mitral regurgitation     severe  . Urosepsis   . Anemia, unspecified   . Osteoporosis   . Adenocarcinoma, breast   . Irritable bladder   . Urinary incontinence   . Hypothyroidism   . GERD (gastroesophageal reflux disease)   . Osteoarthritis   . Hx of appendectomy   . H/O: whooping cough   . History of intraocular lens implant     bilateral  . Hx  of bilateral cataract extraction   . History of left mastectomy   . History of  breast biopsy   . Closed fracture of patella     repair of right knee  . Hx of tonsillectomy     Past Surgical History  Procedure Date  . Appendectomy   . Cataract extraction w/ intraocular lens  implant, bilateral   . Mastectomy     left breast  . Breast biopsy     Current Outpatient Prescriptions  Medication Sig Dispense Refill  . aspirin 81 MG tablet Take 81 mg by mouth daily.        . calcium-vitamin D (OSCAL WITH D) 500-200 MG-UNIT per tablet Take 1 tablet by mouth 3 (three) times daily.        . carvedilol (COREG) 25 MG tablet Take 1 tablet (25 mg total) by mouth 2 (two) times daily with a meal.  60 tablet  11  . ferrous sulfate 325 (65 FE) MG EC tablet Take 325 mg by mouth as needed.       . fish oil-omega-3 fatty acids 1000 MG capsule Take 2 g by mouth daily.        . furosemide (LASIX) 40 MG tablet Take 1 tablet (40 mg total) by mouth 2 (two) times daily.  60 tablet  6  . guaiFENesin (MUCINEX) 600 MG 12 hr tablet Take 1,200 mg by mouth as needed.        [name]  MULTIPLE VITAMINS PO Take by mouth.        . Multiple Vitamins-Minerals (PRESERVISION/LUTEIN) CAPS Take by mouth daily.        . naproxen sodium (ANAPROX) 220 MG tablet Take 220 mg by mouth as needed.        . potassium chloride 40 MEQ/15ML (20%) LIQD 1 TBS daily  600 mL  2  . solifenacin (VESICARE) 5 MG tablet Take 1 tablet (5 mg total) by mouth daily.  90 tablet  3    No Known Allergies  History   Social History  . Marital Status: Married    Spouse Name: N/A    Number of Children: N/A  . Years of Education: N/A   Occupational History  . Not on file.   Social History Main Topics  . Smoking status: Never Smoker   . Smokeless tobacco: Never Used  . Alcohol Use: No  . Drug Use: Not on file  . Sexually Active: Not on file   Other Topics Concern  . Not on file   Social History Narrative   HSGMarried- '39-'102 daughters- 1 died at birth, '55; 1 brother 04/18/43, 2 grandchildren and 2 great grandsLives with  daughterEnd of life care:- has a living will- DNR/DNI, no heroic measuresTobacco Use-noAlcohol Use- noDrug use-no    Family History  Problem Relation Age of Onset  . Heart disease Mother   . Cancer Mother     breast cancer  . Stroke Father   . Heart disease Father   . Heart disease Sister   . Alzheimer's disease Sister   . Heart disease Sister   . Heart disease Brother     Review of Systems:  As stated in the HPI and otherwise negative.   BP 102/59  Pulse 68  Ht 4\' 11"  (1.499 m)  Wt 129 lb (58.514 kg)  BMI 26.05 kg/m2  Physical Examination: General: Well developed, well nourished, NAD HEENT: OP clear, mucus membranes moist SKIN: warm, dry. No rashes. Neuro: No focal deficits Musculoskeletal: Muscle strength 5/5 all ext Psychiatric: Mood and affect normal Neck: No JVD, no carotid bruits, no thyromegaly, no lymphadenopathy. Lungs:Clear bilaterally, no wheezes, rhonci, crackles Cardiovascular: Regular rate and rhythm. No murmurs, gallops or rubs. Abdomen:Soft. Bowel sounds present. Non-tender.  Extremities: No lower extremity edema. Pulses are 2 + in the bilateral DP/PT.  Echo 10/22/11:  Left ventricle: The cavity size was mildly dilated. Wall thickness was normal. Systolic function was normal. The estimated ejection fraction was in the range of 55% to 60%. - Aortic valve: Mild regurgitation. - Mitral valve: The posterior leaflet is thickened, deformed and prolapsed. Severe regurgitation. - Left atrium: The atrium was moderately dilated. - Atrial septum: No defect or patent foramen ovale was identified. - Pulmonary arteries: PA peak pressure: 44mm Hg (S).

## 2011-11-24 ENCOUNTER — Other Ambulatory Visit: Payer: Self-pay

## 2011-11-24 MED ORDER — FUROSEMIDE 40 MG PO TABS: 40.0000 mg | ORAL_TABLET | Freq: Two times a day (BID) | ORAL | Status: DC

## 2011-12-13 ENCOUNTER — Telehealth: Payer: Self-pay

## 2011-12-13 NOTE — Telephone Encounter (Signed)
Pt's daughter called stating she was instructed to in inform MD when pt has completed Cipro Rxd by Dr Ferd Hibbs. Daughter is now requesting Rx for maintenance ABX, please advise.

## 2011-12-13 NOTE — Telephone Encounter (Signed)
Generic septra (not double strength) once a day, #30, refill x 5

## 2011-12-14 MED ORDER — SULFAMETHOXAZOLE-TRIMETHOPRIM 400-80 MG PO TABS: 1.0000 | ORAL_TABLET | Freq: Every day | ORAL | Status: DC

## 2011-12-14 NOTE — Telephone Encounter (Signed)
Done

## 2012-02-15 ENCOUNTER — Other Ambulatory Visit: Payer: Self-pay

## 2012-02-15 MED ORDER — CARVEDILOL 25 MG PO TABS: 25.0000 mg | ORAL_TABLET | Freq: Two times a day (BID) | ORAL | Status: DC

## 2012-04-01 ENCOUNTER — Other Ambulatory Visit: Payer: Self-pay | Admitting: *Deleted

## 2012-04-01 MED ORDER — POTASSIUM CHLORIDE 40 MEQ/15ML (20%) PO LIQD: ORAL | Status: DC

## 2012-04-29 ENCOUNTER — Other Ambulatory Visit: Payer: Self-pay | Admitting: Internal Medicine

## 2012-05-23 ENCOUNTER — Ambulatory Visit (INDEPENDENT_AMBULATORY_CARE_PROVIDER_SITE_OTHER): Payer: Medicare Other | Admitting: Cardiovascular Disease

## 2012-05-23 ENCOUNTER — Encounter: Payer: Self-pay | Admitting: Cardiovascular Disease

## 2012-05-23 VITALS — BP 125/52 | HR 72 | Ht 59.0 in | Wt 128.0 lb

## 2012-05-23 DIAGNOSIS — I340 Nonrheumatic mitral (valve) insufficiency: Secondary | ICD-10-CM

## 2012-05-23 DIAGNOSIS — I059 Rheumatic mitral valve disease, unspecified: Secondary | ICD-10-CM

## 2012-05-23 DIAGNOSIS — I080 Rheumatic disorders of both mitral and aortic valves: Secondary | ICD-10-CM

## 2012-05-23 DIAGNOSIS — I5032 Chronic diastolic (congestive) heart failure: Secondary | ICD-10-CM

## 2012-05-23 LAB — BASIC METABOLIC PANEL
CO2: 31 mEq/L (ref 19–32)
Chloride: 102 mEq/L (ref 96–112)
Potassium: 4.1 mEq/L (ref 3.5–5.1)
Sodium: 139 mEq/L (ref 135–145)

## 2012-05-23 NOTE — Patient Instructions (Addendum)
Your physician recommends that you return for lab work in: today (bmet)  Your physician recommends that you continue on your current medications as directed. Please refer to the Current Medication list given to you today.  Your physician recommends that you schedule a follow-up appointment in: 6 weeks

## 2012-05-23 NOTE — Progress Notes (Signed)
History of Present Illness: [name] is a pleasant 78 year old Caucasian female with a past medical history significant for hyperlipidemia, GERD, and severe mitral valvular regurgitation as well as diastolic dysfunction here for cardiac follow up. She was admitted to [facility] and treated for E. coli urosepsis as well as acute on chronic heart failure in January 2010 and was discharged on Lasix therapy. I last saw her in the office in January 2013. She has gotten progressively more dyspneic over the last year. Her last echo was in December  2012 and showed severe MR with calcifiied valve leaflets. I have discussed this in detail with the patient and her daughter. We briefly discussed possible MV surgery however after a long discussion with her daughter and her primary care physician, Dr. Debby Bud, it was decided that this would not be the best option.   She tells me that she feels well today. She is still dyspneic but no changes. No chest pain, near syncope or syncope. She can walk around the house well but cannot walk very far in the grocery store because she gets very dyspneic.   Primary Care Physician: Norins  Past Medical History  Diagnosis Date  . Coronary artery disease   . Peripheral neuropathy   . Aortic insufficiency     mild  . Cerumen impaction   . Unspecified diastolic heart failure   . Hyperlipemia   . Mitral valve prolapse   . Mitral regurgitation     severe  . Urosepsis   . Anemia, unspecified   . Osteoporosis   . Adenocarcinoma, breast   . Irritable bladder   . Urinary incontinence   . Hypothyroidism   . GERD (gastroesophageal reflux disease)   . Osteoarthritis   . Hx of appendectomy   . H/O: whooping cough   . History of intraocular lens implant     bilateral  . Hx  of bilateral cataract extraction   . History of left mastectomy   . History of breast biopsy   . Closed fracture of patella     repair of right knee  . Hx of tonsillectomy     Past  Surgical History  Procedure Date  . Appendectomy   . Cataract extraction w/ intraocular lens  implant, bilateral   . Mastectomy     left breast  . Breast biopsy     Current Outpatient Prescriptions  Medication Sig Dispense Refill  . aspirin 81 MG tablet Take 81 mg by mouth daily.        . calcium-vitamin D (OSCAL WITH D) 500-200 MG-UNIT per tablet Take 1 tablet by mouth 3 (three) times daily.        . carvedilol (COREG) 25 MG tablet Take 1 tablet (25 mg total) by mouth 2 (two) times daily with a meal.  60 tablet  11  . ferrous sulfate 325 (65 FE) MG EC tablet Take 325 mg by mouth as needed.       . fish oil-omega-3 fatty acids 1000 MG capsule Take 2 g by mouth daily.        . furosemide (LASIX) 40 MG tablet Take 1 tablet (40 mg total) by mouth 2 (two) times daily.  60 tablet  6  . guaiFENesin (MUCINEX) 600 MG 12 hr tablet Take 1,200 mg by mouth as needed.        . MULTIPLE VITAMINS PO Take by mouth.        . Multiple Vitamins-Minerals (PRESERVISION/LUTEIN) CAPS Take by mouth daily.        [name]  naproxen sodium (ANAPROX) 220 MG tablet Take 220 mg by mouth as needed.        . potassium chloride 40 MEQ/15ML (20%) LIQD 1 TBS daily  600 mL  3  . VESICARE 5 MG tablet TAKE ONE TABLET BY MOUTH ONE TIME DAILY  30 each  2    No Known Allergies  History   Social History  . Marital Status: Married    Spouse Name: N/A    Number of Children: N/A  . Years of Education: N/A   Occupational History  . Not on file.   Social History Main Topics  . Smoking status: Never Smoker   . Smokeless tobacco: Never Used  . Alcohol Use: No  . Drug Use: Not on file  . Sexually Active: Not on file   Other Topics Concern  . Not on file   Social History Narrative   HSGMarried- '39-'102 daughters- 1 died at birth, '58; 1 brother 04-22-43, 2 grandchildren and 2 great grandsLives with daughterEnd of life care:- has a living will- DNR/DNI, no heroic measuresTobacco Use-noAlcohol Use- noDrug use-no    Family  History  Problem Relation Age of Onset  . Heart disease Mother   . Cancer Mother     breast cancer  . Stroke Father   . Heart disease Father   . Heart disease Sister   . Alzheimer's disease Sister   . Heart disease Sister   . Heart disease Brother     Review of Systems:  As stated in the HPI and otherwise negative.   BP 125/52  Pulse 72  Ht 4\' 11"  (1.499 m)  Wt 128 lb (58.06 kg)  BMI 25.85 kg/m2  Physical Examination: General: Well developed, well nourished, NAD HEENT: OP clear, mucus membranes moist SKIN: warm, dry. No rashes. Neuro: No focal deficits Musculoskeletal: Muscle strength 5/5 all ext Psychiatric: Mood and affect normal Neck: No JVD, no carotid bruits, no thyromegaly, no lymphadenopathy. Lungs:Clear bilaterally, no wheezes, rhonci, crackles Cardiovascular: Regular rate and rhythm. No murmurs, gallops or rubs. Abdomen:Soft. Bowel sounds present. Non-tender.  Extremities: No lower extremity edema. Pulses are 2 + in the bilateral DP/PT.  Echo: December 2012:  Left ventricle: The cavity size was mildly dilated. Wall thickness was normal. Systolic function was normal. The estimated ejection fraction was in the range of 55% to 60%. - Aortic valve: Mild regurgitation. - Mitral valve: The posterior leaflet is thickened, deformed and prolapsed. Severe regurgitation. - Left atrium: The atrium was moderately dilated. - Atrial septum: No defect or patent foramen ovale was identified. - Pulmonary arteries: PA peak pressure: 44mm Hg (S).  EKG: 05/23/12: NSR, rate 72 bpm.

## 2012-05-23 NOTE — Assessment & Plan Note (Signed)
Weight stable. BP well controlled. No changes.

## 2012-05-23 NOTE — Assessment & Plan Note (Addendum)
She has severe MR. She is not a surgical candidate given her age. Her dyspnea is stable. Her weight is stable per excellent home log. Will continue Lasix 40 mg po BID. Will check BMET today since she is on Lasix and potassium. I have suggested that she consider a wheelchair for trips to the store.  I tried afterload reduction with Lisinopril but she did not tolerate. She has not wanted to start other afterload reducing agents.

## 2012-05-30 ENCOUNTER — Other Ambulatory Visit: Payer: Self-pay | Admitting: Internal Medicine

## 2012-06-14 ENCOUNTER — Other Ambulatory Visit: Payer: Self-pay

## 2012-06-14 MED ORDER — FUROSEMIDE 40 MG PO TABS: 40.0000 mg | ORAL_TABLET | Freq: Two times a day (BID) | ORAL | Status: DC

## 2012-07-04 ENCOUNTER — Ambulatory Visit: Payer: Medicare Other | Admitting: Cardiovascular Disease

## 2012-07-07 NOTE — Addendum Note (Signed)
Addended by: Burnett Kanaris A on: 07/07/2012 03:08 PM   Modules accepted: Orders

## 2012-07-20 ENCOUNTER — Ambulatory Visit (INDEPENDENT_AMBULATORY_CARE_PROVIDER_SITE_OTHER): Payer: Medicare Other | Admitting: Internal Medicine

## 2012-07-20 ENCOUNTER — Encounter: Payer: Self-pay | Admitting: Internal Medicine

## 2012-07-20 ENCOUNTER — Other Ambulatory Visit (INDEPENDENT_AMBULATORY_CARE_PROVIDER_SITE_OTHER): Payer: Medicare Other

## 2012-07-20 VITALS — BP 120/52 | HR 70 | Temp 98.0°F | Resp 14 | Wt 128.0 lb

## 2012-07-20 DIAGNOSIS — I080 Rheumatic disorders of both mitral and aortic valves: Secondary | ICD-10-CM

## 2012-07-20 DIAGNOSIS — Z0000 Encounter for general adult medical examination without abnormal findings: Secondary | ICD-10-CM

## 2012-07-20 DIAGNOSIS — N3289 Other specified disorders of bladder: Secondary | ICD-10-CM

## 2012-07-20 DIAGNOSIS — R32 Unspecified urinary incontinence: Secondary | ICD-10-CM

## 2012-07-20 DIAGNOSIS — M1990 Unspecified osteoarthritis, unspecified site: Secondary | ICD-10-CM

## 2012-07-20 DIAGNOSIS — Z23 Encounter for immunization: Secondary | ICD-10-CM | POA: Insufficient documentation

## 2012-07-20 DIAGNOSIS — D649 Anemia, unspecified: Secondary | ICD-10-CM

## 2012-07-20 LAB — CBC WITH DIFFERENTIAL/PLATELET
Basophils Absolute: 0 10*3/uL (ref 0.0–0.1)
Eosinophils Absolute: 0.2 10*3/uL (ref 0.0–0.7)
Lymphs Abs: 1.7 10*3/uL (ref 0.7–4.0)
MCHC: 33.3 g/dL (ref 30.0–36.0)
MCV: 94.3 fl (ref 78.0–100.0)
Monocytes Absolute: 0.9 10*3/uL (ref 0.1–1.0)
Neutrophils Relative %: 61.1 % (ref 43.0–77.0)
Platelets: 176 10*3/uL (ref 150.0–400.0)
RDW: 14.4 % (ref 11.5–14.6)
WBC: 7.5 10*3/uL (ref 4.5–10.5)

## 2012-07-20 MED ORDER — SOLIFENACIN SUCCINATE 5 MG PO TABS: 5.0000 mg | ORAL_TABLET | Freq: Every day | ORAL | Status: DC

## 2012-07-20 NOTE — Addendum Note (Signed)
Addended by: Elnora Morrison on: 07/20/2012 04:22 PM   Modules accepted: Orders

## 2012-07-20 NOTE — Assessment & Plan Note (Signed)
No acute flare - no synovial thickening of small joints. Able to ambulate with minimal assistance.

## 2012-07-20 NOTE — Assessment & Plan Note (Signed)
Still with some stress incontinence but not a limiting factor.

## 2012-07-20 NOTE — Assessment & Plan Note (Signed)
Severe mitral regurgitation but minimal auscultatory findings. She does have some DOE w/o SOB at rest.  Plan - continued medical management

## 2012-07-20 NOTE — Assessment & Plan Note (Signed)
For CBC today with recommendations to follow

## 2012-07-20 NOTE — Progress Notes (Signed)
Subjective:    Patient ID: [name], female    DOB: 07/26/1918, 78 y.o.   MRN: [id number]  HPI The patient is here for annual Medicare wellness examination and management of other chronic and acute problems.  CC: chronic post-nasal drainage; occasional cough; progressive,but minor, SOB in setting of advance aortic stenosis in a non-surgical patient. She has been followed closely by Dr. Sanjuana Kava.   The risk factors are reflected in the social history.  The roster of all physicians providing medical care to patient - is listed in the Snapshot section of the chart.  Activities of daily living:  The patient is 100% inedpendent in all ADLs: dressing, toileting, feeding as well as independent mobility  Home safety : The patient has smoke detectors in the home. They wear seatbelts. No firearms at home . There is no violence in the home.   There is no risks for hepatitis, STDs or HIV. There is no   history of blood transfusion. They have no travel history to infectious disease endemic areas of the world.  The patient has seen their dentist in the last six month. They have seen their eye doctor in the last year. They admit to) hearing difficulty and have not had audiologic testing in the last year.    They do not  have excessive sun exposure. Discussed the need for sun protection: hats, long sleeves and use of sunscreen if there is significant sun exposure.   Diet: the importance of a healthy diet is discussed. They do have a healthy diet.  The patient has a regular exercise program: chair exercise , 15-20 min duration, 3 per week.  The benefits of regular aerobic exercise were discussed.  Depression screen: there are no signs or vegative symptoms of depression- irritability, change in appetite, anhedonia, sadness/tearfullness.  Cognitive assessment: the patient manages all their financial and personal affairs and is actively engaged.   The following portions of the patient's history  were reviewed and updated as appropriate: allergies, current medications, past family history, past medical history,  past surgical history, past social history  and problem list.  Vision, hearing, body mass index were assessed and reviewed.   Past Medical History  Diagnosis Date  . Coronary artery disease   . Peripheral neuropathy   . Aortic insufficiency     mild  . Cerumen impaction   . Unspecified diastolic heart failure   . Hyperlipemia   . Mitral valve prolapse   . Mitral regurgitation     severe  . Urosepsis   . Anemia, unspecified   . Osteoporosis   . Adenocarcinoma, breast   . Irritable bladder   . Urinary incontinence   . Hypothyroidism   . GERD (gastroesophageal reflux disease)   . Osteoarthritis   . Hx of appendectomy   . H/O: whooping cough   . History of intraocular lens implant     bilateral  . Hx  of bilateral cataract extraction   . History of left mastectomy   . History of breast biopsy   . Closed fracture of patella     repair of right knee  . Hx of tonsillectomy    Past Surgical History  Procedure Date  . Appendectomy   . Cataract extraction w/ intraocular lens  implant, bilateral   . Mastectomy     left breast  . Breast biopsy    Family History  Problem Relation Age of Onset  . Heart disease Mother   . Cancer Mother  breast cancer  . Stroke Father   . Heart disease Father   . Heart disease Sister   . Alzheimer's disease Sister   . Heart disease Sister   . Heart disease Brother    History   Social History  . Marital Status: Married    Spouse Name: N/A    Number of Children: N/A  . Years of Education: N/A   Occupational History  . Not on file.   Social History Main Topics  . Smoking status: Never Smoker   . Smokeless tobacco: Never Used  . Alcohol Use: No  . Drug Use: Not on file  . Sexually Active: Not on file   Other Topics Concern  . Not on file   Social History Narrative   HSGMarried- '39-'102 daughters- 1 died at  birth, '19; 1 brother '44, 2 grandchildren and 2 great grandsLives with daughterEnd of life care:- has a living will- DNR/DNI, no heroic measuresTobacco Use-noAlcohol Use- noDrug use-no       Review of Systems Constitutional:  Negative for fever, chills, activity change and unexpected weight change.  HEENT:  Negative for hearing loss, ear pain, congestion, neck stiffness and postnasal drip. Negative for sore throat or swallowing problems. Negative for dental complaints.   Eyes: Negative for vision loss or change in visual acuity.  Respiratory: Negative for chest tightness and wheezing. Negative for DOE.   Cardiovascular: Negative for chest pain or palpitations. No decreased exercise tolerance Gastrointestinal: No change in bowel habit. No bloating or gas. No reflux or indigestion Genitourinary: Negative for urgency, frequency, flank pain and difficulty urinating. Mild stress incontinence Musculoskeletal: Negative for myalgias, back pain, arthralgias and gait problem.  Neurological: Negative for dizziness, tremors, weakness and headaches.  Hematological: Negative for adenopathy.  Psychiatric/Behavioral: Negative for behavioral problems and dysphoric mood.       Objective:   Physical Exam Filed Vitals:   07/20/12 1331  BP: 120/52  Pulse: 70  Temp: 98 F (36.7 C)  Resp: 14   Gen'l- WNWD young looking 78 year old in no distress HEENT- C&S clear, PERRLA Neck - supple Nodes - negative cervical region Cor - 2+ radial, RRR, no appreciable murmur, no JVD Chest - moderate kyphosis Pulm - normal breath sounds, no rales, no wheezes, no increased work of breathing Neuro - A&O x 3, CN II-XII grossly normal except for VIII- HOH. Derm - dark mole right cheek with smooth border, no varigation in color; macular, rough to touch, lesion below the left eye.       Assessment & Plan:

## 2012-07-20 NOTE — Assessment & Plan Note (Signed)
OAB - doing well with Vesicare 5 mg daily.  Plan - renewed Vesicare 5 mg dialy Rx.

## 2012-07-20 NOTE — Patient Instructions (Addendum)
You look Marvelous - pretty good condition for the condition you are in. Will send you a report and the lab results.  Come see me when you need me.

## 2012-07-20 NOTE — Assessment & Plan Note (Signed)
Interval history - unremarkable except for MR. She remains very independent in ADLs and active. Physical exam - limited - normal. Labs ordered-CBC. Bmet July '13 - normal. Immunizations - current with PNA, flu shot today.  In summary - a delightful woman who is doing fine. She will return as needed or 6 months.

## 2012-07-23 ENCOUNTER — Encounter: Payer: Self-pay | Admitting: Internal Medicine

## 2012-08-26 ENCOUNTER — Inpatient Hospital Stay (HOSPITAL_COMMUNITY)
Admission: EM | Admit: 2012-08-26 | Discharge: 2012-08-30 | DRG: 293 | Disposition: A | Discharge status: Home Health | Payer: Medicare Other | Attending: Internal Medicine | Admitting: Internal Medicine

## 2012-08-26 ENCOUNTER — Encounter (HOSPITAL_COMMUNITY): Payer: Self-pay | Admitting: *Deleted

## 2012-08-26 ENCOUNTER — Emergency Department (HOSPITAL_COMMUNITY): Payer: Medicare Other

## 2012-08-26 DIAGNOSIS — E039 Hypothyroidism, unspecified: Secondary | ICD-10-CM | POA: Diagnosis present

## 2012-08-26 DIAGNOSIS — M810 Age-related osteoporosis without current pathological fracture: Secondary | ICD-10-CM

## 2012-08-26 DIAGNOSIS — E785 Hyperlipidemia, unspecified: Secondary | ICD-10-CM | POA: Diagnosis present

## 2012-08-26 DIAGNOSIS — I080 Rheumatic disorders of both mitral and aortic valves: Secondary | ICD-10-CM

## 2012-08-26 DIAGNOSIS — M1990 Unspecified osteoarthritis, unspecified site: Secondary | ICD-10-CM | POA: Diagnosis present

## 2012-08-26 DIAGNOSIS — I5033 Acute on chronic diastolic (congestive) heart failure: Principal | ICD-10-CM | POA: Diagnosis present

## 2012-08-26 DIAGNOSIS — I509 Heart failure, unspecified: Secondary | ICD-10-CM

## 2012-08-26 DIAGNOSIS — D649 Anemia, unspecified: Secondary | ICD-10-CM | POA: Diagnosis present

## 2012-08-26 DIAGNOSIS — Z79899 Other long term (current) drug therapy: Secondary | ICD-10-CM

## 2012-08-26 DIAGNOSIS — Z7982 Long term (current) use of aspirin: Secondary | ICD-10-CM

## 2012-08-26 DIAGNOSIS — Z9010 Acquired absence of unspecified breast and nipple: Secondary | ICD-10-CM

## 2012-08-26 DIAGNOSIS — K219 Gastro-esophageal reflux disease without esophagitis: Secondary | ICD-10-CM | POA: Diagnosis present

## 2012-08-26 DIAGNOSIS — Z8639 Personal history of other endocrine, nutritional and metabolic disease: Secondary | ICD-10-CM

## 2012-08-26 DIAGNOSIS — E032 Hypothyroidism due to medicaments and other exogenous substances: Secondary | ICD-10-CM | POA: Diagnosis present

## 2012-08-26 DIAGNOSIS — I059 Rheumatic mitral valve disease, unspecified: Secondary | ICD-10-CM | POA: Diagnosis present

## 2012-08-26 DIAGNOSIS — I359 Nonrheumatic aortic valve disorder, unspecified: Secondary | ICD-10-CM

## 2012-08-26 DIAGNOSIS — I959 Hypotension, unspecified: Secondary | ICD-10-CM | POA: Diagnosis not present

## 2012-08-26 DIAGNOSIS — Z853 Personal history of malignant neoplasm of breast: Secondary | ICD-10-CM

## 2012-08-26 DIAGNOSIS — G609 Hereditary and idiopathic neuropathy, unspecified: Secondary | ICD-10-CM | POA: Diagnosis present

## 2012-08-26 DIAGNOSIS — E876 Hypokalemia: Secondary | ICD-10-CM | POA: Diagnosis not present

## 2012-08-26 DIAGNOSIS — I2510 Atherosclerotic heart disease of native coronary artery without angina pectoris: Secondary | ICD-10-CM | POA: Diagnosis present

## 2012-08-26 LAB — CBC WITH DIFFERENTIAL/PLATELET
Eosinophils Relative: 2 % (ref 0–5)
HCT: 33.3 % — ABNORMAL LOW (ref 36.0–46.0)
Hemoglobin: 11.2 g/dL — ABNORMAL LOW (ref 12.0–15.0)
Lymphocytes Relative: 12 % (ref 12–46)
Lymphs Abs: 1.3 10*3/uL (ref 0.7–4.0)
MCV: 92 fL (ref 78.0–100.0)
Monocytes Absolute: 0.6 10*3/uL (ref 0.1–1.0)
Platelets: 195 10*3/uL (ref 150–400)
RBC: 3.62 MIL/uL — ABNORMAL LOW (ref 3.87–5.11)
WBC: 10.2 10*3/uL (ref 4.0–10.5)

## 2012-08-26 LAB — URINALYSIS, ROUTINE W REFLEX MICROSCOPIC
Bilirubin Urine: NEGATIVE
Nitrite: NEGATIVE
Specific Gravity, Urine: 1.013 (ref 1.005–1.030)
pH: 5.5 (ref 5.0–8.0)

## 2012-08-26 LAB — URINE MICROSCOPIC-ADD ON

## 2012-08-26 LAB — CREATININE, SERUM: GFR calc non Af Amer: 35 mL/min — ABNORMAL LOW (ref >90)

## 2012-08-26 LAB — CBC
HCT: 32.4 % — ABNORMAL LOW (ref 36.0–46.0)
Hemoglobin: 11 g/dL — ABNORMAL LOW (ref 12.0–15.0)
MCHC: 34 g/dL (ref 30.0–36.0)
RBC: 3.54 MIL/uL — ABNORMAL LOW (ref 3.87–5.11)

## 2012-08-26 LAB — BASIC METABOLIC PANEL
CO2: 28 mEq/L (ref 19–32)
Chloride: 97 mEq/L (ref 96–112)
Potassium: 4 mEq/L (ref 3.5–5.1)
Sodium: 133 mEq/L — ABNORMAL LOW (ref 135–145)

## 2012-08-26 LAB — PRO B NATRIURETIC PEPTIDE: Pro B Natriuretic peptide (BNP): 6222 pg/mL — ABNORMAL HIGH (ref 0–450)

## 2012-08-26 MED ORDER — ASPIRIN EC 81 MG PO TBEC
81.0000 mg | DELAYED_RELEASE_TABLET | Freq: Every day | ORAL | Status: DC
  Administered 2012-08-26 – 2012-08-30 (×5): 81 mg via ORAL
  Filled 2012-08-26 (×5): qty 1

## 2012-08-26 MED ORDER — ENOXAPARIN SODIUM 40 MG/0.4ML ~~LOC~~ SOLN
40.0000 mg | SUBCUTANEOUS | Status: DC
  Administered 2012-08-26: 40 mg via SUBCUTANEOUS
  Filled 2012-08-26 (×2): qty 0.4

## 2012-08-26 MED ORDER — ASPIRIN 81 MG PO TABS: 81.0000 mg | ORAL_TABLET | Freq: Every day | ORAL | Status: DC

## 2012-08-26 MED ORDER — SODIUM CHLORIDE 0.9 % IJ SOLN
3.0000 mL | INTRAMUSCULAR | Status: DC | PRN
  Administered 2012-08-26: 3 mL via INTRAVENOUS

## 2012-08-26 MED ORDER — CALCIUM CARBONATE-VITAMIN D 500-200 MG-UNIT PO TABS
1.0000 | ORAL_TABLET | Freq: Three times a day (TID) | ORAL | Status: DC
  Administered 2012-08-26 – 2012-08-30 (×12): 1 via ORAL
  Filled 2012-08-26 (×14): qty 1

## 2012-08-26 MED ORDER — SODIUM CHLORIDE 0.9 % IV SOLN: 250.0000 mL | INTRAVENOUS | Status: DC | PRN

## 2012-08-26 MED ORDER — SODIUM CHLORIDE 0.9 % IJ SOLN
3.0000 mL | Freq: Two times a day (BID) | INTRAMUSCULAR | Status: DC
  Administered 2012-08-26 – 2012-08-29 (×7): 3 mL via INTRAVENOUS

## 2012-08-26 MED ORDER — CARVEDILOL 25 MG PO TABS
25.0000 mg | ORAL_TABLET | Freq: Two times a day (BID) | ORAL | Status: DC
  Administered 2012-08-26 – 2012-08-27 (×2): 25 mg via ORAL
  Filled 2012-08-26 (×4): qty 1

## 2012-08-26 MED ORDER — FUROSEMIDE 10 MG/ML IJ SOLN
40.0000 mg | Freq: Once | INTRAMUSCULAR | Status: AC
  Administered 2012-08-26: 40 mg via INTRAVENOUS
  Filled 2012-08-26: qty 4

## 2012-08-26 MED ORDER — OMEGA-3-ACID ETHYL ESTERS 1 G PO CAPS
1.0000 g | ORAL_CAPSULE | Freq: Two times a day (BID) | ORAL | Status: DC
  Administered 2012-08-26 – 2012-08-30 (×9): 1 g via ORAL
  Filled 2012-08-26 (×10): qty 1

## 2012-08-26 MED ORDER — OMEGA-3 FATTY ACIDS 1000 MG PO CAPS
1.0000 g | ORAL_CAPSULE | Freq: Two times a day (BID) | ORAL | Status: DC
Start: 2012-08-26 — End: 2012-08-26

## 2012-08-26 MED ORDER — ONDANSETRON HCL 4 MG/2ML IJ SOLN: 4.0000 mg | Freq: Four times a day (QID) | INTRAMUSCULAR | Status: DC | PRN

## 2012-08-26 MED ORDER — FUROSEMIDE 10 MG/ML IJ SOLN
40.0000 mg | Freq: Two times a day (BID) | INTRAMUSCULAR | Status: DC
  Administered 2012-08-26 – 2012-08-27 (×2): 40 mg via INTRAVENOUS
  Filled 2012-08-26 (×3): qty 4

## 2012-08-26 MED ORDER — CYCLOSPORINE 0.05 % OP EMUL
1.0000 [drp] | Freq: Two times a day (BID) | OPHTHALMIC | Status: DC
  Administered 2012-08-26 – 2012-08-30 (×8): 1 [drp] via OPHTHALMIC
  Filled 2012-08-26 (×10): qty 1

## 2012-08-26 MED ORDER — ACETAMINOPHEN 325 MG PO TABS: 650.0000 mg | ORAL_TABLET | ORAL | Status: DC | PRN

## 2012-08-26 MED ORDER — LOTEPREDNOL ETABONATE 0.5 % OP SUSP
1.0000 [drp] | Freq: Two times a day (BID) | OPHTHALMIC | Status: DC
  Filled 2012-08-26: qty 5

## 2012-08-26 MED ORDER — SODIUM CHLORIDE 0.9 % IV SOLN
INTRAVENOUS | Status: AC
  Administered 2012-08-26: 16:00:00 via INTRAVENOUS

## 2012-08-26 MED ORDER — DARIFENACIN HYDROBROMIDE ER 7.5 MG PO TB24
7.5000 mg | ORAL_TABLET | Freq: Every day | ORAL | Status: DC
  Administered 2012-08-26 – 2012-08-30 (×5): 7.5 mg via ORAL
  Filled 2012-08-26 (×5): qty 1

## 2012-08-26 NOTE — ED Notes (Signed)
Py has had exertional shob for 4-5 days, worse this am

## 2012-08-26 NOTE — ED Notes (Signed)
Son remains at bedside. States pt gets shob with any movement or speaking

## 2012-08-26 NOTE — ED Notes (Signed)
Report called to [place], RN

## 2012-08-26 NOTE — ED Notes (Signed)
O2 applied via [facility] with O2 sat up to 93%. Son at bedside

## 2012-08-26 NOTE — ED Provider Notes (Signed)
History     CSN: [id number]  Arrival date & time 08/26/12  4098   First MD Initiated Contact with Patient 08/26/12 [phone]      Chief Complaint  Patient presents with  . Respiratory Distress    (Consider location/radiation/quality/duration/timing/severity/associated sxs/prior treatment) The history is provided by the patient.   78 year old, female, with a history of congestive heart failure.  Coronary artery disease, and aortic insufficiency.  He presents emergency department complaining of progressive shortness of breath.  For the past several days.  She denies pain anywhere.  She has not had fever, chills, or sweating.  She has not had a cough.  She denies lower extremity, swelling, or pain.  She does not use oxygen at home.  Level V caveat applies for respiratory distress  Past Medical History  Diagnosis Date  . Coronary artery disease   . Peripheral neuropathy   . Aortic insufficiency     mild  . Cerumen impaction   . Unspecified diastolic heart failure   . Hyperlipemia   . Mitral valve prolapse   . Mitral regurgitation     severe  . Urosepsis   . Anemia, unspecified   . Osteoporosis   . Adenocarcinoma, breast   . Irritable bladder   . Urinary incontinence   . Hypothyroidism   . GERD (gastroesophageal reflux disease)   . Osteoarthritis   . Hx of appendectomy   . H/O: whooping cough   . History of intraocular lens implant     bilateral  . Hx  of bilateral cataract extraction   . History of left mastectomy   . History of breast biopsy   . Closed fracture of patella     repair of right knee  . Hx of tonsillectomy     Past Surgical History  Procedure Date  . Appendectomy   . Cataract extraction w/ intraocular lens  implant, bilateral   . Mastectomy     left breast  . Breast biopsy     Family History  Problem Relation Age of Onset  . Heart disease Mother   . Cancer Mother     breast cancer  . Stroke Father   . Heart disease Father   . Heart disease  Sister   . Alzheimer's disease Sister   . Heart disease Sister   . Heart disease Brother     History  Substance Use Topics  . Smoking status: Never Smoker   . Smokeless tobacco: Never Used  . Alcohol Use: No    OB History    Grav Para Term Preterm Abortions TAB SAB Ect Mult Living                  Review of Systems  Unable to perform ROS Constitutional: Negative for fever, chills and diaphoresis.  HENT: Negative for congestion.   Respiratory: Positive for shortness of breath. Negative for cough and chest tightness.   Cardiovascular: Negative for chest pain and leg swelling.  Gastrointestinal: Negative for nausea and vomiting.  Psychiatric/Behavioral: Negative for confusion.  All other systems reviewed and are negative.    Allergies  Review of patient's allergies indicates no known allergies.  Home Medications   Current Outpatient Rx  Name Route Sig Dispense Refill  . ASPIRIN 81 MG PO TABS Oral Take 81 mg by mouth daily.      [name] CALCIUM CARBONATE-VITAMIN D 500-200 MG-UNIT PO TABS Oral Take 1 tablet by mouth 3 (three) times daily.      [name] CARVEDILOL 25  MG PO TABS Oral Take 1 tablet (25 mg total) by mouth 2 (two) times daily with a meal. 60 tablet 11  . CYCLOSPORINE 0.05 % OP EMUL  1 drop 2 (two) times daily.    . OMEGA-3 FATTY ACIDS 1000 MG PO CAPS Oral Take 2 g by mouth daily.      . FUROSEMIDE 40 MG PO TABS Oral Take 1 tablet (40 mg total) by mouth 2 (two) times daily. 60 tablet 6  . GUAIFENESIN ER 600 MG PO TB12 Oral Take 1,200 mg by mouth as needed.      [name] LOTEPREDNOL ETABONATE 0.5 % OP SUSP Both Eyes Place 1 drop into both eyes 2 (two) times daily.    . MULTIPLE VITAMINS PO Oral Take 1 tablet by mouth daily.     [name] PRESERVISION/LUTEIN PO CAPS Oral Take 1 capsule by mouth 2 (two) times daily.     [name] NAPROXEN SODIUM 220 MG PO TABS Oral Take 220 mg by mouth as needed.      . NON FORMULARY  Estrace cream  Add very little to outer layer of vaginal area    . POTASSIUM  CHLORIDE 40 MEQ/15ML (20%) PO LIQD  1 TBS daily 600 mL 3  . SOLIFENACIN SUCCINATE 5 MG PO TABS Oral Take 1 tablet (5 mg total) by mouth daily. 90 tablet 3  . SULFAMETHOXAZOLE-TRIMETHOPRIM 400-80 MG PO TABS Oral Take 1 tablet by mouth 2 (two) times daily.    . SULFAMETHOXAZOLE-TRIMETHOPRIM 400-80 MG PO TABS  take 1 tablet by mouth once daily 30 tablet 5    BP 143/71  Pulse 66  Temp 98.2 F (36.8 C) (Oral)  Resp 23  SpO2 95%  Physical Exam  Nursing note and vitals reviewed. Constitutional: She is oriented to person, place, and time. She appears well-developed and well-nourished. She appears distressed.  HENT:  Head: Normocephalic and atraumatic.  Eyes: Conjunctivae normal and EOM are normal.  Neck: Normal range of motion. Neck supple. No JVD present.  Cardiovascular: Normal rate, regular rhythm and intact distal pulses.   Murmur heard. Pulmonary/Chest: She is in respiratory distress. She has rales.  Abdominal: Soft. She exhibits no distension. There is no tenderness.  Musculoskeletal: Normal range of motion. She exhibits no edema and no tenderness.  Neurological: She is alert and oriented to person, place, and time. No cranial nerve deficit.  Skin: Skin is warm and dry.  Psychiatric: She has a normal mood and affect. Thought content normal.    ED Course  Procedures (including critical care time) 78 year old, female, with history of congestive heart failure, presents with progressive shortness of breath.  Over several days.  She is in respiratory distress.  Has dyspnea with conversation and has rales.  On examination.  We'll perform a chest x-ray, and laboratory testing, for evaluation.   Labs Reviewed  BASIC METABOLIC PANEL  CBC WITH DIFFERENTIAL   No results found.   No diagnosis found.  ECG. Normal sinus rhythm at 72 beats per minute. Right axis deviation. Right bundle branch block. Normal.  ST, T-wave Right bundle branch block is new compared to 02/05/2011  12:11  PM Spoke with Dr. Isidoro Donning.  She will admit for tx of chf.  MDM  Dyspnea chf         Cheri Guppy, MD 08/26/12 [phone]

## 2012-08-26 NOTE — H&P (Signed)
History and Physical       Hospital Admission Note Date: 08/26/2012  Patient name: [name] Medical record number: [id number] Date of birth: 12-03-1917 Age: 78 y.o. Gender: female PCP: Illene Regulus, MD  Primary cardiologist: Ulyess Mort, Dr. Clifton James  Chief Complaint:   Shortness of breath worsening over the last few weeks  HPI:  Patient is a 78 year old female with history of a rare disease, chronic diastolic CHF, severe mitral regurgitation, mild AI, hyperlipidemia, presented to the ED with shortness of breath. History is obtained from the patient and her family (son and daughter) present in the room. Patient lives with her daughter. Per patient's son, she has baseline shortness of breath however was able to walk inside the house, in the last few weeks however the shortness of breath has been progressively worsening. In the last 2 days patient has been feeling short of breath even on conversation. Otherwise she denied any fever, chills, productive cough, nausea, vomiting, abdominal pain. The appetite has been down for last few days. She does not use any oxygen at home. Patient states that she is compliant with her low salt diet and Lasix twice a day. She endorses some chest tightness with shortness of but no specific chest pain.   Review of Systems:  Constitutional: Denies fever, chills, diaphoresis, + appetite change and fatigue.  HEENT: Denies photophobia, eye pain, redness, hearing loss, ear pain, congestion, sore throat, rhinorrhea, sneezing, mouth sores, trouble swallowing, neck pain, neck stiffness and tinnitus.   Respiratory: Denies  coughing and wheezing   Cardiovascular: please see history of present illness  Gastrointestinal: Denies nausea, vomiting, abdominal pain, diarrhea, constipation, blood in stool and abdominal distention.  Genitourinary: Denies dysuria, urgency, frequency, hematuria, flank pain and difficulty  urinating.  Musculoskeletal: Denies myalgias, back pain, joint swelling, arthralgias and gait problem.  patient ambulates with a cane  Skin: Denies pallor, rash and wound.  Neurological: Denies dizziness, seizures, syncope, weakness, light-headedness, numbness and headaches.  Hematological: Denies adenopathy. Easy bruising, personal or family bleeding history  Psychiatric/Behavioral: Denies suicidal ideation, mood changes, confusion, nervousness, sleep disturbance and agitation  Past Medical History: Past Medical History  Diagnosis Date  . Coronary artery disease   . Peripheral neuropathy   . Aortic insufficiency     mild  . Cerumen impaction   . Unspecified diastolic heart failure   . Hyperlipemia   . Mitral valve prolapse   . Mitral regurgitation     severe  . Urosepsis   . Anemia, unspecified   . Osteoporosis   . Adenocarcinoma, breast   . Irritable bladder   . Urinary incontinence   . Hypothyroidism   . GERD (gastroesophageal reflux disease)   . Osteoarthritis   . Hx of appendectomy   . H/O: whooping cough   . History of intraocular lens implant     bilateral  . Hx  of bilateral cataract extraction   . History of left mastectomy   . History of breast biopsy   . Closed fracture of patella     repair of right knee  . Hx of tonsillectomy    Past Surgical History  Procedure Date  . Appendectomy   . Cataract extraction w/ intraocular lens  implant, bilateral   . Mastectomy     left breast  . Breast biopsy     Medications: Prior to Admission medications   Medication Sig Start Date End Date Taking? Authorizing Provider  aspirin 81 MG tablet Take 81 mg by mouth daily.  Yes Historical Provider, MD  calcium-vitamin D (OSCAL WITH D) 500-200 MG-UNIT per tablet Take 1 tablet by mouth 3 (three) times daily.     Yes Historical Provider, MD  carvedilol (COREG) 25 MG tablet Take 25 mg by mouth 2 (two) times daily with a meal. 02/15/12  Yes Pricilla Riffle, MD  cycloSPORINE  (RESTASIS) 0.05 % ophthalmic emulsion Place 1 drop into both eyes 2 (two) times daily.    Yes Historical Provider, MD  fish oil-omega-3 fatty acids 1000 MG capsule Take 1 g by mouth 2 (two) times daily.    Yes Historical Provider, MD  furosemide (LASIX) 40 MG tablet Take 40 mg by mouth 2 (two) times daily. 06/14/12  Yes Kathleene Hazel, MD  loteprednol (LOTEMAX) 0.5 % ophthalmic suspension Place 1 drop into both eyes 2 (two) times daily.   Yes Historical Provider, MD  Multiple Vitamins-Minerals (ICAPS) CAPS Take 1 capsule by mouth 4 (four) times daily.   Yes Historical Provider, MD  NON FORMULARY Estrace cream  Add very little to outer layer of vaginal area   Yes Historical Provider, MD  potassium chloride 40 MEQ/15ML (20%) LIQD Take by mouth daily.   Yes Historical Provider, MD  solifenacin (VESICARE) 5 MG tablet Take 5 mg by mouth daily. 07/20/12  Yes Jacques Navy, MD  sulfamethoxazole-trimethoprim (BACTRIM,SEPTRA) 400-80 MG per tablet Take 1 tablet by mouth 2 (two) times daily.   Yes Historical Provider, MD  guaiFENesin (MUCINEX) 600 MG 12 hr tablet Take 1,200 mg by mouth as needed. For congestion    Historical Provider, MD    Allergies:  No Known Allergies  Social History:  reports that she has never smoked. She has never used smokeless tobacco. She reports that she does not drink alcohol. Her drug history not on file.  Family History: Family History  Problem Relation Age of Onset  . Heart disease Mother   . Cancer Mother     breast cancer  . Stroke Father   . Heart disease Father   . Heart disease Sister   . Alzheimer's disease Sister   . Heart disease Sister   . Heart disease Brother     Physical Exam: Blood pressure 152/128, pulse 71, temperature 98.2 F (36.8 C), temperature source Oral, resp. rate 22, SpO2 99.00%. General: Alert, awake, oriented x3, in no acute distress. HEENT: normocephalic, atraumatic, anicteric sclera, pink conjunctiva, pupils equal and  reactive to light and accomodation, oropharynx clear Neck: supple, no masses or lymphadenopathy, no goiter, no bruits  Heart: Regular rate and rhythm, loud systolic murmur Lungs: Rales to the mid lungs bilaterally  Abdomen: Soft, nontender, nondistended, positive bowel sounds, no masses. Extremities: No clubbing, cyanosis or edema with positive pedal pulses. Neuro: Grossly intact, no focal neurological deficits, strength 5/5 upper and lower extremities bilaterally Psych: alert and oriented x 3, normal mood and affect Skin: no rashes or lesions, warm and dry   LABS on Admission:  Basic Metabolic Panel:  Lab 08/26/12 1610  NA 133*  K 4.0  CL 97  CO2 28  GLUCOSE 130*  BUN 23  CREATININE 1.36*  CALCIUM 9.0  MG --  PHOS --   CBC:  Lab 08/26/12 0957  WBC 10.2  NEUTROABS 8.1*  HGB 11.2*  HCT 33.3*  MCV 92.0  PLT 195     Radiological Exams on Admission: Dg Chest Port 1 View  08/26/2012  *RADIOLOGY REPORT*  Clinical Data: 78 year old female with shortness of breath.  PORTABLE CHEST - 1  VIEW  Comparison: 02/23/2011 and prior chest radiographs  Findings: Cardiomegaly is noted. Increased interstitial opacities bilaterally are noted suspicious for pulmonary edema. There is no evidence of pleural effusion, pulmonary mass or pneumothorax. Evidence of left breast/axillary surgery noted. No acute or suspicious bony abnormalities are identified.  IMPRESSION: Cardiomegaly with new mild interstitial opacities suspicious for interstitial edema.  Interstitial infection is considered less likely.   Original Report Authenticated By: Rosendo Gros, M.D.     Assessment/Plan Principal Problem:  *Diastolic CHF, acute on chronic:  Currently stage IV, patient is dyspneic at rest, denies any specific chest pain, chest x-ray consistent with interstitial edema with elevated BNP.  - Admit to telemetry, placed on CHF pathway, place on IV Lasix for diuresis, continue beta blocker .  - Strict I.'s and  O.'s and daily weights, 2D Echo, serial troponin - Patient has not tolerated ACE inhibitor before per Dr. Gibson Ramp note office visit (7/13) - Patient will likely benefit from home health PT, RN followup for CHF    Active Problems:  HYPERLIPIDEMIA: cont fish oil   UNSPECIFIED ANEMIA: Hemoglobin at baseline   MITRAL REGURGITATION, SEVERE with coronary artery disease:  - Reviewed cardiology notes, patient is not a surgical candidate, patient and family are aware of it.  - Cont ASA, BB, diuresis   GASTROESOPHAGEAL REFLUX DISEASE: Continue PPI   OSTEOARTHRITIS:  - Continue vitamin D. calcium supplementation, pain control    HYPOTHYROIDISM, HX OF - Patient is not on any thyroid replacement, obtain TSH   DVT prophylaxis: lovenox  CODE STATUS:   Further plan will depend as patient's clinical course evolves and further radiologic and laboratory data become available.   Time Spent on Admission: 1 hour  Zarielle Cea M.D. [facility] 08/26/2012, 1:11 PM Pager: [phone]  If 7PM-7AM, please contact night-coverage [email or web address] Password [email or web address]

## 2012-08-26 NOTE — ED Notes (Signed)
Lab at bedside

## 2012-08-27 ENCOUNTER — Encounter (HOSPITAL_COMMUNITY): Payer: Self-pay | Admitting: *Deleted

## 2012-08-27 ENCOUNTER — Inpatient Hospital Stay (HOSPITAL_COMMUNITY): Payer: Medicare Other

## 2012-08-27 DIAGNOSIS — I509 Heart failure, unspecified: Secondary | ICD-10-CM

## 2012-08-27 DIAGNOSIS — I5033 Acute on chronic diastolic (congestive) heart failure: Principal | ICD-10-CM

## 2012-08-27 LAB — URINE CULTURE

## 2012-08-27 LAB — BASIC METABOLIC PANEL
CO2: 29 mEq/L (ref 19–32)
Chloride: 97 mEq/L (ref 96–112)
Creatinine, Ser: 1.34 mg/dL — ABNORMAL HIGH (ref 0.50–1.10)
GFR calc Af Amer: 39 mL/min — ABNORMAL LOW (ref >90)
Potassium: 3.2 mEq/L — ABNORMAL LOW (ref 3.5–5.1)
Sodium: 136 mEq/L (ref 135–145)

## 2012-08-27 LAB — CBC
HCT: 31.8 % — ABNORMAL LOW (ref 36.0–46.0)
RBC: 3.46 MIL/uL — ABNORMAL LOW (ref 3.87–5.11)
RDW: 14.2 % (ref 11.5–15.5)
WBC: 7.3 10*3/uL (ref 4.0–10.5)

## 2012-08-27 LAB — TROPONIN I: Troponin I: <0.3 ng/mL (ref <0.30)

## 2012-08-27 MED ORDER — FUROSEMIDE 10 MG/ML IJ SOLN
40.0000 mg | Freq: Two times a day (BID) | INTRAMUSCULAR | Status: DC
  Filled 2012-08-27 (×2): qty 4

## 2012-08-27 MED ORDER — ENOXAPARIN SODIUM 30 MG/0.3ML ~~LOC~~ SOLN
30.0000 mg | SUBCUTANEOUS | Status: DC
  Administered 2012-08-27 – 2012-08-29 (×3): 30 mg via SUBCUTANEOUS
  Filled 2012-08-27 (×4): qty 0.3

## 2012-08-27 MED ORDER — CARVEDILOL 12.5 MG PO TABS
12.5000 mg | ORAL_TABLET | Freq: Two times a day (BID) | ORAL | Status: DC
  Administered 2012-08-28 – 2012-08-30 (×5): 12.5 mg via ORAL
  Filled 2012-08-27 (×8): qty 1

## 2012-08-27 NOTE — Progress Notes (Signed)
Subjective: [name] admitted for increased SOB with radiologic evidence of mild fluid overload. Per her son she was very short of breath and is doing better this AM. [name] states that she feels better.  Objective: Lab: Lab Results  Component Value Date   WBC 7.3 08/27/2012   HGB 10.5* 08/27/2012   HCT 31.8* 08/27/2012   MCV 91.9 08/27/2012   PLT 183 08/27/2012   BMET    Component Value Date/Time   NA 136 08/27/2012 0129   K 3.2* 08/27/2012 0129   CL 97 08/27/2012 0129   CO2 29 08/27/2012 0129   GLUCOSE 98 08/27/2012 0129   BUN 21 08/27/2012 0129   CREATININE 1.34* 08/27/2012 0129   CALCIUM 9.1 08/27/2012 0129   GFRNONAA 33* 08/27/2012 0129   GFRAA 39* 08/27/2012 0129   pBNP 6,222   Imaging:  Scheduled Meds:   . sodium chloride   Intravenous STAT  . aspirin EC  81 mg Oral Daily  . calcium-vitamin D  1 tablet Oral TID  . carvedilol  25 mg Oral BID WC  . cycloSPORINE  1 drop Both Eyes BID  . darifenacin  7.5 mg Oral Daily  . enoxaparin  30 mg Subcutaneous Q24H  . furosemide  40 mg Intravenous Once  . furosemide  40 mg Intravenous BID  . loteprednol  1 drop Both Eyes BID  . omega-3 acid ethyl esters  1 g Oral BID  . sodium chloride  3 mL Intravenous Q12H  . DISCONTD: aspirin  81 mg Oral Daily  . DISCONTD: enoxaparin  40 mg Subcutaneous Q24H  . DISCONTD: fish oil-omega-3 fatty acids  1 g Oral BID   Continuous Infusions:  PRN Meds:.sodium chloride, acetaminophen, ondansetron (ZOFRAN) IV, sodium chloride   Physical Exam: Filed Vitals:   08/27/12 0540  BP: 100/40  Pulse: 64  Temp: 97.8 F (36.6 C)  Resp: 18    Intake/Output Summary (Last 24 hours) at 08/27/12 1235 Last data filed at 08/27/12 0949  Gross per 24 hour  Intake    611 ml  Output      0 ml  Net    611 ml   Elderly white woman in no distress HEENT- C&S clear clear Neck - supple, no JVD Cor- RRR, III/VI holosystolic murmur PUlm - bibasilar rales, no wheezes Abd-  soft      Assessment/Plan: 1. Cardiac - Patient well known to cardilogy with inoperable aortic stenosis with  Chronic heart failure who presents with increased shortness of breath with ;ulmonary edema by x-ray and elevated in BNP to 6,222. Since admission she has had approx 1200 cc out with a balance of -600 estimated. Weight has gone down fromn 124 to 120 lbs. She is breathing better  Plan Continue present therapy  AM lab with BNP, Bmet  Continue I&Os and daily weights  Cardiology consult prior to discharge to finalize her medical regimen   Illene Regulus [facility] (o) [phone]; (c) [phone] Call-grp - [name] Tele: [phone]  08/27/2012, 12:26 PM

## 2012-08-27 NOTE — Progress Notes (Signed)
Physical Therapy Evaluation Patient Details Name: [name] MRN: [id number] DOB: 1917/12/20 Today's Date: 08/27/2012 Time: 1191-4782 PT Time Calculation (min): 31 min  PT Assessment / Plan / Recommendation Clinical Impression  78 yo female admitted with SOB, CHF exacerbation; Presents with decr activity tol secondary to DOE; Will benefit form PT to maximize independence and safety with mobility and activity tol, and to facilitate dc planning    PT Assessment  Patient needs continued PT services    Follow Up Recommendations  Home health PT;Supervision/Assistance - 24 hour    Does the patient have the potential to tolerate intense rehabilitation      Barriers to Discharge None      Equipment Recommendations  3 in 1 bedside comode    Recommendations for Other Services OT consult   Frequency Min 3X/week    Precautions / Restrictions Precautions Precautions: Fall (SOB with exertion) Restrictions Weight Bearing Restrictions: No   Pertinent Vitals/Pain Amb on Room Air   Patient Saturations on Room Air while Ambulating = 88-90% Restarted O2 2 L and sats incr to 95%      Mobility  Bed Mobility Bed Mobility: Supine to Sit;Sitting - Scoot to Edge of Bed Supine to Sit: 4: Min assist;With rails Sitting - Scoot to [place] of Bed: 4: Min guard Details for Bed Mobility Assistance: Cues to initiate, and for technique Transfers Transfers: Sit to Stand;Stand to Sit Sit to Stand: 4: Min guard;From bed Stand to Sit: 4: Min guard;To toilet;To chair/3-in-1;With upper extremity assist Details for Transfer Assistance: Cues for safety and hand placement Ambulation/Gait Ambulation/Gait Assistance: 4: Min assist Ambulation Distance (Feet): 25 Feet Assistive device: 1 person hand held assist Ambulation/Gait Assistance Details: Provided unilateral 1 person handheld assist as pt tends to use cane instead of RW; Somewhat unsteady, and noted incr dyspnea with exertion; Cues to  self-monitor for activity tol Gait Pattern: Decreased stride length (somewhat erratic step width)    Shoulder Instructions     Exercises     PT Diagnosis: Difficulty walking;Generalized weakness  PT Problem List: Decreased strength;Decreased activity tolerance;Decreased balance;Decreased mobility;Decreased knowledge of use of DME;Decreased safety awareness;Cardiopulmonary status limiting activity PT Treatment Interventions: DME instruction;Gait training;Stair training;Functional mobility training;Therapeutic activities;Therapeutic exercise;Patient/family education   PT Goals Acute Rehab PT Goals PT Goal Formulation: With patient Time For Goal Achievement: 09/10/12 Potential to Achieve Goals: Good Pt will go Supine/Side to Sit: with modified independence PT Goal: Supine/Side to Sit - Progress: Goal set today Pt will go Sit to Supine/Side: with modified independence PT Goal: Sit to Supine/Side - Progress: Goal set today Pt will go Sit to Stand: with modified independence PT Goal: Sit to Stand - Progress: Goal set today Pt will go Stand to Sit: with modified independence PT Goal: Stand to Sit - Progress: Goal set today Pt will Ambulate: 51 - 150 feet;with supervision;with least restrictive assistive device;with rolling walker PT Goal: Ambulate - Progress: Goal set today Pt will Go Up / Down Stairs: 3-5 stairs;with min assist;with rail(s) PT Goal: Up/Down Stairs - Progress: Goal set today  Visit Information  Last PT Received On: 08/27/12 Assistance Needed: +1    Subjective Data  Subjective: Agreeable to amb; reports feels much better Patient Stated Goal: home   Prior Functioning  Home Living Lives With: Daughter Available Help at Discharge: Family;Available 24 hours/day Type of Home: House Home Access: Stairs to enter [employer] of Steps: 3 Entrance Stairs-Rails: Right;Left (usually uses only 1 rail and cane) Home Layout: Two level;Able to live  on main level with  bedroom/bathroom Bathroom Shower/Tub: Tub/shower unit Home Adaptive Equipment: Walker - rolling;Straight cane;Bedside commode/3-in-1 (Chair for tub (?bench or seat?)) Prior Function Level of Independence: Independent with assistive device(s) Able to Take Stairs?: Yes Driving: No Comments: Household ambulation cruising furniture; community amb with cane and 1 person assist; Walked in [employer] for exercise until tended to get short of breath (approx 1 month ago) Communication Communication: No difficulties    Cognition  Overall Cognitive Status: Appears within functional limits for tasks assessed/performed Arousal/Alertness: Awake/alert Orientation Level: Appears intact for tasks assessed Behavior During Session: [facility] for tasks performed    Extremity/Trunk Assessment Right Upper Extremity Assessment RUE ROM/Strength/Tone: [facility] for tasks assessed Left Upper Extremity Assessment LUE ROM/Strength/Tone: WFL for tasks assessed Right Lower Extremity Assessment RLE ROM/Strength/Tone: Deficits RLE ROM/Strength/Tone Deficits: Generally weak Left Lower Extremity Assessment LLE ROM/Strength/Tone: Deficits LLE ROM/Strength/Tone Deficits: Generally weak Trunk Assessment Trunk Assessment: Kyphotic   Balance    End of Session PT - End of Session Equipment Utilized During Treatment: Gait belt Activity Tolerance: Patient tolerated treatment well Patient left: in chair;with call bell/phone within reach;with family/visitor present Nurse Communication: Mobility status  GP     Olen Pel [place], [place] [phone]  08/27/2012, 2:51 PM

## 2012-08-27 NOTE — Progress Notes (Signed)
Utilization Review Completed.  

## 2012-08-27 NOTE — Consult Note (Signed)
Reason for Consult:sob  Referring Physician: Norrins  [name] is an 78 y.o. female.   HPI: 78 yo woman with a h/o severe MR, chronic systolic CHF admitted with worsening CHF. She has had progressive dyspnea for 2 weeks. She denies dietary or medical non-compliance. She has had a gradual decline over the past 6 months with less activity. No syncope. She has mild peripheral edema. With administration of IV lasix, she has had improvement.   PMH: Past Medical History  Diagnosis Date  . Coronary artery disease   . Peripheral neuropathy   . Aortic insufficiency     mild  . Cerumen impaction   . Unspecified diastolic heart failure   . Hyperlipemia   . Mitral valve prolapse   . Mitral regurgitation     severe  . Urosepsis   . Anemia, unspecified   . Osteoporosis   . Adenocarcinoma, breast   . Irritable bladder   . Urinary incontinence   . Hypothyroidism   . GERD (gastroesophageal reflux disease)   . Osteoarthritis   . Hx of appendectomy   . H/O: whooping cough   . History of intraocular lens implant     bilateral  . Hx  of bilateral cataract extraction   . History of left mastectomy   . History of breast biopsy   . Closed fracture of patella     repair of right knee  . Hx of tonsillectomy     PSHX: Past Surgical History  Procedure Date  . Appendectomy   . Cataract extraction w/ intraocular lens  implant, bilateral   . Mastectomy     left breast  . Breast biopsy     FAMHX: Family History  Problem Relation Age of Onset  . Heart disease Mother   . Cancer Mother     breast cancer  . Stroke Father   . Heart disease Father   . Heart disease Sister   . Alzheimer's disease Sister   . Heart disease Sister   . Heart disease Brother     Social History:  reports that she has never smoked. She has never used smokeless tobacco. She reports that she does not drink alcohol. Her drug history not on file.  Allergies: No Known Allergies  Medications:  reviewed  Dg Chest Port 1 View  08/26/2012  *RADIOLOGY REPORT*  Clinical Data: 78 year old female with shortness of breath.  PORTABLE CHEST - 1 VIEW  Comparison: 02/23/2011 and prior chest radiographs  Findings: Cardiomegaly is noted. Increased interstitial opacities bilaterally are noted suspicious for pulmonary edema. There is no evidence of pleural effusion, pulmonary mass or pneumothorax. Evidence of left breast/axillary surgery noted. No acute or suspicious bony abnormalities are identified.  IMPRESSION: Cardiomegaly with new mild interstitial opacities suspicious for interstitial edema.  Interstitial infection is considered less likely.   Original Report Authenticated By: Rosendo Gros, M.D.     ROS  As stated in the HPI and negative for all other systems.  Physical Exam  Vitals:Blood pressure 85/41, pulse 64, temperature 97.1 F (36.2 C), temperature source Oral, resp. rate 20, height 4\' 11"  (1.499 m), weight 120 lb 6.4 oz (54.613 kg), SpO2 97.00%.  Well appearing NAD HEENT: Unremarkable Neck:  8 cm JVD, no thyromegally Lungs:  Rales bilaterally half way up. HEART:  Regular rate rhythm, 3/6 MR murmur, no rubs, no clicks Abd:  soft, positive bowel sounds, no organomegally, no rebound, no guarding Ext:  2 plus pulses, trace edema, no cyanosis, no clubbing Skin:  No rashes no nodules Neuro:  CN II through XII intact, motor grossly intact  Assessment/Plan: 1. Acute on chronic diastolic CHF 2. Severe mitral regurge, not a surgical candidate Rec: iv diuresis as her blood pressure allows. Watch after load reduction for hypotension.  Sharlot Gowda TaylorMD 08/27/2012, 4:06 PM

## 2012-08-28 DIAGNOSIS — I059 Rheumatic mitral valve disease, unspecified: Secondary | ICD-10-CM

## 2012-08-28 LAB — BASIC METABOLIC PANEL
Calcium: 8.9 mg/dL (ref 8.4–10.5)
Creatinine, Ser: 1.22 mg/dL — ABNORMAL HIGH (ref 0.50–1.10)
GFR calc Af Amer: 43 mL/min — ABNORMAL LOW (ref >90)
Sodium: 136 mEq/L (ref 135–145)

## 2012-08-28 LAB — PRO B NATRIURETIC PEPTIDE: Pro B Natriuretic peptide (BNP): 4352 pg/mL — ABNORMAL HIGH (ref 0–450)

## 2012-08-28 LAB — CBC
MCH: 30.7 pg (ref 26.0–34.0)
MCHC: 32.9 g/dL (ref 30.0–36.0)
Platelets: 173 10*3/uL (ref 150–400)
RBC: 3.29 MIL/uL — ABNORMAL LOW (ref 3.87–5.11)

## 2012-08-28 MED ORDER — FUROSEMIDE 10 MG/ML IJ SOLN
40.0000 mg | Freq: Three times a day (TID) | INTRAMUSCULAR | Status: DC
  Administered 2012-08-28 – 2012-08-29 (×4): 40 mg via INTRAVENOUS
  Filled 2012-08-28 (×4): qty 4

## 2012-08-28 MED ORDER — POTASSIUM CHLORIDE CRYS ER 20 MEQ PO TBCR
20.0000 meq | EXTENDED_RELEASE_TABLET | Freq: Two times a day (BID) | ORAL | Status: DC
  Administered 2012-08-28 – 2012-08-30 (×5): 20 meq via ORAL
  Filled 2012-08-28 (×6): qty 1

## 2012-08-28 MED ORDER — POTASSIUM CHLORIDE CRYS ER 20 MEQ PO TBCR
40.0000 meq | EXTENDED_RELEASE_TABLET | Freq: Once | ORAL | Status: AC
  Administered 2012-08-28: 40 meq via ORAL
  Filled 2012-08-28: qty 2

## 2012-08-28 NOTE — Progress Notes (Signed)
Patient Name: [name] Date of Encounter: 08/28/2012     Principal Problem:  *Diastolic CHF, acute on chronic Active Problems:  HYPERLIPIDEMIA  UNSPECIFIED ANEMIA  MITRAL REGURGITATION, SEVERE  CORONARY ARTERY DISEASE  GASTROESOPHAGEAL REFLUX DISEASE  OSTEOARTHRITIS  HYPOTHYROIDISM, HX OF    SUBJECTIVE  78 year old female with above problem list and c/o increased SOB. Her SOB has not improved today per patient and her family, who are present at bedside. States she feels no better than she did yesterday, and has since developed a non-productive cough, for which she take mucinex at home with relief. Lasix was held yesterday afternoon due to hypotension. Sa02 sat was stable with ambulation this morning, but she experienced SOB and needed to sit to rest once. BNP yesterday 4352 down from 6222 10/26. Previous 24 hour I/O is -35mL She is -709, mL for this admission. Weight down 2 lbs from admission, but up 2 lbs from yesterday. She denies chest pain, palpitations, lightheadedness, and nausea/vomiting.  CURRENT MEDS    . aspirin EC  81 mg Oral Daily  . calcium-vitamin D  1 tablet Oral TID  . carvedilol  12.5 mg Oral BID WC  . cycloSPORINE  1 drop Both Eyes BID  . darifenacin  7.5 mg Oral Daily  . enoxaparin  30 mg Subcutaneous Q24H  . furosemide  40 mg Intravenous Q8H  . omega-3 acid ethyl esters  1 g Oral BID  . potassium chloride  20 mEq Oral BID  . sodium chloride  3 mL Intravenous Q12H  . DISCONTD: carvedilol  25 mg Oral BID WC  . DISCONTD: enoxaparin  40 mg Subcutaneous Q24H  . DISCONTD: furosemide  40 mg Intravenous BID  . DISCONTD: furosemide  40 mg Intravenous BID  . DISCONTD: loteprednol  1 drop Both Eyes BID    OBJECTIVE  Filed Vitals:   08/27/12 0540 08/27/12 1409 08/27/12 2205 08/28/12 0626  BP: 100/40 85/41 115/55 113/56  Pulse: 64 64 71 70  Temp: 97.8 F (36.6 C) 97.1 F (36.2 C) 97.7 F (36.5 C) 98.3 F (36.8 C)  TempSrc: Oral Oral Oral Oral    Resp: 18 20 18 17   Height:      Weight: 120 lb 6.4 oz (54.613 kg)   122 lb 3.2 oz (55.43 kg)  SpO2: 97% 97% 97% 95%    Intake/Output Summary (Last 24 hours) at 08/28/12 0904 Last data filed at 08/28/12 0832  Gross per 24 hour  Intake    604 ml  Output   1800 ml  Net  -1196 ml   Filed Weights   08/26/12 1330 08/27/12 0540 08/28/12 0626  Weight: 124 lb 4.8 oz (56.382 kg) 120 lb 6.4 oz (54.613 kg) 122 lb 3.2 oz (55.43 kg)    PHYSICAL EXAM  General: Pleasant, NAD. Neuro: Alert and oriented X 3. Moves all extremities spontaneously. Psych: Normal affect. HEENT: Kyphosis, otherwise normal.  Neck: Supple without bruits.  jvp to jaw. Lungs:  Resp regular, mildly labored, Bilateral rales halfway up lung fields. Heart: RRR no s3, s4.  3/6 sm heard throughout - loudest @ apex with radiation to axilla. Abdomen: Soft, non-tender, non-distended, BS + x 4.  Extremities: No clubbing, cyanosis or edema. DP/PT/Radials 2+ and equal bilaterally.  Accessory Clinical Findings  CBC  Basename 08/28/12 0535 08/27/12 0129 08/26/12 0957  WBC 6.3 7.3 --  NEUTROABS -- -- 8.1*  HGB 10.1* 10.5* --  HCT 30.7* 31.8* --  MCV 93.3 91.9 --  PLT  173 183 --   Basic Metabolic Panel  Basename 08/28/12 0535 08/27/12 0129  NA 136 136  K 3.3* 3.2*  CL 98 97  CO2 32 29  GLUCOSE 90 98  BUN 24* 21  CREATININE 1.22* 1.34*  CALCIUM 8.9 9.1  MG -- --  PHOS -- --   Cardiac Enzymes  Basename 08/27/12 0130 08/26/12 1943 08/26/12 1412  CKTOTAL -- -- --  CKMB -- -- --  CKMBINDEX -- -- --  TROPONINI <0.30 <0.30 <0.30   BNP (last 3 results)  Basename 08/28/12 0535 08/26/12 1051  PROBNP 4352.0* 6222.0*   Thyroid Function Tests  Basename 08/27/12 0129  TSH 4.579*  T4TOTAL --  T3FREE --  THYROIDAB --   TELE  RSR rate 67  ECG 10/26  RSR, Rate 67. New RBBB  Radiology/Studies  Dg Chest 1 View  08/27/2012  *RADIOLOGY REPORT*  Clinical Data: Shortness of breath.  Congestive heart failure.   CHEST - 1 VIEW  Comparison: PA and lateral chest 02/23/2011 and plain film chest 08/26/2012.  Findings: There is cardiomegaly.  Hazy opacities are present in the chest bilaterally and appear increased.  No pneumothorax. Interstitial pulmonary edema is noted.  IMPRESSION: Cardiomegaly and interstitial pulmonary edema.  Hazy bilateral pulmonary opacities could be due to layering pleural fluid.   Original Report Authenticated By: Bernadene Bell. D'ALESSIO, M.D.    ASSESSMENT AND PLAN  1. Acute on Chronic Diastolic CHF/ Severe MR: Patient still has SOB above baseline level, which is exacerbated with ambulation. Her dyspnea has not improved over the last 24 hours and she cont to have excess volume on exam. BNP 4352, I/O down 706 mL for admission, and daily weight shows 2 lbs improvement overall but up since yesterday. Creat today 1.22, which appears to be below her baseline. Continue Coreg with strict hold parameters to allow for IV Lasix 40mg  TID, with a low threshold to reduce bb dosage if hypotn preventing diuresis. Closely monitor renal function and BP. Strict I/O   2. HL: Continue Fish Oil, Last lipid panel in [facility] 12/2009. LDL (calc) 23.  3. Chronic Anemia: Denies S/s. Hgb 10.1, HCT, 30.7  4. GERD: Cont PPI  5. Hx Hypothyroid: TSH mildly elevated 4.579, draw free T4.  6.  Hypokalemia:  supp.  Signed, Nicolasa Ducking NP  I have personally seen and examined this patient with Ward Givens, NP. I agree with the assessment and plan as outlined above. She has had no real change in breathing. Seems to be diuresing today with increased dose of IV Lasix. Still volume overloaded on exam. Given her advanced age, she is not a candidate for valve surgery but will likely always have issues with CHF if her valve is not replaced. She and her daughter understand this. Will continue to follow with you.   Javarri Segal 1:37 PM 08/28/2012

## 2012-08-28 NOTE — Clinical Documentation Improvement (Signed)
RESPIRATORY FAILURE DOCUMENTATION CLARIFICATION QUERY   THIS DOCUMENT IS NOT A PERMANENT PART OF THE MEDICAL RECORD   Please update your documentation within the medical record to reflect your response to this query.                                                                                     08/28/12  Dr. Debby Bud and/or Associates,  In a better effort to capture your patient's severity of illness, reflect appropriate length of stay and utilization of resources, a review of the patient medical record has revealed the following indicators:  "78 year old, female, with history of congestive heart failure, presents with progressive shortness of breath. Over several days. She is in respiratory distress. Has dyspnea with conversation and has rales" Cheri Guppy, MD  08/26/12 1211 ED Note  Room Air 02 Sat     88% in ED  Respiratory Rate in ED     22-28  "Per patient's son, she has baseline shortness of breath however was able to walk inside the house, in the last few weeks however the shortness of breath has been progressively worsening. In the last 2 days patient has been feeling short of breath even on conversation.  Lungs: Rales to the mid lungs bilaterally Currently stage IV, patient is dyspneic"    RAI,RIPUDEEP M.D.  08/26/2012, 1:11 PM H&P  Treated with Oxygen via nasal cannula  Diuresed with IV Lasix for Acute on Chronic Diastolic Heart Failure      Based on your clinical judgment, please document in the progress notes and discharge summary if a condition below provides greater specificity regarding the patient's respiratory status on admission:   - Acute Respiratory Failure, resolved   - Other Condition   - Unable to Clinically Determine   In responding to this query please exercise your independent judgment.    The fact that a query is asked, does not imply that any particular answer is desired or expected.    Reviewed:  no additional documentation  provided  Thank You,  Jerral Ralph  RN BSN CCDS Certified Clinical Documentation Specialist: Cell   [phone]  Health Information Management [place]   TO RESPOND TO THE THIS QUERY, FOLLOW THE INSTRUCTIONS BELOW:  1. If needed, update documentation for the patient's encounter via the notes activity.  2. Access this query again and click edit on the In [employer].  3. After updating, or not, click F2 to complete all highlighted (required) fields concerning your review. Select "additional documentation in the medical record" OR "no additional documentation provided".  4. Click Sign note button.  5. The deficiency will fall out of your In Basket *Please let us know if you are not able to complete this workflow by phone or e-mail (listed below).

## 2012-08-28 NOTE — Progress Notes (Signed)
  Echocardiogram 2D Echocardiogram has been performed.  [name] [name] 08/28/2012, 5:38 PM 

## 2012-08-28 NOTE — Progress Notes (Signed)
Physical Therapy Treatment Patient Details Name: [name] MRN: [id number] DOB: 1918/03/30 Today's Date: 08/28/2012 Time: 4098-1191 PT Time Calculation (min): 27 min  PT Assessment / Plan / Recommendation Comments on Treatment Session  Pt admitted with CHF and continues to progress with PT. Pt able to increase activity tolerance today with increase in ambulation distance/independence. Motivated!    Follow Up Recommendations  Home health PT;Supervision/Assistance - 24 hour     Does the patient have the potential to tolerate intense rehabilitation     Barriers to Discharge        Equipment Recommendations  3 in 1 bedside comode    Recommendations for Other Services    Frequency Min 3X/week   Plan Discharge plan remains appropriate;Frequency remains appropriate    Precautions / Restrictions Precautions Precautions: Fall Restrictions Weight Bearing Restrictions: No   Pertinent Vitals/Pain None    Mobility  Bed Mobility Bed Mobility: Not assessed Transfers Transfers: Sit to Stand;Stand to Sit (2 trials.) Sit to Stand: 4: Min guard;With upper extremity assist;From chair/3-in-1;From toilet Stand to Sit: 4: Min guard;With upper extremity assist;To toilet;To chair/3-in-1 Details for Transfer Assistance: Guarding for balance with cues for safest hand placement. Ambulation/Gait Ambulation/Gait Assistance: 4: Min guard Ambulation Distance (Feet): 200 Feet (100 feet x 2 trials.) Assistive device: Rolling walker Ambulation/Gait Assistance Details: Guarding for balance with cues to extend trunk tall and stay inside RW. Gait Pattern: Decreased stride length;Trunk flexed;Narrow base of support Stairs: No Wheelchair Mobility Wheelchair Mobility: No    Exercises     PT Diagnosis:    PT Problem List:   PT Treatment Interventions:     PT Goals Acute Rehab PT Goals PT Goal Formulation: With patient Time For Goal Achievement: 09/10/12 Potential to Achieve Goals: Good PT  Goal: Sit to Stand - Progress: Progressing toward goal PT Goal: Stand to Sit - Progress: Progressing toward goal PT Goal: Ambulate - Progress: Progressing toward goal  Visit Information  Last PT Received On: 08/28/12 Assistance Needed: +1    Subjective Data  Subjective: "I would love to do a little bit if I can." Patient Stated Goal: home   Cognition  Overall Cognitive Status: Appears within functional limits for tasks assessed/performed Arousal/Alertness: Awake/alert Orientation Level: Appears intact for tasks assessed Behavior During Session: [facility] for tasks performed    Balance  Balance Balance Assessed: No  End of Session PT - End of Session Equipment Utilized During Treatment: Gait belt;Oxygen (2L O2 via [place].) Activity Tolerance: Patient tolerated treatment well Patient left: in chair;with call bell/phone within reach;with family/visitor present Nurse Communication: Mobility status   GP     Cephus Shelling 08/28/2012, 2:22 PM  08/28/2012 Cephus Shelling, PT, DPT [phone]

## 2012-08-28 NOTE — Progress Notes (Signed)
CARDIAC REHAB PHASE I   PRE:  Rate/Rhythm: 64 SR    BP: sitting 97/57    SaO2: 100 2L, 90 RA  MODE:  Ambulation: 170 ft   POST:  Rate/Rhythm: 70 SR    BP: sitting 107/54     SaO2: 97 2L  Pt tired this am. Able to walk assist x2 with rollator and 2L O2. Needed to sit x1 for rest. SaO2 good on 2L. Some SOB. Getting ready to get lasix. Back to bed after walk. Sts she is not ready to get up today. Requests walking later in the am tomorrow. Uses cane at home for longer distances.  [id number]  Harriet Masson CES, ACSM

## 2012-08-28 NOTE — Progress Notes (Signed)
Subjective: Awake and alert. She is still a little more short of breath than usual. NO chest pain.  Objective: Lab: Lab Results  Component Value Date   WBC 6.3 08/28/2012   HGB 10.1* 08/28/2012   HCT 30.7* 08/28/2012   MCV 93.3 08/28/2012   PLT 173 08/28/2012   BMET    Component Value Date/Time   NA 136 08/28/2012 0535   K 3.3* 08/28/2012 0535   CL 98 08/28/2012 0535   CO2 32 08/28/2012 0535   GLUCOSE 90 08/28/2012 0535   BUN 24* 08/28/2012 0535   CREATININE 1.22* 08/28/2012 0535   CALCIUM 8.9 08/28/2012 0535   GFRNONAA 37* 08/28/2012 0535   GFRAA 43* 08/28/2012 0535     Imaging: CXR 10/27 @ 2100 hr: IMPRESSION:  Cardiomegaly and interstitial pulmonary edema. Hazy bilateral  pulmonary opacities could be due to layering pleural fluid.  Scheduled Meds:   . aspirin EC  81 mg Oral Daily  . calcium-vitamin D  1 tablet Oral TID  . carvedilol  12.5 mg Oral BID WC  . cycloSPORINE  1 drop Both Eyes BID  . darifenacin  7.5 mg Oral Daily  . enoxaparin  30 mg Subcutaneous Q24H  . furosemide  40 mg Intravenous BID  . loteprednol  1 drop Both Eyes BID  . omega-3 acid ethyl esters  1 g Oral BID  . sodium chloride  3 mL Intravenous Q12H  . DISCONTD: carvedilol  25 mg Oral BID WC  . DISCONTD: enoxaparin  40 mg Subcutaneous Q24H  . DISCONTD: furosemide  40 mg Intravenous BID   Continuous Infusions:  PRN Meds:.sodium chloride, acetaminophen, ondansetron (ZOFRAN) IV, sodium chloride   Physical Exam: Filed Vitals:   08/28/12 0626  BP: 113/56  Pulse: 70  Temp: 98.3 F (36.8 C)  Resp: 17    Intake/Output Summary (Last 24 hours) at 08/28/12 0719 Last data filed at 08/28/12 0215  Gross per 24 hour  Intake    364 ml  Output   1525 ml  Net  -1161 ml   Wt 122 from 120 from 124 Gen'l - elderly white woman in no acute distress Cor - IRIR, III/VI systolic murmur best at LSB, no JVD PUlm - rales bilaterally worse on the right - 1/2 way up the lung field. Neuro- A&O x  3     Assessment/Plan: 1. Cardiac - persistent CHF. BNP 6,222 to 4352. Weight down 2 lbs form admission but up 2 lbs from yesterday! Lytes are OK. No chest pain.  Plan Continue present regimen but change furosemide to q 8  2. Thyroid -  TSH 4.6 - normal range  3. HTN  good control  dispo - to return home 10/29 or [address] [place] [facility] (o) [phone]; (c) [phone] Call-grp - [name] Tele: [phone]  08/28/2012, 7:19 AM

## 2012-08-29 DIAGNOSIS — I359 Nonrheumatic aortic valve disorder, unspecified: Secondary | ICD-10-CM

## 2012-08-29 DIAGNOSIS — I080 Rheumatic disorders of both mitral and aortic valves: Secondary | ICD-10-CM

## 2012-08-29 MED ORDER — FUROSEMIDE 40 MG PO TABS
60.0000 mg | ORAL_TABLET | Freq: Two times a day (BID) | ORAL | Status: DC
  Administered 2012-08-29 – 2012-08-30 (×2): 60 mg via ORAL
  Filled 2012-08-29 (×5): qty 1

## 2012-08-29 NOTE — Progress Notes (Signed)
    SUBJECTIVE: Breathing better this am, near baseline. No pain.   BP 103/42  Pulse 62  Temp 98.1 F (36.7 C) (Oral)  Resp 22  Ht 4\' 11"  (1.499 m)  Wt 123 lb 0.3 oz (55.8 kg)  BMI 24.85 kg/m2  SpO2 96%  Intake/Output Summary (Last 24 hours) at 08/29/12 0814 Last data filed at 08/29/12 0548  Gross per 24 hour  Intake    963 ml  Output   2675 ml  Net  -1712 ml    PHYSICAL EXAM General: Well developed, well nourished, in no acute distress. Alert and oriented x 3.  Psych:  Good affect, responds appropriately Neck: No JVD. No masses noted.  Lungs: Dry crackles bases. No wheezes or rhonci noted.  Heart: RRR with systolic  murmur noted. Abdomen: Bowel sounds are present. Soft, non-tender.  Extremities: No lower extremity edema.   LABS: Basic Metabolic Panel:  Basename 08/28/12 0535 08/27/12 0129  NA 136 136  K 3.3* 3.2*  CL 98 97  CO2 32 29  GLUCOSE 90 98  BUN 24* 21  CREATININE 1.22* 1.34*  CALCIUM 8.9 9.1  MG -- --  PHOS -- --   CBC:  Basename 08/28/12 0535 08/27/12 0129 08/26/12 0957  WBC 6.3 7.3 --  NEUTROABS -- -- 8.1*  HGB 10.1* 10.5* --  HCT 30.7* 31.8* --  MCV 93.3 91.9 --  PLT 173 183 --   Cardiac Enzymes:  Basename 08/27/12 0130 08/26/12 1943 08/26/12 1412  CKTOTAL -- -- --  CKMB -- -- --  CKMBINDEX -- -- --  TROPONINI <0.30 <0.30 <0.30    Current Meds:    . aspirin EC  81 mg Oral Daily  . calcium-vitamin D  1 tablet Oral TID  . carvedilol  12.5 mg Oral BID WC  . cycloSPORINE  1 drop Both Eyes BID  . darifenacin  7.5 mg Oral Daily  . enoxaparin  30 mg Subcutaneous Q24H  . furosemide  40 mg Intravenous Q8H  . omega-3 acid ethyl esters  1 g Oral BID  . potassium chloride  20 mEq Oral BID  . potassium chloride  40 mEq Oral Once  . sodium chloride  3 mL Intravenous Q12H     ASSESSMENT AND PLAN:  1. Acute on Chronic Diastolic CHF: Breathing better after diuresis 2.6 liters. BMET pending this am. Will continue IV Lasix for am dose then  change to po Lasix as I feel her volume is near baseline. Will increase po Lasix to 60 mg po BID (home dose is 40 mg po BID)  2. Severe MR: Not an operative candidate given advanced age. Likely contributes to her episodes of CHF.    [name]  10/29/20138:14 AM

## 2012-08-29 NOTE — Plan of Care (Signed)
Problem: Phase I Progression Outcomes Goal: Dyspnea controlled at rest (HF) Outcome: Progressing Pt walked with PT, had no c/o SOB, pt did well, wears 2L O2 PRN, goal progressing, will continue to monitor   Problem: Phase II Progression Outcomes Goal: Other Phase II Outcomes/Goals Outcome: Completed/Met Date Met:  08/29/12 Pt given heart failure booklet, education given to pt and family, both pt and family verbalized understanding, goal met, will monitor   Problem: Phase III Progression Outcomes Goal: Pain controlled on oral analgesia Outcome: Completed/Met Date Met:  08/29/12 Pt has not had any c/o pain, goal met, will continue to monitor

## 2012-08-29 NOTE — Progress Notes (Signed)
Physical Therapy Treatment Patient Details Name: [name] MRN: [id number] DOB: [birth year] Today's Date: 08/29/2012 Time: 0932-1007 PT Time Calculation (min): 35 min  PT Assessment / Plan / Recommendation Comments on Treatment Session  Pt. presented in her room with her daughter and her son present. Daughter helped updat SPTA of patients current status. At rest, pt. O2 sat were at 90%. 2L of supplemental O2 were used  for the first 11ft of ambulation. Pt. stated that she would try 3-4 steps and gait back to the room without supplemental O2. Pt. able to ambulate with RA and O2 sats stayed low-mid 90s. Pt. able to do 4 steps min (A), going up using the Rt rail and coming down using the Lt rail. Pt. educated in PLB when she felt SOB while up and moving.     Follow Up Recommendations  Home health PT;Supervision/Assistance - 24 hour     Does the patient have the potential to tolerate intense rehabilitation     Barriers to Discharge        Equipment Recommendations  3 in 1 bedside comode    Recommendations for Other Services OT consult  Frequency Min 3X/week   Plan Discharge plan remains appropriate;Frequency remains appropriate    Precautions / Restrictions Precautions Precautions: Fall Restrictions Weight Bearing Restrictions: No   Pertinent Vitals/Pain Resting O2 sats at 90%, pt. began ambulation on 2L supplemental O2. O2 sats remained in the mid 90's and pt able to do stairs and return to room on RA.    Mobility  Bed Mobility Bed Mobility: Supine to Sit;Sitting - Scoot to Edge of Bed Supine to Sit: 5: Supervision Sitting - Scoot to [place] of Bed: 5: Supervision Details for Bed Mobility Assistance: Pt. presented to need cues for handplacement to scoot to the EOB Transfers Transfers: Sit to Stand;Stand to Sit Sit to Stand: 4: Min guard Stand to Sit: 4: Min guard Details for Transfer Assistance: Pt. was guarded for safety and to (A) with proper hand placement on RW upon  standing and to reach back when sitting. Ambulation/Gait Ambulation/Gait Assistance: 4: Min guard Ambulation Distance (Feet): 200 Feet ((161ftx2)) Assistive device: Rolling walker Ambulation/Gait Assistance Details: Pt. guarded for times of feeling "woozy", per pt. She asked to sit in recliner once before approaching the stairs and was ready to sit when returning back to her room. Pt. states that "woozy" feeling comes and goes periodically when she is up and moving. Used 2L O2 for the first ~126ft since pt's resting O2 was at 90% in her room. Before approaching the stairs, pt. stated that she thought she would be able to attempt 3-4 steps and walking back to room without supplemental O2.  Gait Pattern: Step-through pattern;Decreased stride length;Trunk flexed;Narrow base of support General Gait Details: Pt. presented to need cues for standing erect and to look up from the floor when walking.  Stairs: Yes Stairs Assistance: 4: Min assist Stair Management Technique: One rail Right;One rail Left;Step to pattern;Forwards (Up on the Rt, Down on the Lt.) Number of Stairs: 4  Wheelchair Mobility Wheelchair Mobility: No         PT Goals Acute Rehab PT Goals PT Goal Formulation: With patient Time For Goal Achievement: 09/10/12 Potential to Achieve Goals: Good Pt will go Supine/Side to Sit: with modified independence PT Goal: Supine/Side to Sit - Progress: Progressing toward goal Pt will go Sit to Supine/Side: with modified independence Pt will go Sit to Stand: with modified independence PT Goal: Sit to Stand -  Progress: Progressing toward goal Pt will go Stand to Sit: with modified independence PT Goal: Stand to Sit - Progress: Progressing toward goal Pt will Ambulate: 51 - 150 feet;with supervision;with least restrictive assistive device;with rolling walker PT Goal: Ambulate - Progress: Progressing toward goal Pt will Go Up / Down Stairs: 3-5 stairs;with min assist;with rail(s) PT Goal:  Up/Down Stairs - Progress: Progressing toward goal  Visit Information  Last PT Received On: 08/29/12 Assistance Needed: +1    Subjective Data  Subjective: "Oh I think I can get up today" Patient Stated Goal: I'm ready to go home   Cognition  Overall Cognitive Status: Appears within functional limits for tasks assessed/performed Arousal/Alertness: Awake/alert Orientation Level: Appears intact for tasks assessed Behavior During Session: [facility] for tasks performed    Balance  Balance Balance Assessed: No  End of Session PT - End of Session Equipment Utilized During Treatment: Gait belt;Oxygen (2L O2 for first 144ft of ambulation) Activity Tolerance: Patient tolerated treatment well Patient left: in chair;with call bell/phone within reach;with family/visitor present Nurse Communication: Mobility status     Mertie Clause , SPTA 08/29/2012, 12:09 PM  Verdell Face, PTA [phone] 08/29/2012

## 2012-08-29 NOTE — Progress Notes (Signed)
Subjective: [name] is awake. She does feel better and reports that she is breathing easier.  Objective: Lab: Lab Results  Component Value Date   WBC 6.3 08/28/2012   HGB 10.1* 08/28/2012   HCT 30.7* 08/28/2012   MCV 93.3 08/28/2012   PLT 173 08/28/2012   BMET    Component Value Date/Time   NA 136 08/28/2012 0535   K 3.3* 08/28/2012 0535   CL 98 08/28/2012 0535   CO2 32 08/28/2012 0535   GLUCOSE 90 08/28/2012 0535   BUN 24* 08/28/2012 0535   CREATININE 1.22* 08/28/2012 0535   CALCIUM 8.9 08/28/2012 0535   GFRNONAA 37* 08/28/2012 0535   GFRAA 43* 08/28/2012 0535   2 D echo - Study Conclusions  - Left ventricle: The cavity size was normal. Wall thickness was increased in a pattern of mild LVH. Systolic function was normal. The estimated ejection fraction was in the range of 55% to 60%. - Aortic valve: Mild regurgitation. - Mitral valve: Severe regurgitation. - Left atrium: PFO/small ASD appears to be present by color doppler. The atrium was moderately to severely dilated.    Imaging:  Scheduled Meds:   . aspirin EC  81 mg Oral Daily  . calcium-vitamin D  1 tablet Oral TID  . carvedilol  12.5 mg Oral BID WC  . cycloSPORINE  1 drop Both Eyes BID  . darifenacin  7.5 mg Oral Daily  . enoxaparin  30 mg Subcutaneous Q24H  . furosemide  40 mg Intravenous Q8H  . omega-3 acid ethyl esters  1 g Oral BID  . potassium chloride  20 mEq Oral BID  . potassium chloride  40 mEq Oral Once  . sodium chloride  3 mL Intravenous Q12H  . DISCONTD: furosemide  40 mg Intravenous BID  . DISCONTD: loteprednol  1 drop Both Eyes BID   Continuous Infusions:  PRN Meds:.sodium chloride, acetaminophen, ondansetron (ZOFRAN) IV, sodium chloride   Physical Exam: Filed Vitals:   08/29/12 0542  BP: 103/42  Pulse: 62  Temp: 98.1 F (36.7 C)  Resp: 22    Intake/Output Summary (Last 24 hours) at 08/29/12 0719 Last data filed at 08/29/12 0548  Gross per 24 hour  Intake    963 ml    Output   2950 ml  Net  -1987 ml   Total this adm -2.6 L  Wt is 123 up from 122 up from 120  Gen'l- very elderly white woman in no distress HEENT- C&S clear Cor- RRR PUlm - feint bibasilar rales, improved from yesterday.   Assessment/Plan: 1. Cardiac - CHF with good response to diuretics - out a total of 2.6 liters and she is breathing better. 2 D echo with surprisingly good EF.   Plan Continue q 8 IV lasix.  furhter suggestions per cardiology  2. Thyroid - stable TSH in normal range ( up to 5.50)  3. HTN - good control  fenfluramine/phentermine ('fen/phen') - K was slightly low - on oral replacement.  Plan Bmet in AM  Possible d/c home in AM  [employer] [facility] (o) [phone]; (c) [phone] Call-grp - [name] Tele: [phone]  08/29/2012, 7:17 AM

## 2012-08-29 NOTE — Plan of Care (Signed)
Problem: Phase I Progression Outcomes Goal: EF % per last Echo/documented,Core Reminder form on chart Outcome: Completed/Met Date Met:  08/29/12 Echo completed [id number] showed EF 55-60%.

## 2012-08-30 ENCOUNTER — Telehealth: Payer: Self-pay

## 2012-08-30 DIAGNOSIS — D649 Anemia, unspecified: Secondary | ICD-10-CM

## 2012-08-30 LAB — BASIC METABOLIC PANEL
Calcium: 9.3 mg/dL (ref 8.4–10.5)
GFR calc Af Amer: 48 mL/min — ABNORMAL LOW (ref >90)
GFR calc non Af Amer: 42 mL/min — ABNORMAL LOW (ref >90)
Sodium: 135 mEq/L (ref 135–145)

## 2012-08-30 MED ORDER — POTASSIUM CHLORIDE 40 MEQ/15ML (20%) PO LIQD: 30.0000 meq | Freq: Every day | ORAL | Status: DC

## 2012-08-30 MED ORDER — FUROSEMIDE 40 MG PO TABS: 60.0000 mg | ORAL_TABLET | Freq: Two times a day (BID) | ORAL | Status: DC

## 2012-08-30 NOTE — Discharge Summary (Signed)
[name], OLENICK NO.:  [id number]  MEDICAL RECORD NO.:  [id number]  LOCATION:  4712                         FACILITY:  [facility]  PHYSICIAN:  Rosalyn Gess. Norins, MD  DATE OF BIRTH:  1917-12-13  DATE OF ADMISSION:  08/26/2012 DATE OF DISCHARGE:  08/30/2012                              DISCHARGE SUMMARY   ADMITTING DIAGNOSES: 1. Exacerbation of congestive heart failure with shortness of breath. 2. Advanced mitral valve disease. 3. Anemia. 4. Hyperlipidemia. 5. Gastroesophageal reflux disease. 6. Osteoarthritis. 7. Hypothyroid disease.  DISCHARGE DIAGNOSES: 1. Exacerbation of congestive heart failure with shortness of breath. 2. Advanced mitral valve disease. 3. Anemia. 4. Hyperlipidemia. 5. Gastroesophageal reflux disease. 6. Osteoarthritis. 7. Hypothyroid disease.  CONSULTANTS:  Verne Carrow, MD, for Cardiology.  PROCEDURES: 1. Chest x-ray, portable on day of admission which showed cardiomegaly     with new mild interstitial opacity suspicious for interstitial     edema. 2. Followup chest x-ray October 27 which showed cardiomegaly and     interstitial pulmonary edema, hazy bilateral pulmonary opacities     that may be due to overlaying pleural fluid. 3. A 2D echo performed August 28, 2012, which revealed an ejection     fraction of 55-60%, mild wall thickness increased consistent with     mild left ventricular hypertrophy.  Systolic function was normal.     Aortic valve with mild regurgitation, mitral valve with severe     regurgitation, left atrium with a small PFO.  Atrium was moderately-     to-severely dilated.  Pulmonary arteries with a PA peak pressure 70     mmHg.  HISTORY OF THE PRESENT ILLNESS:  [name] is a 78 year old woman with severe mitral regurgitation.  She is not a surgical candidate.  She has a history of chronic diastolic heart failure with recurrent episodes of shortness of breath.  She has been carefully managed  as an outpatient with use of diuretics.  The patient had been in her usual state of health, but had progressive shortness of breath and presented to the emergency department with this complaint.  Primary historian was the patient and her son and daughter.  He reported that over the last few weeks her shortness of breath has been progressively getting worse and in the 2 days prior to admission her shortness of breath was severe so that interfered with conversation.  She had no fevers, sweats, chills, nausea, vomiting, abdominal pain.  She had a decreased appetite.  She had not previously been on oxygen at home.  The patient does adhere to a low-salt diet.  Does take her medications as scheduled.  Because of exacerbation of her CHF, the patient was admitted for IV diuretics.  Please see the H and P for past medical history, family history, social history.  PHYSICAL EXAMINATION:  VITAL SIGNS:  Admission examination significant for blood pressure of 152/128 per record, oxygen saturation was 99%, respiratory rate was 22. LUNGS:  Rales to mid lung fields bilaterally.  Exam was otherwise unremarkable.  LABORATORY FINDINGS:  Admitting laboratory revealed a hemoglobin of 11.2 g, white count was 10,200, platelet count was 195,000.  Chemistries with a creatinine of 1.36, BUN of  23, sodium of 133, potassium of 4.  HOSPITAL COURSE: 1. Congestive heart failure.  The patient was diuresed using IV Lasix.     It was eventually advanced to 40 mg IV t.i.d.  On this regimen, the     patient had a diuresis of 3.4 L.  She did have a repeat 2D echo     during this admission as noted above.  Cardiology felt that there     was no need to change her medications other than to increase her     maintenance of Lasix dosing.  The patient otherwise remained     cardiac stable.  With the patient having diuresed successfully with     her shortness of breath being resolved and her feeling that she is     at her  baseline, she is now ready for discharge home.  Of note, the     patient has been on oxygen while in hospital.  There is not a room     air oxygen level available during this admission.  Concern is     whether the patient would be a candidate for home O2.  Plan, the     patient will be seen by [facility] and will have     overnight pulse oximetry and daytime oximetry to see she would be a     candidate and qualify for home oxygen. 2. Thyroid.  The patient has been stable.  Her TSH has been less than     5.5.  Free T3 was mentioned in the Cardiology note, but no lab is     available other than her TSH.  Plan, the patient will continue on     her home regimen. 3. Hypertension.  The patient's blood pressure has been well     controlled.  She did have a slightly low potassium and this was     replaced orally.  Last metabolic panel was October 28 with a     potassium of 3.3.  Laboratory was ordered for the day of discharge     and is pending at the time of dictation.  We will follow up and     instruct the patient as to any need for further replacement     therapy.   With the patient's shortness of breath being resolved after     adequate diuresis, with her other medical problems being stable, the     patient is now ready for discharge to home.  We will arrange for     the patient to have home health for pulse oximetry as noted as well     as nursing visit.  We will refer the patient back to Dr. Clifton James     to be seen either by Dr. Clifton James or his PA within 7 days of     discharge.  The patient's condition at the time of discharge dictation is stable, but guarded given her advanced age, severe mitral regurgitation, and recurrent heart failure.     Rosalyn Gess Norins, MD     MEN/MEDQ  D:  08/30/2012  T:  08/30/2012  Job:  [id number]  cc:   Verne Carrow, MD

## 2012-08-30 NOTE — Care Management Note (Unsigned)
    Page 1 of 1   08/30/2012     10:03:13 AM   CARE MANAGEMENT NOTE 08/30/2012  Patient:  [name], [name]   Account Number:  [id number]  Date Initiated:  08/30/2012  Documentation initiated by:  CRAFT,TERRI  Subjective/Objective Assessment:   78 yo female admitted with CHF     Action/Plan:   D/C when medically stable.   Anticipated DC Date:  09/02/2012   Anticipated DC Plan:  HOME W HOME HEALTH SERVICES      DC Planning Services  CM consult      [facility] Choice  HOME HEALTH   Choice offered to / List presented to:  C-4 Adult Children        HH arranged  HH-1 RN  HH-2 PT      [facility] agency  [facility]   Status of service:  Completed, signed off    Discharge Disposition:  HOME W HOME HEALTH SERVICES  Per UR Regulation:  Reviewed for med. necessity/level of care/duration of stay  Comments:  08/30/12, Kathi Der RNC-MNN, BSN, [phone], CM received referral for [facility] services and met with pt and adult children, son and daughter, to offer choice for [facility] services.  Pt's daughter chose [facility].  Marie at [facility] contacted with order and confirmation received.  Pt lives with her daughter who will be able to assist as needed.

## 2012-08-30 NOTE — Progress Notes (Signed)
Pt 02 has been titrated from 2L/02 via [place] to RA.  Pt sats in the mid to upper 90's at rest.  Pt complains of intermittent SOB.  Pt ambulated in hallway on RA and 02 sats remain in the mid to upper 90's.  MD has been made aware.  Will continue to monitor. Nino Glow RN

## 2012-08-30 NOTE — Progress Notes (Signed)
IV d/c'd.  Tele d/c'd.  Pt d/c'd to home.  Home meds and d/c instructions have been discussed with pt.  Pt and daughter deny any questions or concerns at this time.  Pt leaving unit via wheelchair and appears in no acute distress. Nino Glow RN

## 2012-08-30 NOTE — Progress Notes (Signed)
Patient has had an adequate diuresis of 3.4 liters. She is ready for d/c home. Will need to follow up lab that is pending. Will need appointment with Dr. Sanjuana Kava in 1 week.  Dictated [phone]  Greater than thirty minutes spent in discharge and arranging follow-up.

## 2012-08-30 NOTE — Progress Notes (Signed)
    SUBJECTIVE: Feeling better. SOB improved. No pain. No events.   BP 106/53  Pulse 77  Temp 97.9 F (36.6 C) (Oral)  Resp 18  Ht 4\' 11"  (1.499 m)  Wt 119 lb 3.2 oz (54.069 kg)  BMI 24.08 kg/m2  SpO2 97%  Intake/Output Summary (Last 24 hours) at 08/30/12 0711 Last data filed at 08/30/12 0518  Gross per 24 hour  Intake    480 ml  Output   1250 ml  Net   -770 ml    PHYSICAL EXAM General: Well developed, well nourished, in no acute distress. Alert and oriented x 3.  Psych:  Good affect, responds appropriately Neck: No JVD. No masses noted.  Lungs: Clear bilaterally with no wheezes or rhonci noted.  Heart: RRR with loud systolic murmur noted. Abdomen: Bowel sounds are present. Soft, non-tender.  Extremities: No lower extremity edema.   LABS: CBC:  Basename 08/28/12 0535  WBC 6.3  NEUTROABS --  HGB 10.1*  HCT 30.7*  MCV 93.3  PLT 173    Current Meds:    . aspirin EC  81 mg Oral Daily  . calcium-vitamin D  1 tablet Oral TID  . carvedilol  12.5 mg Oral BID WC  . cycloSPORINE  1 drop Both Eyes BID  . darifenacin  7.5 mg Oral Daily  . enoxaparin  30 mg Subcutaneous Q24H  . furosemide  60 mg Oral BID  . omega-3 acid ethyl esters  1 g Oral BID  . potassium chloride  20 mEq Oral BID  . sodium chloride  3 mL Intravenous Q12H  . DISCONTD: furosemide  40 mg Intravenous Q8H     ASSESSMENT AND PLAN:  1. Acute on Chronic Diastolic CHF: Breathing better after diuresis 3.4 liters. Now on po Lasix. Volume much better. Ready for d/c home with increased dose of Lasix at 60 mg po BID.   2. Severe MR: Not an operative candidate given advanced age. Likely contributes to her episodes of CHF.   Ferdinand Revoir  10/30/20137:11 AM

## 2012-08-30 NOTE — Telephone Encounter (Signed)
Pt's daughter called requesting clarification on pt's potassium. Discharge instructions states to take BID but medication picked up from pharmacy says QD, please advise.

## 2012-08-31 MED ORDER — POTASSIUM CHLORIDE 40 MEQ/15ML (20%) PO LIQD: 30.0000 meq | Freq: Two times a day (BID) | ORAL | Status: DC

## 2012-08-31 NOTE — Telephone Encounter (Signed)
Pt's daughter informed, Rx updated and refaxed.

## 2012-08-31 NOTE — Telephone Encounter (Signed)
Liquid potassium 11.25 cc bid

## 2012-09-05 ENCOUNTER — Telehealth: Payer: Self-pay

## 2012-09-05 NOTE — Telephone Encounter (Signed)
HHPT advised via secure VM 

## 2012-09-05 NOTE — Telephone Encounter (Signed)
ok 

## 2012-09-05 NOTE — Telephone Encounter (Signed)
HHPT called requesting verbal authorization for a speech therapist to work with pt on memory and cognition. Please advise.

## 2012-09-08 ENCOUNTER — Ambulatory Visit (INDEPENDENT_AMBULATORY_CARE_PROVIDER_SITE_OTHER): Payer: Medicare Other | Admitting: Cardiovascular Disease

## 2012-09-08 ENCOUNTER — Encounter: Payer: Self-pay | Admitting: Cardiovascular Disease

## 2012-09-08 VITALS — BP 107/55 | HR 72 | Wt 118.0 lb

## 2012-09-08 DIAGNOSIS — I509 Heart failure, unspecified: Secondary | ICD-10-CM

## 2012-09-08 DIAGNOSIS — I5032 Chronic diastolic (congestive) heart failure: Secondary | ICD-10-CM

## 2012-09-08 DIAGNOSIS — I059 Rheumatic mitral valve disease, unspecified: Secondary | ICD-10-CM

## 2012-09-08 DIAGNOSIS — I340 Nonrheumatic mitral (valve) insufficiency: Secondary | ICD-10-CM

## 2012-09-08 LAB — BASIC METABOLIC PANEL
Calcium: 9 mg/dL (ref 8.4–10.5)
GFR: 29.04 mL/min — ABNORMAL LOW (ref >60.00)
Glucose, Bld: 82 mg/dL (ref 70–99)
Potassium: 4.3 mEq/L (ref 3.5–5.1)
Sodium: 134 mEq/L — ABNORMAL LOW (ref 135–145)

## 2012-09-08 NOTE — Progress Notes (Signed)
History of Present Illness: [name] is a pleasant 78 year old Caucasian female with a past medical history significant for hyperlipidemia, GERD, and severe mitral valvular regurgitation as well as diastolic dysfunction here for cardiac follow up. She was admitted to [facility] and treated for E. coli urosepsis as well as acute on chronic heart failure in January 2010 and was discharged on Lasix therapy. She has gotten progressively more dyspneic over the last three years. She has not been felt to be a candidate for MVR given her advanced age. This has been discussed at length over the last few years with the patient, her daughter and Dr. Debby Bud. She was admitted to [facility] 08/26/12 with worsened dyspnea, volume overload. She diuresed with IV Lasix and had symptomatic improvement.    She is here today for follow up.  She is still dyspneic but at baseline. No chest pain, near syncope or syncope.    Primary Care Physician: Norins  Last Lipid Profile:Lipid Panel     Component Value Date/Time   CHOL 115 02/17/2010 1210   TRIG 155.0* 02/17/2010 1210   HDL 61.10 02/17/2010 1210   CHOLHDL 2 02/17/2010 1210   VLDL 31.0 02/17/2010 1210   LDLCALC 23 02/17/2010 1210     Past Medical History  Diagnosis Date  . Coronary artery disease   . Peripheral neuropathy   . Aortic insufficiency     mild  . Cerumen impaction   . Unspecified diastolic heart failure   . Hyperlipemia   . Mitral valve prolapse   . Mitral regurgitation     severe  . Urosepsis   . Anemia, unspecified   . Osteoporosis   . Adenocarcinoma, breast   . Irritable bladder   . Urinary incontinence   . Hypothyroidism   . GERD (gastroesophageal reflux disease)   . Osteoarthritis   . Hx of appendectomy   . H/O: whooping cough   . History of intraocular lens implant     bilateral  . Hx  of bilateral cataract extraction   . History of left mastectomy   . History of breast biopsy   . Closed fracture of  patella     repair of right knee  . Hx of tonsillectomy     Past Surgical History  Procedure Date  . Appendectomy   . Cataract extraction w/ intraocular lens  implant, bilateral   . Mastectomy     left breast  . Breast biopsy     Current Outpatient Prescriptions  Medication Sig Dispense Refill  . aspirin 81 MG tablet Take 81 mg by mouth daily.        . calcium-vitamin D (OSCAL WITH D) 500-200 MG-UNIT per tablet Take 1 tablet by mouth 3 (three) times daily.        . carvedilol (COREG) 25 MG tablet Take 25 mg by mouth 2 (two) times daily with a meal.      . cycloSPORINE (RESTASIS) 0.05 % ophthalmic emulsion Place 1 drop into both eyes 2 (two) times daily.       . fish oil-omega-3 fatty acids 1000 MG capsule Take 1 g by mouth 2 (two) times daily.       . furosemide (LASIX) 40 MG tablet Take 1.5 tablets (60 mg total) by mouth 2 (two) times daily.  90 tablet  11  . guaiFENesin (MUCINEX) 600 MG 12 hr tablet Take 1,200 mg by mouth as needed. For congestion      . Multiple Vitamins-Minerals (ICAPS) CAPS  Take 1 capsule by mouth 4 (four) times daily.      . NON FORMULARY Estrace cream  Add very little to outer layer of vaginal area      . potassium chloride 40 MEQ/15ML (20%) LIQD Take 11.25 mLs (30 mEq total) by mouth 2 (two) times daily.  675 mL  11  . solifenacin (VESICARE) 5 MG tablet Take 5 mg by mouth daily.      [name] sulfamethoxazole-trimethoprim (BACTRIM,SEPTRA) 400-80 MG per tablet Take 1 tablet by mouth 2 (two) times daily.        No Known Allergies  History   Social History  . Marital Status: Married    Spouse Name: N/A    Number of Children: N/A  . Years of Education: N/A   Occupational History  . Not on file.   Social History Main Topics  . Smoking status: Never Smoker   . Smokeless tobacco: Never Used  . Alcohol Use: No  . Drug Use: Not on file  . Sexually Active: No   Other Topics Concern  . Not on file   Social History Narrative   HSGMarried- '39-'102  daughters- 1 died at birth, '9; 1 brother Mar 17, 2043, 2 grandchildren and 2 great grandsLives with daughterEnd of life care:- has a living will- DNR/DNI, no heroic measuresTobacco Use-noAlcohol Use- noDrug use-no    Family History  Problem Relation Age of Onset  . Heart disease Mother   . Cancer Mother     breast cancer  . Stroke Father   . Heart disease Father   . Heart disease Sister   . Alzheimer's disease Sister   . Heart disease Sister   . Heart disease Brother     Review of Systems:  As stated in the HPI and otherwise negative.   BP 107/55  Pulse 72  Wt 118 lb (53.524 kg)  Physical Examination: General: Well developed, well nourished, NAD HEENT: OP clear, mucus membranes moist SKIN: warm, dry. No rashes. Neuro: No focal deficits Musculoskeletal: Muscle strength 5/5 all ext Psychiatric: Mood and affect normal Neck: No JVD, no carotid bruits, no thyromegaly, no lymphadenopathy. Lungs:Dry crackles bilaterally, unchanged. No wheezes, rhonci.  Cardiovascular: Regular rate and rhythm. Loud systolic murmur noted. No gallops or rubs. Abdomen:Soft. Bowel sounds present. Non-tender.  Extremities: No lower extremity edema. Pulses are 2 + in the bilateral DP/PT.  Echo: 08/28/12:  Left ventricle: The cavity size was normal. Wall thickness was increased in a pattern of mild LVH. Systolic function was normal. The estimated ejection fraction was in the range of 55% to 60%. - Aortic valve: Mild regurgitation. - Mitral valve: Severe regurgitation. - Left atrium: PFO/small ASD appears to be present by color doppler. The atrium was moderately to severely dilated. - Pulmonary arteries: PA peak pressure: 70mm Hg (S).  Assessment and Plan:   1. MITRAL REGURGITATION, SEVERE: She has severe MR with elevated PA pressures, severely dilated LA and chronic diastolic CHF. She is not a surgical candidate for MVR given her age. I tried afterload reduction with Lisinopril but she did not tolerate. She  has not wanted to start other afterload reducing agents.  Her dyspnea is stable is stable today. Her weight is stable per excellent home log. Lungs with baseline dry crackles. Will continue Lasix 60 mg po BID. Will check BMET today. ? If she could be a candidate for other mitral valve procedures such as mitral clip. I have not put her through a TEE given her advanced age and frail state.  Will refer to CHF clinic to Dr. Gala Romney to discuss options for medical therapy, possible novel approaches to her valve disease, discuss pulmonary HTN. She is one of my favorite patients and any slight benefit that could be obtained from change in therapy would be great for her.   2. DIASTOLIC HEART FAILURE, CHRONIC: Weight stable. BP well controlled. No changes.

## 2012-09-08 NOTE — Patient Instructions (Addendum)
You have been referred to  Dr. Gala Romney at the [facility].

## 2012-09-11 ENCOUNTER — Other Ambulatory Visit: Payer: Self-pay | Admitting: *Deleted

## 2012-09-11 DIAGNOSIS — I5032 Chronic diastolic (congestive) heart failure: Secondary | ICD-10-CM

## 2012-09-14 ENCOUNTER — Other Ambulatory Visit (INDEPENDENT_AMBULATORY_CARE_PROVIDER_SITE_OTHER): Payer: Medicare Other

## 2012-09-14 DIAGNOSIS — I5032 Chronic diastolic (congestive) heart failure: Secondary | ICD-10-CM

## 2012-09-14 LAB — BASIC METABOLIC PANEL
CO2: 28 mEq/L (ref 19–32)
Chloride: 98 mEq/L (ref 96–112)
Potassium: 4.5 mEq/L (ref 3.5–5.1)
Sodium: 134 mEq/L — ABNORMAL LOW (ref 135–145)

## 2012-09-18 ENCOUNTER — Encounter (HOSPITAL_COMMUNITY): Payer: Self-pay

## 2012-09-18 ENCOUNTER — Ambulatory Visit (HOSPITAL_COMMUNITY)
Admission: RE | Admit: 2012-09-18 | Discharge: 2012-09-18 | Disposition: A | Discharge status: Home / Self Care | Payer: Medicare Other | Source: Ambulatory Visit | Attending: Internal Medicine | Admitting: Internal Medicine

## 2012-09-18 VITALS — BP 110/56 | HR 71 | Wt 123.4 lb

## 2012-09-18 DIAGNOSIS — I059 Rheumatic mitral valve disease, unspecified: Secondary | ICD-10-CM | POA: Insufficient documentation

## 2012-09-18 DIAGNOSIS — I080 Rheumatic disorders of both mitral and aortic valves: Secondary | ICD-10-CM

## 2012-09-18 DIAGNOSIS — I5032 Chronic diastolic (congestive) heart failure: Secondary | ICD-10-CM | POA: Insufficient documentation

## 2012-09-18 MED ORDER — FUROSEMIDE 40 MG PO TABS: ORAL_TABLET | ORAL | Status: DC

## 2012-09-18 NOTE — Patient Instructions (Addendum)
Increase lasix 60 mg (1.5 tabs) in morning and 40 mg (1 tab) in the afternoon.  Labs next week with [facility].  Follow 3-4 weeks with Dr. Gala Romney.

## 2012-09-18 NOTE — Assessment & Plan Note (Signed)
See discussion under MR section.

## 2012-09-18 NOTE — Progress Notes (Addendum)
Primary Cardiologist: Dr. Clifton James PCP: Dr. Debby Bud   HPI:  [name] is a 78 y.o. female with history of diastolic heart failure, severe mitral regurgitation, HL and hypothyroidism.  She is followed by Dr. Clifton James and was recently discharged for acute exacerbation of heart failure.  She diuresed 3.1 L and was discharged 10/30 on lasix 60 mg BID (up from 40 mg BID).  Her Cr increased 1.7 at follow up and lasix decreased 40 mg BID and Cr improved 1.4.    Echo 08/28/12: LVEF 55-60%.  Mild AI, Severe MR.  LA Mod to severely dilated.  PFO/small ASD.  PAPP 70 mmHg.    She has been referred by Dr. Clifton James for valvular disease.  Here with daughter [name]) and son.  She lives with her daughter.  Son is up from [place].  She feels pretty good today.  She has noted increased shortness of breath over last 2-6 months.  She  was able to walk around the store but now only walking around house.  Uses a cane.  Her weight has been stable at home, 118-119 pounds.  She denies edema.  No chest pain.  No orthopnea currently.    ROS: All systems negative except as listed in HPI, PMH and Problem List.  Past Medical History  Diagnosis Date  . Coronary artery disease   . Peripheral neuropathy   . Aortic insufficiency     mild  . Cerumen impaction   . Unspecified diastolic heart failure   . Hyperlipemia   . Mitral valve prolapse   . Mitral regurgitation     severe  . Urosepsis   . Anemia, unspecified   . Osteoporosis   . Adenocarcinoma, breast   . Irritable bladder   . Urinary incontinence   . Hypothyroidism   . GERD (gastroesophageal reflux disease)   . Osteoarthritis   . Hx of appendectomy   . H/O: whooping cough   . History of intraocular lens implant     bilateral  . Hx  of bilateral cataract extraction   . History of left mastectomy   . History of breast biopsy   . Closed fracture of patella     repair of right knee  . Hx of tonsillectomy     Current Outpatient Prescriptions    Medication Sig Dispense Refill  . aspirin 81 MG tablet Take 81 mg by mouth daily.        . calcium-vitamin D (OSCAL WITH D) 500-200 MG-UNIT per tablet Take 1 tablet by mouth 3 (three) times daily.        . carvedilol (COREG) 25 MG tablet Take 25 mg by mouth 2 (two) times daily with a meal.      . cycloSPORINE (RESTASIS) 0.05 % ophthalmic emulsion Place 1 drop into both eyes 2 (two) times daily.       . fish oil-omega-3 fatty acids 1000 MG capsule Take 1 g by mouth 2 (two) times daily.       . furosemide (LASIX) 40 MG tablet 1.5 tab in the morning and 1 tab in the afternoon  30 tablet    . guaiFENesin (MUCINEX) 600 MG 12 hr tablet Take 1,200 mg by mouth as needed. For congestion      . Multiple Vitamins-Minerals (ICAPS) CAPS Take 1 capsule by mouth 4 (four) times daily.      . NON FORMULARY as needed. Estrace cream  Add very little to outer layer of vaginal area      . potassium  chloride 40 MEQ/15ML (20%) LIQD Take 11.25 mLs (30 mEq total) by mouth daily.      [name] sulfamethoxazole-trimethoprim (BACTRIM,SEPTRA) 400-80 MG per tablet Take 1 tablet by mouth daily.       . [DISCONTINUED] furosemide (LASIX) 40 MG tablet Take 1 tablet (40 mg total) by mouth 2 (two) times daily.         PHYSICAL EXAM: Filed Vitals:   09/18/12 1417  BP: 110/56  Pulse: 71  Weight: 123 lb 6.4 oz (55.974 kg)  SpO2: 94%    General:  Well appearing. No resp difficulty HEENT: normal Neck: supple. JVP 9-10. Carotids 2+ bilaterally; no bruits. No lymphadenopathy or thryomegaly appreciated. Cor: s/p L mastectomy. PMI normal. Regular rate & rhythm. 3/6 MR murmur. No rubs, gallops. Lungs: clear Abdomen: soft, nontender, nondistended. No hepatosplenomegaly. No bruits or masses. Good bowel sounds. Extremities: no cyanosis, clubbing, rash, trace edema Neuro: alert & orientedx3, cranial nerves grossly intact. Moves all 4 extremities w/o difficulty. Affect pleasant.    ASSESSMENT & PLAN:

## 2012-09-18 NOTE — Addendum Note (Signed)
Encounter addended by: Dolores Patty, MD on: 09/18/2012 11:40 PM<BR>     Documentation filed: Notes Section

## 2012-09-18 NOTE — Assessment & Plan Note (Addendum)
Very difficult situation. I have reviewed [name]'s echo personally and also reviewed Dr. Gibson Ramp notes, notes from her recent hospitalization and her home weight and BP logs in detail. I had a long discussion with her as well as her family about the situation. She has severe central MR with a dilated MV annulus and class IV HF despite very close attention. I told them her problem was really surgical in nature but she would not be a candidate for this. We also discussed the possibility of a MV clip but I felt this too was also too high risk. Thus we are left with only medical therapy and I was candid with her in saying that this would likely produce suboptimal results for her. We have chosen to increase her lasix to 60/40 and to tolerate some permissive azotemia if it reduced her dyspnea any. We also talked about the need for palliative care/Hospice involvement to help treat symptoms at home.  We have arranged to have [facility] come to the patient's house to discuss goals of care.  We will see her back in 2-3 weeks to check back in on her. Will also arrange for overnight oximetry testing to see if she needs nighttime O2 to help with her sx.   Total time spent > 60 minutes with over 75% of this time spent discussing her MV regurgitation and the treatment options.

## 2012-09-20 NOTE — Addendum Note (Signed)
Encounter addended by: Noralee Space, RN on: 09/20/2012  3:57 PM<BR>     Documentation filed: Orders

## 2012-09-22 ENCOUNTER — Encounter (HOSPITAL_COMMUNITY): Payer: Self-pay | Admitting: General Practice

## 2012-09-22 ENCOUNTER — Inpatient Hospital Stay (HOSPITAL_COMMUNITY)
Admission: AD | Admit: 2012-09-22 | Discharge: 2012-09-25 | DRG: 293 | Disposition: A | Discharge status: Home / Self Care | Payer: Medicare Other | Source: Ambulatory Visit | Attending: Internal Medicine | Admitting: Internal Medicine

## 2012-09-22 ENCOUNTER — Ambulatory Visit: Payer: Medicare Other | Admitting: Internal Medicine

## 2012-09-22 ENCOUNTER — Ambulatory Visit (INDEPENDENT_AMBULATORY_CARE_PROVIDER_SITE_OTHER)
Admission: RE | Admit: 2012-09-22 | Discharge: 2012-09-22 | Disposition: A | Discharge status: Home / Self Care | Payer: Medicare Other | Source: Ambulatory Visit | Attending: Internal Medicine | Admitting: Internal Medicine

## 2012-09-22 ENCOUNTER — Telehealth: Payer: Self-pay | Admitting: *Deleted

## 2012-09-22 DIAGNOSIS — M810 Age-related osteoporosis without current pathological fracture: Secondary | ICD-10-CM | POA: Diagnosis present

## 2012-09-22 DIAGNOSIS — R0602 Shortness of breath: Secondary | ICD-10-CM

## 2012-09-22 DIAGNOSIS — I059 Rheumatic mitral valve disease, unspecified: Secondary | ICD-10-CM

## 2012-09-22 DIAGNOSIS — M1990 Unspecified osteoarthritis, unspecified site: Secondary | ICD-10-CM | POA: Diagnosis present

## 2012-09-22 DIAGNOSIS — I10 Essential (primary) hypertension: Secondary | ICD-10-CM | POA: Diagnosis present

## 2012-09-22 DIAGNOSIS — E785 Hyperlipidemia, unspecified: Secondary | ICD-10-CM | POA: Diagnosis present

## 2012-09-22 DIAGNOSIS — I509 Heart failure, unspecified: Secondary | ICD-10-CM

## 2012-09-22 DIAGNOSIS — K219 Gastro-esophageal reflux disease without esophagitis: Secondary | ICD-10-CM | POA: Diagnosis present

## 2012-09-22 DIAGNOSIS — Z79899 Other long term (current) drug therapy: Secondary | ICD-10-CM

## 2012-09-22 DIAGNOSIS — Z66 Do not resuscitate: Secondary | ICD-10-CM | POA: Diagnosis present

## 2012-09-22 DIAGNOSIS — R0601 Orthopnea: Secondary | ICD-10-CM | POA: Diagnosis present

## 2012-09-22 DIAGNOSIS — I2510 Atherosclerotic heart disease of native coronary artery without angina pectoris: Secondary | ICD-10-CM | POA: Diagnosis present

## 2012-09-22 DIAGNOSIS — G609 Hereditary and idiopathic neuropathy, unspecified: Secondary | ICD-10-CM | POA: Diagnosis present

## 2012-09-22 DIAGNOSIS — Z7982 Long term (current) use of aspirin: Secondary | ICD-10-CM

## 2012-09-22 DIAGNOSIS — Z853 Personal history of malignant neoplasm of breast: Secondary | ICD-10-CM

## 2012-09-22 DIAGNOSIS — E039 Hypothyroidism, unspecified: Secondary | ICD-10-CM | POA: Diagnosis present

## 2012-09-22 DIAGNOSIS — R269 Unspecified abnormalities of gait and mobility: Secondary | ICD-10-CM

## 2012-09-22 DIAGNOSIS — I5033 Acute on chronic diastolic (congestive) heart failure: Principal | ICD-10-CM | POA: Diagnosis present

## 2012-09-22 HISTORY — DX: Unspecified hemorrhoids: K64.9

## 2012-09-22 HISTORY — DX: Shortness of breath: R06.02

## 2012-09-22 HISTORY — DX: Pure hypercholesterolemia, unspecified: E78.00

## 2012-09-22 HISTORY — DX: Heart failure, unspecified: I50.9

## 2012-09-22 HISTORY — DX: Cardiac murmur, unspecified: R01.1

## 2012-09-22 LAB — CBC
HCT: 35.5 % — ABNORMAL LOW (ref 36.0–46.0)
Hemoglobin: 11.7 g/dL — ABNORMAL LOW (ref 12.0–15.0)
MCH: 30.2 pg (ref 26.0–34.0)
MCHC: 33 g/dL (ref 30.0–36.0)
MCV: 91.7 fL (ref 78.0–100.0)
Platelets: 221 10*3/uL (ref 150–400)
RBC: 3.87 MIL/uL (ref 3.87–5.11)
RDW: 14.8 % (ref 11.5–15.5)
WBC: 9.3 10*3/uL (ref 4.0–10.5)

## 2012-09-22 LAB — BASIC METABOLIC PANEL
BUN: 19 mg/dL (ref 6–23)
CO2: 25 mEq/L (ref 19–32)
Chloride: 94 mEq/L — ABNORMAL LOW (ref 96–112)
GFR calc Af Amer: 40 mL/min — ABNORMAL LOW (ref >90)
Potassium: 4.6 mEq/L (ref 3.5–5.1)

## 2012-09-22 LAB — PRO B NATRIURETIC PEPTIDE: Pro B Natriuretic peptide (BNP): 7253 pg/mL — ABNORMAL HIGH (ref 0–450)

## 2012-09-22 MED ORDER — CARVEDILOL 25 MG PO TABS
25.0000 mg | ORAL_TABLET | Freq: Two times a day (BID) | ORAL | Status: DC
  Administered 2012-09-22 – 2012-09-25 (×6): 25 mg via ORAL
  Filled 2012-09-22 (×8): qty 1

## 2012-09-22 MED ORDER — FUROSEMIDE 10 MG/ML IJ SOLN
40.0000 mg | Freq: Four times a day (QID) | INTRAMUSCULAR | Status: DC
  Administered 2012-09-22 – 2012-09-25 (×12): 40 mg via INTRAVENOUS
  Filled 2012-09-22 (×16): qty 4

## 2012-09-22 MED ORDER — ASPIRIN 81 MG PO CHEW
81.0000 mg | CHEWABLE_TABLET | Freq: Every day | ORAL | Status: DC
  Administered 2012-09-22: 81 mg via ORAL
  Filled 2012-09-22: qty 1

## 2012-09-22 MED ORDER — CYCLOSPORINE 0.05 % OP EMUL
1.0000 [drp] | Freq: Two times a day (BID) | OPHTHALMIC | Status: DC
  Administered 2012-09-22 – 2012-09-25 (×6): 1 [drp] via OPHTHALMIC
  Filled 2012-09-22 (×8): qty 1

## 2012-09-22 MED ORDER — ACETAMINOPHEN 650 MG RE SUPP: 650.0000 mg | Freq: Four times a day (QID) | RECTAL | Status: DC | PRN

## 2012-09-22 MED ORDER — ACETAMINOPHEN 325 MG PO TABS: 650.0000 mg | ORAL_TABLET | Freq: Four times a day (QID) | ORAL | Status: DC | PRN

## 2012-09-22 MED ORDER — POTASSIUM CHLORIDE 40 MEQ/15ML (20%) PO LIQD
30.0000 meq | Freq: Every day | ORAL | Status: DC
  Filled 2012-09-22 (×9): qty 11.25

## 2012-09-22 MED ORDER — ASPIRIN 81 MG PO TABS
81.0000 mg | ORAL_TABLET | Freq: Every day | ORAL | Status: DC
Start: 2012-09-22 — End: 2012-09-22

## 2012-09-22 MED ORDER — ASPIRIN EC 81 MG PO TBEC
81.0000 mg | DELAYED_RELEASE_TABLET | Freq: Every day | ORAL | Status: DC
  Administered 2012-09-23 – 2012-09-25 (×3): 81 mg via ORAL
  Filled 2012-09-22 (×4): qty 1

## 2012-09-22 MED ORDER — SODIUM CHLORIDE 0.9 % IJ SOLN
3.0000 mL | Freq: Two times a day (BID) | INTRAMUSCULAR | Status: DC
  Administered 2012-09-22 – 2012-09-25 (×6): 3 mL via INTRAVENOUS

## 2012-09-22 MED ORDER — NITROGLYCERIN 0.4 MG SL SUBL
SUBLINGUAL_TABLET | SUBLINGUAL | Status: AC
  Filled 2012-09-22: qty 25

## 2012-09-22 MED ORDER — SULFAMETHOXAZOLE-TRIMETHOPRIM 400-80 MG PO TABS
1.0000 | ORAL_TABLET | Freq: Every day | ORAL | Status: DC
  Administered 2012-09-22 – 2012-09-25 (×4): 1 via ORAL
  Filled 2012-09-22 (×4): qty 1

## 2012-09-22 MED ORDER — ASPIRIN 81 MG PO TABS: 81.0000 mg | ORAL_TABLET | Freq: Every day | ORAL | Status: DC

## 2012-09-22 MED ORDER — ENOXAPARIN SODIUM 30 MG/0.3ML ~~LOC~~ SOLN
30.0000 mg | SUBCUTANEOUS | Status: DC
  Administered 2012-09-22 – 2012-09-24 (×3): 30 mg via SUBCUTANEOUS
  Filled 2012-09-22 (×4): qty 0.3

## 2012-09-22 MED ORDER — POTASSIUM CHLORIDE 20 MEQ/15ML (10%) PO LIQD
40.0000 meq | Freq: Every day | ORAL | Status: DC
  Administered 2012-09-22 – 2012-09-25 (×4): 40 meq via ORAL
  Filled 2012-09-22 (×4): qty 30

## 2012-09-22 MED ORDER — GUAIFENESIN ER 600 MG PO TB12
1200.0000 mg | ORAL_TABLET | Freq: Two times a day (BID) | ORAL | Status: DC
  Administered 2012-09-22 – 2012-09-25 (×6): 1200 mg via ORAL
  Filled 2012-09-22 (×7): qty 2

## 2012-09-22 NOTE — Progress Notes (Signed)
Pt complained of chest pain while in admitting rated 7/10.  Upon arrival to floor pt stated pain was 5/10.  Pt denied any lightheadedness or dizziness or any radiation of pain.  Pt placed in bed.  Placed on 2L/02 via [place].  Vitals taken.  BP 111/68, pulse 65, R 16, 96% on 2L/02.  Stat EKG performed.  MD has been made aware.  Pt reassessed and offered sublingual nitro.  Pt refused at the time since chest pain had resolved.  Pt currently chest pain free.  Will continue to monitor. Nino Glow RN

## 2012-09-22 NOTE — H&P (Signed)
[name] is an 78 y.o. female.   Chief Complaint: [name] presents with acute exacerbation of SOB HPI: [name] is followed for inoperable MVR and chronic diastolic heart failure with recent hospitalization for SOB due to fluid overload. On November 18th she saw Dr. Gala Romney (note reviewed) for a full discussion of her MVR and the decision to treat medically. He notes that it is acceptable to have permissive azotemia as a trade off for adequate fluid management for comfort. Hospice was also discussed and a referral was made. She has been doing well at home but over the last 24-48 hrs she has had marked increase in shortness of breath along with PND. She denies any chest pain or discomfort, no productive cough, no wheezing.   Past Medical History  Diagnosis Date  . Coronary artery disease   . Peripheral neuropathy   . Aortic insufficiency     mild  . Cerumen impaction   . Unspecified diastolic heart failure   . Hyperlipemia   . Mitral valve prolapse   . Mitral regurgitation     severe  . Urosepsis   . Anemia, unspecified   . Osteoporosis   . Adenocarcinoma, breast   . Irritable bladder   . Urinary incontinence   . Hypothyroidism   . GERD (gastroesophageal reflux disease)   . Osteoarthritis   . Hx of appendectomy   . H/O: whooping cough   . History of intraocular lens implant     bilateral  . Hx  of bilateral cataract extraction   . History of left mastectomy   . History of breast biopsy   . Closed fracture of patella     repair of right knee  . Hx of tonsillectomy     Past Surgical History  Procedure Date  . Appendectomy   . Cataract extraction w/ intraocular lens  implant, bilateral   . Mastectomy     left breast  . Breast biopsy     Family History  Problem Relation Age of Onset  . Heart disease Mother   . Cancer Mother     breast cancer  . Stroke Father   . Heart disease Father   . Heart disease Sister   . Alzheimer's disease Sister   .  Heart disease Sister   . Heart disease Brother    Social History:  HSGMarried- '39-'102 daughters- 1 died at birth, '93; 1 brother Apr 07, 2043, 2 grandchildren and 2 great grands. Lives with daughter. End of life care:- has a living will- DNR/DNI, no heroic measures. Tobacco Use-no. Alcohol Use- no. Drug use-no    Allergies: No Known Allergies No current facility-administered medications on file prior to encounter.   Current Outpatient Prescriptions on File Prior to Encounter  Medication Sig Dispense Refill  . aspirin 81 MG tablet Take 81 mg by mouth daily.        . calcium-vitamin D (OSCAL WITH D) 500-200 MG-UNIT per tablet Take 1 tablet by mouth 3 (three) times daily.        . carvedilol (COREG) 25 MG tablet Take 25 mg by mouth 2 (two) times daily with a meal.      . cycloSPORINE (RESTASIS) 0.05 % ophthalmic emulsion Place 1 drop into both eyes 2 (two) times daily.       . fish oil-omega-3 fatty acids 1000 MG capsule Take 1 g by mouth 2 (two) times daily.       . furosemide (LASIX) 40 MG tablet 1.5 tab in the morning  and 1 tab in the afternoon  30 tablet    . guaiFENesin (MUCINEX) 600 MG 12 hr tablet Take 1,200 mg by mouth as needed. For congestion      . Multiple Vitamins-Minerals (ICAPS) CAPS Take 1 capsule by mouth 4 (four) times daily.      . NON FORMULARY as needed. Estrace cream  Add very little to outer layer of vaginal area      . potassium chloride 40 MEQ/15ML (20%) LIQD Take 11.25 mLs (30 mEq total) by mouth daily.      [name] sulfamethoxazole-trimethoprim (BACTRIM,SEPTRA) 400-80 MG per tablet Take 1 tablet by mouth daily.           No results found for this or any previous visit (from the past 48 hour(s)). Dg Chest 2 View  09/22/2012  *RADIOLOGY REPORT*  Clinical Data: Shortness of breath.  Congestive heart failure.  CHEST - 2 VIEW  Comparison: Single view of the chest 08/27/2012 and PA and lateral chest 08/25/2011.  Findings: There is marked cardiomegaly without edema.  Chronic scar  and volume loss in the right chest is unchanged.  Hiatal hernia is noted.  Mild, remote compression fractures in the lower thoracic spine and at the thoracolumbar junction are unchanged.  IMPRESSION:  1.  No acute disease. 2.  Cardiomegaly.   Original Report Authenticated By: Holley Dexter, M.D.     Review of Systems  Constitutional: Negative for fever, chills and weight loss.  HENT: Negative.   Eyes: Negative.   Respiratory: Positive for shortness of breath. Negative for cough, hemoptysis, sputum production and wheezing.   Cardiovascular: Positive for orthopnea, leg swelling and PND. Negative for chest pain and palpitations.  Gastrointestinal: Negative.   Genitourinary: Negative.   Musculoskeletal: Negative.   Skin: Negative.   Neurological: Positive for weakness.  Endo/Heme/Allergies: Negative.   Psychiatric/Behavioral: Negative.      Physical Exam  No vitals done at office Gen'l- very elderly white woman who is short of breath HEENT- [place]/AT, C&SA clear, PERRLA Neck - supple Cor - 2+ radial pulse that is regular on exam. No JVD PUlm - decreased breath sounds right chest with bibasilar rales, no increased WOB, no use of accessory muscles Abd - soft Ext - trace LE edema Neuro - HOH, awake and alert, CN II- XII normal except hearing. Assessment/Plan 1. Cardiac - patient with acute decompensation CHF with marked SOB and PND. No chest pain. Recent note from CHF service reviewed.  Plan Tele observation  IV furosemide 40 mg q 6  Daily weights  Lab: cycle troponins, Bmet, BNP  Will assess RA oxygen   Continue other home medications  2. Code Status - based on previous discussion and the record, along with Dr. Prescott Gum discussion, patient is a limited Code.  Anticipate short stay for acute fluid management  Illene Regulus 09/22/2012, 3:08 PM  On-call: [phone]

## 2012-09-22 NOTE — Telephone Encounter (Signed)
Her chest x-ray is normal.

## 2012-09-22 NOTE — Telephone Encounter (Signed)
Tresa Endo, home health nurse from [facility] called stating the pt is short of breath and is more significant since last week. Family states worse in the last 2 days. Nurse states the crackling and rales is louder than last week. No fever and pt has difficulty breathing while talking and activity. O2 sat is 96%, but she feels that she needs a chest x-ray. Spoke with Dr. Yetta Barre and he ok'd chest x-ray. Please read x-rays before pt leaves the building.

## 2012-09-23 DIAGNOSIS — I509 Heart failure, unspecified: Secondary | ICD-10-CM

## 2012-09-23 LAB — BASIC METABOLIC PANEL
CO2: 27 mEq/L (ref 19–32)
Chloride: 100 mEq/L (ref 96–112)
Glucose, Bld: 99 mg/dL (ref 70–99)
Potassium: 3.7 mEq/L (ref 3.5–5.1)
Sodium: 137 mEq/L (ref 135–145)

## 2012-09-23 LAB — TROPONIN I: Troponin I: <0.3 ng/mL (ref <0.30)

## 2012-09-23 NOTE — Progress Notes (Signed)
Subjective: [name] admitted with acute on chronic diastolic failure with increased SOB. She does feel a little better. NO chest pain.  Objective: Lab: Lab Results  Component Value Date   WBC 9.3 09/22/2012   HGB 11.7* 09/22/2012   HCT 35.5* 09/22/2012   MCV 91.7 09/22/2012   PLT 221 09/22/2012   BMET    Component Value Date/Time   NA 137 09/23/2012 0530   K 3.7 09/23/2012 0530   CL 100 09/23/2012 0530   CO2 27 09/23/2012 0530   GLUCOSE 99 09/23/2012 0530   BUN 20 09/23/2012 0530   CREATININE 1.38* 09/23/2012 0530   CALCIUM 9.0 09/23/2012 0530   GFRNONAA 32* 09/23/2012 0530   GFRAA 37* 09/23/2012 0530   Cardiac Panel (last 3 results)  Basename 09/23/12 0530 09/22/12 1959 09/22/12 1454  CKTOTAL -- -- --  CKMB -- -- --  TROPONINI <0.30 <0.30 <0.30  RELINDX -- -- --  pBNP 7253    Imaging: CXR IMPRESSION:  1. No acute disease.  2. Cardiomegaly.  Scheduled Meds:   . aspirin  81 mg Oral Daily  . aspirin EC  81 mg Oral Daily  . carvedilol  25 mg Oral BID WC  . cycloSPORINE  1 drop Both Eyes BID  . enoxaparin (LOVENOX) injection  30 mg Subcutaneous Q24H  . furosemide  40 mg Intravenous Q6H  . guaiFENesin  1,200 mg Oral BID  . potassium chloride  40 mEq Oral Daily  . sodium chloride  3 mL Intravenous Q12H  . sulfamethoxazole-trimethoprim  1 tablet Oral Daily  . [DISCONTINUED] aspirin  81 mg Oral Daily  . [DISCONTINUED] aspirin  81 mg Oral Daily  . [DISCONTINUED] potassium chloride  30 mEq Oral Daily   Continuous Infusions:  PRN Meds:.acetaminophen, acetaminophen   Physical Exam: Filed Vitals:   09/23/12 0550  BP: 104/42  Pulse: 65  Temp: 98.1 F (36.7 C)    Intake/Output Summary (Last 24 hours) at 09/23/12 0904 Last data filed at 09/23/12 0849  Gross per 24 hour  Intake    618 ml  Output   1500 ml  Net   -882 ml   WT   119 down to 118 Gen'l - bright, elderly woman in no distress HEENT C&S clear, PERRLA Neck - no JVD Cor- RRR  III/VI  systolic murmur PUlm - dcreased BS right base with mild rales, feint rales on the left Neuro - bright and alert with positive affect.    Assessment/Plan: 1. CArdiac - acute on chronic diastolic heart failure. Diuresing well. Feels better  Plan Continue IV lasix  AM lab: Bmet, pBNP   [name] [facility] (o) [phone]; (c) [phone] Call-grp - [name] [facility]  Tele: [phone]  09/23/2012, 8:55 AM

## 2012-09-23 NOTE — Progress Notes (Signed)
Nutrition Brief Note  Patient identified on the Malnutrition Screening Tool (MST) Report  Pt/family denies any recent weight changes. Pt reports good appetite.   BMI: 21.6 which is WNL.   Current diet order is Regular, patient is consuming approximately 100% of meals at this time. Labs and medications reviewed.   No nutrition interventions warranted at this time. If nutrition issues arise, please consult RD.   Kendell Bane RD, LDN, CNSC [phone] Weekend/After Hours Pager

## 2012-09-23 NOTE — Progress Notes (Deleted)
Patient at [phone] had a 5 beat run of v-tach. Patient is resting in bed and is asymptomatic.

## 2012-09-23 NOTE — Progress Notes (Signed)
UR COMPLETED.   Tayanna Talford Wise Natthew Marlatt, RN, BSN    Phone #[phone] 

## 2012-09-23 NOTE — Progress Notes (Signed)
1610 MD was called and notified that patient had a 9 beat run of v-tach this am. Patient was resting in bed and the time and was asymptomatic. VS were taken and documented.

## 2012-09-24 LAB — BASIC METABOLIC PANEL
BUN: 19 mg/dL (ref 6–23)
CO2: 30 mEq/L (ref 19–32)
Chloride: 98 mEq/L (ref 96–112)
GFR calc non Af Amer: 31 mL/min — ABNORMAL LOW (ref >90)
Glucose, Bld: 84 mg/dL (ref 70–99)
Potassium: 3.1 mEq/L — ABNORMAL LOW (ref 3.5–5.1)
Sodium: 137 mEq/L (ref 135–145)

## 2012-09-24 NOTE — Progress Notes (Signed)
Subjective: [name] is feeling better. She has been up to the bathroom and has sat up for meals. She does not complain of SOB at rest.  Objective: Lab: Lab Results  Component Value Date   WBC 9.3 09/22/2012   HGB 11.7* 09/22/2012   HCT 35.5* 09/22/2012   MCV 91.7 09/22/2012   PLT 221 09/22/2012   BMET    Component Value Date/Time   NA 137 09/24/2012 0700   K 3.1* 09/24/2012 0700   CL 98 09/24/2012 0700   CO2 30 09/24/2012 0700   GLUCOSE 84 09/24/2012 0700   BUN 19 09/24/2012 0700   CREATININE 1.44* 09/24/2012 0700   CALCIUM 8.6 09/24/2012 0700   GFRNONAA 31* 09/24/2012 0700   GFRAA 35* 09/24/2012 0700   pBNP : 7,253 to 4141  Imaging: no new imaging  Scheduled Meds:   . aspirin EC  81 mg Oral Daily  . carvedilol  25 mg Oral BID WC  . cycloSPORINE  1 drop Both Eyes BID  . enoxaparin (LOVENOX) injection  30 mg Subcutaneous Q24H  . furosemide  40 mg Intravenous Q6H  . guaiFENesin  1,200 mg Oral BID  . potassium chloride  40 mEq Oral Daily  . sodium chloride  3 mL Intravenous Q12H  . sulfamethoxazole-trimethoprim  1 tablet Oral Daily   Continuous Infusions:  PRN Meds:.acetaminophen, acetaminophen   Physical Exam: Filed Vitals:   09/24/12 0500  BP: 102/47  Pulse: 77  Temp: 98 F (36.7 C)  Resp: 20   Very pleasant woman in no distress Cor- RRR III/VI systolic murmur PUlm - feint rales at right base; no increased WOB Abd- soft  Neuro - A&O x 3     Assessment/Plan: 1. Cardiac - acute on chronic diastolic failure  - improved, BNP down, diuresed 1.7 liters. Per cocumentation RA O2 sat 945  Plan Continue IV lasix for another 24 hrs   Patient and family want home O2 - they know that it will not be covered and will pay out-of-pocket   Dispo - if all goes well, home in AM   [employer] IM (o) [phone]; (c) [phone] Call-grp - [name] IM  Tele: [phone]  09/24/2012, 9:23 AM

## 2012-09-25 DIAGNOSIS — I059 Rheumatic mitral valve disease, unspecified: Secondary | ICD-10-CM

## 2012-09-25 LAB — BASIC METABOLIC PANEL
BUN: 18 mg/dL (ref 6–23)
CO2: 32 mEq/L (ref 19–32)
Chloride: 98 mEq/L (ref 96–112)
GFR calc non Af Amer: 32 mL/min — ABNORMAL LOW (ref >90)
Glucose, Bld: 84 mg/dL (ref 70–99)
Potassium: 3.6 mEq/L (ref 3.5–5.1)
Sodium: 137 mEq/L (ref 135–145)

## 2012-09-25 NOTE — Discharge Summary (Signed)
NAMEVENBA, ZENNER NO.:  [id number]  MEDICAL RECORD NO.:  [id number]  LOCATION:  4704                         FACILITY:  [facility]  PHYSICIAN:  Rosalyn Gess. Norins, MD  DATE OF BIRTH:  [birth year]  DATE OF ADMISSION:  09/22/2012 DATE OF DISCHARGE:  09/25/2012                              DISCHARGE SUMMARY   ADMITTING DIAGNOSES: 1. Exacerbation acute on chronic diastolic heart failure. 2. Mitral regurgitation. 3. Hypertension.  DISCHARGE DIAGNOSES: 1. Exacerbation acute on chronic diastolic heart failure. 2. Mitral regurgitation. 3. Hypertension.  CONSULTANTS:  None.  PROCEDURES:  Chest x-ray performed in the office on September 22, 2012 which showed no acute disease.  Cardiomegaly is noted.  HISTORY OF PRESENT ILLNESS:  Ms. Bassinger is a 78 year old woman followed for wide-open mitral regurgitation and she is not an operable candidate.  She has recurrent episodes of acute on chronic diastolic heart failure due in part to her severe mitral regurgitation.  The patient was in her usual state of health when she developed progressive shortness of breath over 48 hours prior to admission.  On the day of admission, she felt significantly short of breath with increased work of breathing.  She was seen to be in acute on chronic heart failure and was subsequently admitted to hospital.  Please see the H and P for past medical history, family history, social history, and admission exam.  HOSPITAL COURSE:  The patient was admitted to a telemetry unit.  She was administered IV Lasix at 60 mg t.i.d.  On this regimen, the patient did diurese total of 2.5 L with a drop in weight of approximately 2-1/2 pounds.  On this regimen, she felt much better.  Of note, she did remain on oxygen during her hospital stay.  She had 1 room air O2 sat measured at 94%.  Of note, the patient and her family would like to have oxygen at home.  An overnight pulse oximetry study was done prior to  admission. Results were pending.  Hypertension.  The patient's blood pressure ran a little bit low during her hospital stay but she tolerated this well.  We will not change her medications at this time.  With the patient having diuresed successfully with her respiratory status improved, at this point she is ready for discharge to home.  No changes in her medications will be made.  She does know to follow the "rule of 2s,"meaning for any 2 pound gain in weight over 2 days, she would double her Lasix for 2 days.  In addition, the patient will see Dr. Gala Romney in followup in the congestive Heart failure Clinic as previously scheduled.  DISCHARGE EXAMINATION:  VITAL SIGNS:  Temperature was 97.4, blood pressure 111/47, heart rate 68, respirations 18, O2 sats 96% on room air. GENERAL APPEARANCE:  This is a well-nourished elderly woman, in no acute distress. HEENT:  Conjunctiva and sclerae were clear.  Pupils equal, round, reactive to light and accommodation. NECK:  Supple without thyromegaly. NODES:  No adenopathy is noted in the cervical or supraclavicular regions. CHEST:  No CVA tenderness. LUNGS:  The patient has rales at the right base with chronic effusion known.  She has no increased work  of breathing.  No wheezing is appreciated. ABDOMEN:  Soft.  No guarding or rebound is noted. NEURO:  The patient is awake, alert.  She is oriented to person, place, time and context.  FINAL LABORATORY:  Metabolic panel from the day of discharge with a sodium of 137, potassium 3.6, chloride of 98, CO2 of 32, BUN of 18, creatinine 1.37.  The patient had negative troponin at time of admission.  The patient had a CBC on the day of admission with a white count of 9300, hemoglobin of 11.7 g, platelet count 221,000.  DISPOSITION:  The patient is discharged home.  She has close observation and care by her family.  We will review overnight oximetry.  The patient will receive home oxygen.  It is just  a question whether it will be covered by Medicare or private pay.  FOLLOWUP:  The patient will keep her appointment with Dr. Gala Romney. She will see Internal Medicine on an as-needed basis.  PROGNOSIS:  Guarded given her advanced age and severe mitral regurgitation and heart failure.     Rosalyn Gess Norins, MD     MEN/MEDQ  D:  09/25/2012  T:  09/25/2012  Job:  [id number]  cc:   Bevelyn Buckles. Bensimhon, MD

## 2012-09-25 NOTE — Plan of Care (Signed)
Problem: Phase I Progression Outcomes Goal: EF % per last Echo/documented,Core Reminder form on chart Outcome: Completed/Met Date Met:  09/25/12 08/28/12 EF 55-60 %

## 2012-09-25 NOTE — Progress Notes (Signed)
Subjective: Doing better and ready for d/c/ home  Objective: Lab: Lab Results  Component Value Date   WBC 9.3 09/22/2012   HGB 11.7* 09/22/2012   HCT 35.5* 09/22/2012   MCV 91.7 09/22/2012   PLT 221 09/22/2012   BMET    Component Value Date/Time   NA 137 09/25/2012 0500   K 3.6 09/25/2012 0500   CL 98 09/25/2012 0500   CO2 32 09/25/2012 0500   GLUCOSE 84 09/25/2012 0500   BUN 18 09/25/2012 0500   CREATININE 1.37* 09/25/2012 0500   CALCIUM 9.0 09/25/2012 0500   GFRNONAA 32* 09/25/2012 0500   GFRAA 38* 09/25/2012 0500     Imaging:  Scheduled Meds:   . aspirin EC  81 mg Oral Daily  . carvedilol  25 mg Oral BID WC  . cycloSPORINE  1 drop Both Eyes BID  . enoxaparin (LOVENOX) injection  30 mg Subcutaneous Q24H  . furosemide  40 mg Intravenous Q6H  . guaiFENesin  1,200 mg Oral BID  . potassium chloride  40 mEq Oral Daily  . sodium chloride  3 mL Intravenous Q12H  . sulfamethoxazole-trimethoprim  1 tablet Oral Daily   Continuous Infusions:  PRN Meds:.acetaminophen, acetaminophen   Physical Exam: Filed Vitals:   09/25/12 0455  BP: 111/47  Pulse: 68  Temp: 97.4 F (36.3 C)  Resp: 18        Assessment/Plan: For d/c/ home - doing better.   Dictated  #[id number]   Illene Regulus [place] IM (o) [phone]; (c) [phone] Call-grp - [name] IM  Tele: [phone]  09/25/2012, 7:29 AM

## 2012-09-25 NOTE — Progress Notes (Signed)
SATURATION QUALIFICATIONS:  Patient Saturations on Room Air at Rest = 94%  Patient Saturations on [employer] while Ambulating = 87%  Patient Saturations on2 Liters of oxygen while Ambulating = 90%  Statement of medical necessity for home oxygen:Pt going home on 2 L n/c due to shortness of breath on exertion and CHF diagnosis

## 2012-09-25 NOTE — Progress Notes (Signed)
Went over all discharge info with pt and family. Daughter will take pt home after home 02 instruction from [facility] rep. Marisa Cyphers RN

## 2012-09-25 NOTE — Progress Notes (Signed)
Completed f/f form for resumption of HH RN and home O2

## 2012-09-25 NOTE — Progress Notes (Signed)
Pt discharged per her own w/c. Family has all belongings aware of home meds and follow up appts. Will have HHRN and Home 02. Pt is heart failure pt. Daughter aware of daily weights and 2 Gm Na diet restrictions.

## 2012-09-25 NOTE — Care Management Note (Signed)
    Page 1 of 2   09/25/2012     3:48:38 PM   CARE MANAGEMENT NOTE 09/25/2012  Patient:  [name], [name]   Account Number:  [id number]  Date Initiated:  09/25/2012  Documentation initiated by:  Donn Pierini  Subjective/Objective Assessment:   Pt admitted with with CHF     Action/Plan:   PTA pt lived at home with family and [facility] following for [facility] needs   Anticipated DC Date:  09/25/2012   Anticipated DC Plan:  HOME W HOME HEALTH SERVICES      DC Planning Services  CM consult      [facility] Choice  Resumption Of Svcs/PTA Provider   Choice offered to / List presented to:  NA   DME arranged  OXYGEN      DME agency  [facility]     [facility] arranged  HH-1 RN  HH-10 DISEASE MANAGEMENT  HH-7 RESPIRATORY THERAPY      HH agency  [facility]   Status of service:  Completed, signed off Medicare Important Message given?   (If response is "NO", the following Medicare IM given date fields will be blank) Date Medicare IM given:   Date Additional Medicare IM given:    Discharge Disposition:  HOME W HOME HEALTH SERVICES  Per UR Regulation:  Reviewed for med. necessity/level of care/duration of stay  If discussed at Long Length of Stay Meetings, dates discussed:    Comments:  09/25/12- 1400- Donn Pierini RN, BSN [phone] Pt for d/c today, orders for [facility] and home O2- pt was active with [facility] PTA-spoke with Hilda Lias with [facility] and confirmed and notified of resumption of care orders. Pt also will need home O2- order for home oxygen in chart. [facility] aware of DME need. Pt to d/c home with family once O2 arrives to room.

## 2012-09-25 NOTE — Progress Notes (Signed)
Pt on RA 02 sat 94 % . Amb in hall approx  25 to 30 feet 02 sat on RA at 87 % noted dyspneic upon exertion. 02 at 1 L  n/c sat at 89 % on 2 L n/c 02 sat at 90 % . 5  min after returning to chair at rest 02 sat 94 % on 2 L n/c

## 2012-09-26 ENCOUNTER — Telehealth (HOSPITAL_COMMUNITY): Payer: Self-pay | Admitting: Cardiology

## 2012-09-26 NOTE — Telephone Encounter (Signed)
Daughter is having concerns with increased SOB and fatigue [facility] will go out to evaluate and if SOB is bad enough will advise to go to Er

## 2012-09-27 ENCOUNTER — Telehealth (HOSPITAL_COMMUNITY): Payer: Self-pay | Admitting: *Deleted

## 2012-09-27 NOTE — Telephone Encounter (Signed)
Called pt's daughter with results of overnight sleep test which did show O2 dropped and per Dr Gala Romney pt needs O2 at night, pt's daughter states she has been back in the hospital and was tested and started on home O2 when she was discharged

## 2012-09-29 ENCOUNTER — Telehealth: Payer: Self-pay | Admitting: *Deleted

## 2012-09-29 NOTE — Telephone Encounter (Signed)
Carollee Herter, It is fine for the patient to continue speech therapy for 4 more visits. Thx! Rene Kocher

## 2012-09-29 NOTE — Telephone Encounter (Signed)
Daisy Lazar, speech/language pathologist with [facility], to let her know that Nicki Reaper, NP ok'd 4 more visits for pt. [phone]).

## 2012-09-29 NOTE — Telephone Encounter (Signed)
Revonda Standard, speech pathologist with [facility] called requesting an extension of speech therapy for 4 more visits with pt. She plans to continue with her memory education. A verbal order is acceptable. Please advise.

## 2012-10-06 ENCOUNTER — Encounter: Payer: Self-pay | Admitting: Internal Medicine

## 2012-10-16 ENCOUNTER — Encounter (HOSPITAL_COMMUNITY): Payer: Self-pay

## 2012-10-16 ENCOUNTER — Ambulatory Visit (HOSPITAL_COMMUNITY)
Admission: RE | Admit: 2012-10-16 | Discharge: 2012-10-16 | Disposition: A | Discharge status: Home / Self Care | Payer: Medicare Other | Source: Ambulatory Visit | Attending: Internal Medicine | Admitting: Internal Medicine

## 2012-10-16 VITALS — BP 108/52 | HR 68 | Wt 121.4 lb

## 2012-10-16 DIAGNOSIS — I080 Rheumatic disorders of both mitral and aortic valves: Secondary | ICD-10-CM

## 2012-10-16 DIAGNOSIS — I5032 Chronic diastolic (congestive) heart failure: Secondary | ICD-10-CM

## 2012-10-16 LAB — BASIC METABOLIC PANEL
BUN: 16 mg/dL (ref 6–23)
CO2: 33 mEq/L — ABNORMAL HIGH (ref 19–32)
Chloride: 95 mEq/L — ABNORMAL LOW (ref 96–112)
Creatinine, Ser: 1.21 mg/dL — ABNORMAL HIGH (ref 0.50–1.10)
GFR calc Af Amer: 44 mL/min — ABNORMAL LOW (ref >90)
Potassium: 4.5 mEq/L (ref 3.5–5.1)

## 2012-10-16 MED ORDER — FUROSEMIDE 40 MG PO TABS: 60.0000 mg | ORAL_TABLET | Freq: Two times a day (BID) | ORAL | Status: DC

## 2012-10-16 MED ORDER — POTASSIUM CHLORIDE 40 MEQ/15ML (20%) PO LIQD: 40.0000 meq | Freq: Two times a day (BID) | ORAL | Status: DC

## 2012-10-16 NOTE — Patient Instructions (Addendum)
Take lasix 60 mg twice a day  Take Potassium 40 meq twice a day  If weight is > 118.5 pounds take 80 mg in am and 60 mg in pm  If weight is < 116 pounds take 60 mg in am and 40 mg pm  Do the following things EVERYDAY: 1) Weigh yourself in the morning before breakfast. Write it down and keep it in a log. 2) Take your medicines as prescribed 3) Eat low salt foods-Limit salt (sodium) to 2000 mg per day.  4) Stay as active as you can everyday 5) Limit all fluids for the day to less than 2 liters  Follow up in 2 weeks

## 2012-10-16 NOTE — Assessment & Plan Note (Addendum)
Volume status mildly elevated. Narrow euvolemic window. (116-118 pounds). Increase lasix  60 mg twice a day and increase potassium to Potassium 40 meq twice a day. Provided the following sliding scale diuretic regimen. If weight is > 118.5 pounds take 80 mg in am and 60 mg in pm. If weight is < 116 pounds take 60 mg in am and 40 mg pm. Check BMET today. Follow up in 2 weeks to reassess volume.

## 2012-10-20 NOTE — Assessment & Plan Note (Signed)
Not candidate for surgical repair or MV clip due to age. Continue medical management.   

## 2012-10-20 NOTE — Progress Notes (Signed)
Patient ID: [name], female   DOB: [birth year], 78 y.o.   MRN: [id number] Primary Cardiologist: Dr. Clifton James PCP: Dr. Debby Bud   HPI:  [name] is a 78 y.o. female with history of diastolic heart failure, severe mitral regurgitation, HL and hypothyroidism.  She is followed by Dr. Clifton James and was recently discharged for acute exacerbation of heart failure.  She diuresed 3.1 L and was discharged 10/30 on lasix 60 mg BID (up from 40 mg BID).  Her Cr increased 1.7 at follow up and lasix decreased 40 mg BID and Cr improved 1.4.    Echo 08/28/12: LVEF 55-60%.  Mild AI, Severe MR.  LA Mod to severely dilated.  PFO/small ASD.  PAPP 70 mmHg.    [facility]charged from [facility] 09/25/12 after being diuresed a few pounds. She was placed continuous 2 liters [place].   She returns for post hospitalization follow up with her son and daughter. Denies SOB/PND. + Orthopnea -sleeps on 2 pillows.  She continues on Lasix 60 mg in am and 40 mg in pm. Weight at home 116-118 pounds. Followed by [facility] for heart failure management. Ambulates with walker. Follows low salt diet. SBP is soft at home.    ROS: All systems negative except as listed in HPI, PMH and Problem List.  Past Medical History  Diagnosis Date  . Coronary artery disease   . Peripheral neuropathy   . Aortic insufficiency     mild  . Cerumen impaction   . Unspecified diastolic heart failure   . Hyperlipemia   . Mitral valve prolapse     "severe" (09/22/2012)  . Mitral regurgitation     severe  . Urosepsis   . Anemia, unspecified   . Osteoporosis   . Adenocarcinoma, breast   . Irritable bladder   . Urinary incontinence   . Hypothyroidism   . GERD (gastroesophageal reflux disease)   . H/O: whooping cough   . Hypercholesterolemia     "stopped statins ~ 2 yr ago" (09/22/2012)  . Heart murmur   . CHF (congestive heart failure)   . Shortness of breath     "basically all the time; worse w/exertion" (09/22/2012)  . Bleeding hemorrhoids ~ 2011  .  Osteoarthritis     "knees, hips, hands" (09/22/2012)    Current Outpatient Prescriptions  Medication Sig Dispense Refill  . aspirin EC 81 MG tablet Take 81 mg by mouth every morning.      . calcium-vitamin D (OSCAL WITH D) 500-200 MG-UNIT per tablet Take 1 tablet by mouth 3 (three) times daily.       . carvedilol (COREG) 25 MG tablet Take 25 mg by mouth 2 (two) times daily with a meal.      . cycloSPORINE (RESTASIS) 0.05 % ophthalmic emulsion Place 1 drop into both eyes 2 (two) times daily.       . furosemide (LASIX) 40 MG tablet Take 1.5 tablets (60 mg total) by mouth 2 (two) times daily.  60 tablet  3  . guaiFENesin (MUCINEX) 600 MG 12 hr tablet Take 1,200 mg by mouth 2 (two) times daily.      . Multiple Vitamins-Minerals (ICAPS MV PO) Take 2 tablets by mouth 2 (two) times daily.      . naproxen sodium (ANAPROX) 220 MG tablet Take 220 mg by mouth at bedtime as needed. For pain      . omega-3 acid ethyl esters (LOVAZA) 1 G capsule Take 2 g by mouth every evening.      [name]  Polyethyl Glycol-Propyl Glycol (SYSTANE) 0.4-0.3 % GEL Place 1 drop into both eyes at bedtime.      . potassium chloride 40 MEQ/15ML (20%) LIQD Take 15 mLs (40 mEq total) by mouth 2 (two) times daily.  600 mL  3  . sulfamethoxazole-trimethoprim (BACTRIM,SEPTRA) 400-80 MG per tablet Take 1 tablet by mouth every evening.      . sodium chloride (OCEAN) 0.65 % nasal spray Place 1 spray into the nose 3 (three) times daily as needed. For dryness         PHYSICAL EXAM: Filed Vitals:   10/16/12 1512  BP: 108/52  Pulse: 68  Weight: 121 lb 6.4 oz (55.067 kg)  SpO2: 97%    General:  Elderly in wheelchair.  No resp difficulty son and daughter present HEENT: normal Neck: supple. JVP 8-9. Carotids 2+ bilaterally; no bruits. No lymphadenopathy or thryomegaly appreciated. Cor: s/p L mastectomy. PMI normal. Regular rate & rhythm. 3/6 MR murmur. No rubs, gallops. Lungs: RLL LUL LLL crackles on 2 liters [place] Abdomen: soft, nontender,  nondistended. No hepatosplenomegaly. No bruits or masses. Good bowel sounds. Extremities: no cyanosis, clubbing, rash,  edema Neuro: alert & orientedx3, cranial nerves grossly intact. Moves all 4 extremities w/o difficulty. Affect pleasant.    ASSESSMENT & PLAN:

## 2012-10-30 DIAGNOSIS — I5032 Chronic diastolic (congestive) heart failure: Secondary | ICD-10-CM

## 2012-11-02 ENCOUNTER — Ambulatory Visit (HOSPITAL_COMMUNITY)
Admission: RE | Admit: 2012-11-02 | Discharge: 2012-11-02 | Disposition: A | Discharge status: Home / Self Care | Payer: Medicare Other | Source: Ambulatory Visit | Attending: Internal Medicine | Admitting: Internal Medicine

## 2012-11-02 ENCOUNTER — Encounter (HOSPITAL_COMMUNITY): Payer: Self-pay

## 2012-11-02 VITALS — BP 126/68 | HR 86 | Wt 119.0 lb

## 2012-11-02 DIAGNOSIS — I5032 Chronic diastolic (congestive) heart failure: Secondary | ICD-10-CM

## 2012-11-02 DIAGNOSIS — I080 Rheumatic disorders of both mitral and aortic valves: Secondary | ICD-10-CM

## 2012-11-02 DIAGNOSIS — I059 Rheumatic mitral valve disease, unspecified: Secondary | ICD-10-CM | POA: Insufficient documentation

## 2012-11-02 NOTE — Progress Notes (Signed)
Primary Cardiologist: Dr. Clifton James PCP: Dr. Debby Bud   HPI:  [name] is a 79 y.o. female with history of diastolic heart failure, severe mitral regurgitation, HL and hypothyroidism.  She is followed by Dr. Clifton James and was recently discharged for acute exacerbation of heart failure.  She diuresed 3.1 L and was discharged 10/30 on lasix 60 mg BID (up from 40 mg BID).  Her Cr increased 1.7 at follow up and lasix decreased 40 mg BID and Cr improved 1.4.    Echo 08/28/12: LVEF 55-60%.  Mild AI, Severe MR.  LA Mod to severely dilated.  PFO/small ASD.  PAPP 70 mmHg.    Di[facility]charged from [facility] 09/25/12 after being diuresed a few pounds. She was placed continuous 2 liters [place].   She returns for follow up with her son and daughter.  She feels well.  She enjoyed her holidays.  Her weight today is 115.4 today.  Weight 115-118 therefore using sliding scale.  Has used increased lasix twice since follow up.  She uses walker to ambulate.  She denies dyspnea, orthopnea or PND. [facility] RN now following every 2 weeks.      ROS: All systems negative except as listed in HPI, PMH and Problem List.  Past Medical History  Diagnosis Date  . Coronary artery disease   . Peripheral neuropathy   . Aortic insufficiency     mild  . Cerumen impaction   . Unspecified diastolic heart failure   . Hyperlipemia   . Mitral valve prolapse     "severe" (09/22/2012)  . Mitral regurgitation     severe  . Urosepsis   . Anemia, unspecified   . Osteoporosis   . Adenocarcinoma, breast   . Irritable bladder   . Urinary incontinence   . Hypothyroidism   . GERD (gastroesophageal reflux disease)   . H/O: whooping cough   . Hypercholesterolemia     "stopped statins ~ 2 yr ago" (09/22/2012)  . Heart murmur   . CHF (congestive heart failure)   . Shortness of breath     "basically all the time; worse w/exertion" (09/22/2012)  . Bleeding hemorrhoids ~ 2011  . Osteoarthritis     "knees, hips, hands" (09/22/2012)    Current  Outpatient Prescriptions  Medication Sig Dispense Refill  . aspirin EC 81 MG tablet Take 81 mg by mouth every morning.      . calcium-vitamin D (OSCAL WITH D) 500-200 MG-UNIT per tablet Take 1 tablet by mouth 3 (three) times daily.       . carvedilol (COREG) 25 MG tablet Take 25 mg by mouth 2 (two) times daily with a meal.      . cycloSPORINE (RESTASIS) 0.05 % ophthalmic emulsion Place 1 drop into both eyes 2 (two) times daily.       . furosemide (LASIX) 40 MG tablet Take 1.5 tablets (60 mg total) by mouth 2 (two) times daily.  60 tablet  3  . guaiFENesin (MUCINEX) 600 MG 12 hr tablet Take 1,200 mg by mouth 2 (two) times daily.      . Multiple Vitamins-Minerals (ICAPS MV PO) Take 2 tablets by mouth 2 (two) times daily.      . naproxen sodium (ANAPROX) 220 MG tablet Take 220 mg by mouth at bedtime as needed. For pain      . omega-3 acid ethyl esters (LOVAZA) 1 G capsule Take 2 g by mouth every evening.      Bertram Gala Glycol-Propyl Glycol (SYSTANE) 0.4-0.3 % GEL Place 1  drop into both eyes at bedtime.      . potassium chloride 40 MEQ/15ML (20%) LIQD Take 15 mLs (40 mEq total) by mouth 2 (two) times daily.  600 mL  3  . sodium chloride (OCEAN) 0.65 % nasal spray Place 1 spray into the nose 3 (three) times daily as needed. For dryness      . sulfamethoxazole-trimethoprim (BACTRIM,SEPTRA) 400-80 MG per tablet Take 1 tablet by mouth every evening.         PHYSICAL EXAM: Filed Vitals:   11/02/12 1451  BP: 126/68  Pulse: 86  Weight: 119 lb (53.978 kg)  SpO2: 97%    General:  Elderly in wheelchair.  No resp difficulty son and daughter present HEENT: normal Neck: supple. JVP 7-8. Carotids 2+ bilaterally; no bruits. No lymphadenopathy or thryomegaly appreciated. Cor: s/p L mastectomy. PMI normal. Regular rate & rhythm. 3/6 MR murmur. No rubs, gallops. Lungs: clear on 2 liters [place] Abdomen: soft, nontender, nondistended. No hepatosplenomegaly. No bruits or masses. Good bowel sounds. Extremities:  no cyanosis, clubbing, rash,  edema Neuro: alert & orientedx3, cranial nerves grossly intact. Moves all 4 extremities w/o difficulty. Affect pleasant.    ASSESSMENT & PLAN:

## 2012-11-05 NOTE — Assessment & Plan Note (Signed)
Volume status well controlled on current sliding scale lasix.  Will continue current regimen.  Encouraged them to keep up the good work and daily weights.

## 2012-11-05 NOTE — Assessment & Plan Note (Signed)
Not candidate for surgical repair or MV clip due to age. Continue medical management.

## 2012-11-06 DIAGNOSIS — R0989 Other specified symptoms and signs involving the circulatory and respiratory systems: Secondary | ICD-10-CM

## 2012-11-21 ENCOUNTER — Other Ambulatory Visit: Payer: Self-pay | Admitting: Internal Medicine

## 2012-12-04 ENCOUNTER — Encounter (HOSPITAL_COMMUNITY): Payer: Self-pay

## 2012-12-04 ENCOUNTER — Ambulatory Visit (HOSPITAL_COMMUNITY)
Admission: RE | Admit: 2012-12-04 | Discharge: 2012-12-04 | Disposition: A | Discharge status: Home / Self Care | Payer: Medicare Other | Source: Ambulatory Visit | Attending: Internal Medicine | Admitting: Internal Medicine

## 2012-12-04 VITALS — BP 116/62 | HR 79 | Wt 118.0 lb

## 2012-12-04 DIAGNOSIS — I5032 Chronic diastolic (congestive) heart failure: Secondary | ICD-10-CM | POA: Insufficient documentation

## 2012-12-04 DIAGNOSIS — I080 Rheumatic disorders of both mitral and aortic valves: Secondary | ICD-10-CM

## 2012-12-04 LAB — BASIC METABOLIC PANEL
CO2: 32 mEq/L (ref 19–32)
Calcium: 9.7 mg/dL (ref 8.4–10.5)
Creatinine, Ser: 1.19 mg/dL — ABNORMAL HIGH (ref 0.50–1.10)
GFR calc non Af Amer: 38 mL/min — ABNORMAL LOW (ref >90)
Sodium: 138 mEq/L (ref 135–145)

## 2012-12-04 NOTE — Assessment & Plan Note (Addendum)
Volume status minimally elevated but she is symptomatically much improved. Reinforced need for daily weights and reviewed use of sliding scale diuretics with her and her family. Will continue current regimen of 60/40 mg with increase to 60/60 mg if weight climbs or +dyspnea.  Will check BMET today. Will also bring their blood pressure cuff to follow up appointment to make sure it does not need to be calibrated.

## 2012-12-04 NOTE — Patient Instructions (Addendum)
Labs today.  Follow up 1 month.  Bring cuff at next visit to follow up

## 2012-12-04 NOTE — Assessment & Plan Note (Addendum)
Not good candidate for surgical repair or clip at this point, continue medical therapy.

## 2012-12-12 ENCOUNTER — Other Ambulatory Visit: Payer: Self-pay | Admitting: Internal Medicine

## 2012-12-18 ENCOUNTER — Telehealth: Payer: Self-pay

## 2012-12-18 NOTE — Telephone Encounter (Signed)
Ok to refill bactrim

## 2012-12-18 NOTE — Telephone Encounter (Signed)
Daughter called [facility] to refill Batrium, was denied per pharmacy. Thanks

## 2012-12-18 NOTE — Telephone Encounter (Signed)
Please advise if ok to refill abx

## 2012-12-19 ENCOUNTER — Other Ambulatory Visit: Payer: Self-pay | Admitting: *Deleted

## 2012-12-19 MED ORDER — SULFAMETHOXAZOLE-TRIMETHOPRIM 400-80 MG PO TABS: 1.0000 | ORAL_TABLET | Freq: Every evening | ORAL | Status: DC

## 2012-12-19 NOTE — Telephone Encounter (Signed)
Closing this encounter dur to a new one open 12/19/12

## 2012-12-19 NOTE — Telephone Encounter (Signed)
Ok per Dr Debby Bud.

## 2012-12-31 NOTE — Progress Notes (Signed)
Primary Cardiologist: Dr. Clifton James PCP: Dr. Debby Bud   HPI:  [name] is a 79 y.o. female with history of diastolic heart failure, severe mitral regurgitation, HL and hypothyroidism.  She is followed by Dr. Clifton James and was recently discharged for acute exacerbation of heart failure.  She diuresed 3.1 L and was discharged 10/30 on lasix 60 mg BID (up from 40 mg BID).  Her Cr increased 1.7 at follow up and lasix decreased 40 mg BID and Cr improved 1.4.    Echo 08/28/12: LVEF 55-60%.  Mild AI, Severe MR.  LA Mod to severely dilated.  PFO/small ASD.  PAPP 70 mmHg.    Diischarged from [facility] 09/25/12 after being diuresed a few pounds. She was placed continuous 2 liters [place].   She returns for follow up with her son and daughter.  She feels well.  She denies dyspnea, orthopnea or PND.  Weight is 114-116 pounds.  Has used increased lasix once since follow up.  She uses walker to ambulate.  [facility] is stopping services and taking equipment this week.    ROS: All systems negative except as listed in HPI, PMH and Problem List.  Past Medical History  Diagnosis Date  . Coronary artery disease   . Peripheral neuropathy   . Aortic insufficiency     mild  . Cerumen impaction   . Unspecified diastolic heart failure   . Hyperlipemia   . Mitral valve prolapse     "severe" (09/22/2012)  . Mitral regurgitation     severe  . Urosepsis   . Anemia, unspecified   . Osteoporosis   . Adenocarcinoma, breast   . Irritable bladder   . Urinary incontinence   . Hypothyroidism   . GERD (gastroesophageal reflux disease)   . H/O: whooping cough   . Hypercholesterolemia     "stopped statins ~ 2 yr ago" (09/22/2012)  . Heart murmur   . CHF (congestive heart failure)   . Shortness of breath     "basically all the time; worse w/exertion" (09/22/2012)  . Bleeding hemorrhoids ~ 2011  . Osteoarthritis     "knees, hips, hands" (09/22/2012)    Current Outpatient Prescriptions  Medication Sig Dispense Refill  .  aspirin EC 81 MG tablet Take 81 mg by mouth every morning.      . calcium-vitamin D (OSCAL WITH D) 500-200 MG-UNIT per tablet Take 1 tablet by mouth 3 (three) times daily.       . carvedilol (COREG) 25 MG tablet Take 25 mg by mouth 2 (two) times daily with a meal.      . cycloSPORINE (RESTASIS) 0.05 % ophthalmic emulsion Place 1 drop into both eyes 2 (two) times daily.       . furosemide (LASIX) 40 MG tablet Take 1.5 tablets (60 mg total) by mouth 2 (two) times daily.  60 tablet  3  . guaiFENesin (MUCINEX) 600 MG 12 hr tablet Take 1,200 mg by mouth 2 (two) times daily.      . Multiple Vitamins-Minerals (ICAPS MV PO) Take 2 tablets by mouth 2 (two) times daily.      . naproxen sodium (ANAPROX) 220 MG tablet Take 220 mg by mouth at bedtime as needed. For pain      . omega-3 acid ethyl esters (LOVAZA) 1 G capsule Take 2 g by mouth every evening.      Bertram Gala Glycol-Propyl Glycol (SYSTANE) 0.4-0.3 % GEL Place 1 drop into both eyes at bedtime.      . potassium  chloride 40 MEQ/15ML (20%) LIQD Take 15 mLs (40 mEq total) by mouth 2 (two) times daily.  600 mL  3  . sodium chloride (OCEAN) 0.65 % nasal spray Place 1 spray into the nose 3 (three) times daily as needed. For dryness      . sulfamethoxazole-trimethoprim (BACTRIM,SEPTRA) 400-80 MG per tablet Take 1 tablet by mouth every evening.  30 tablet  5   No current facility-administered medications for this encounter.     PHYSICAL EXAM: Filed Vitals:   12/04/12 1501  BP: 116/62  Pulse: 79  Weight: 118 lb (53.524 kg)  SpO2: 99%    General:  Elderly in wheelchair.  No resp difficulty son and daughter present HEENT: normal Neck: supple. JVP 7-8. Carotids 2+ bilaterally; no bruits. No lymphadenopathy or thryomegaly appreciated. Cor: s/p L mastectomy. PMI normal. Regular rate & rhythm. 3/6 MR murmur. No rubs, gallops. Lungs: clear on 2 liters  Abdomen: soft, nontender, nondistended. No hepatosplenomegaly. No bruits or masses. Good bowel  sounds. Extremities: no cyanosis, clubbing, rash, tr edema Neuro: alert & orientedx3, cranial nerves grossly intact. Moves all 4 extremities w/o difficulty. Affect pleasant.    ASSESSMENT & PLAN:

## 2013-01-01 ENCOUNTER — Encounter (HOSPITAL_COMMUNITY): Payer: Self-pay

## 2013-01-01 ENCOUNTER — Ambulatory Visit (HOSPITAL_COMMUNITY)
Admission: RE | Admit: 2013-01-01 | Discharge: 2013-01-01 | Disposition: A | Discharge status: Home / Self Care | Payer: Medicare Other | Source: Ambulatory Visit | Attending: Internal Medicine | Admitting: Internal Medicine

## 2013-01-01 VITALS — BP 126/60 | HR 86 | Wt 118.4 lb

## 2013-01-01 DIAGNOSIS — I5032 Chronic diastolic (congestive) heart failure: Secondary | ICD-10-CM | POA: Insufficient documentation

## 2013-01-01 NOTE — Progress Notes (Signed)
Patient ID: [name], female   DOB: 11-17-1917, 79 y.o.   MRN: [id number] Primary Cardiologist: Dr. Clifton James PCP: Dr. Debby Bud   HPI:  Ms. Fath is a 79 y.o. female with history of diastolic heart failure, severe mitral regurgitation, HL and hypothyroidism.      Echo 08/28/12: LVEF 55-60%.  Mild AI, Severe MR.  LA Mod to severely dilated.  PFO/small ASD.  PAPP 70 mmHg.    Discharged from [facility] 09/25/12 after being diuresed a few pounds. She was placed continuous 2 liters [place].    She returns for follow up with her son and daughter.  Weight at home 114-116 pounds. Denies SOB/PND/Orthopnea. Remains on 2 liters [place] continuously.Ambulates with a walker.  She has not required any extra lasix. Daughter prepares pill box.    ROS: All systems negative except as listed in HPI, PMH and Problem List.  Past Medical History  Diagnosis Date  . Coronary artery disease   . Peripheral neuropathy   . Aortic insufficiency     mild  . Cerumen impaction   . Unspecified diastolic heart failure   . Hyperlipemia   . Mitral valve prolapse     "severe" (09/22/2012)  . Mitral regurgitation     severe  . Urosepsis   . Anemia, unspecified   . Osteoporosis   . Adenocarcinoma, breast   . Irritable bladder   . Urinary incontinence   . Hypothyroidism   . GERD (gastroesophageal reflux disease)   . H/O: whooping cough   . Hypercholesterolemia     "stopped statins ~ 2 yr ago" (09/22/2012)  . Heart murmur   . CHF (congestive heart failure)   . Shortness of breath     "basically all the time; worse w/exertion" (09/22/2012)  . Bleeding hemorrhoids ~ 2011  . Osteoarthritis     "knees, hips, hands" (09/22/2012)    Current Outpatient Prescriptions  Medication Sig Dispense Refill  . aspirin EC 81 MG tablet Take 81 mg by mouth every morning.      . calcium-vitamin D (OSCAL WITH D) 500-200 MG-UNIT per tablet Take 1 tablet by mouth 3 (three) times daily.       . carvedilol (COREG) 25 MG tablet Take 25 mg  by mouth 2 (two) times daily with a meal.      . cycloSPORINE (RESTASIS) 0.05 % ophthalmic emulsion Place 1 drop into both eyes 2 (two) times daily.       . furosemide (LASIX) 40 MG tablet Take 1.5 tablets (60 mg total) by mouth 2 (two) times daily.  60 tablet  3  . guaiFENesin (MUCINEX) 600 MG 12 hr tablet Take 1,200 mg by mouth 2 (two) times daily.      . Multiple Vitamins-Minerals (ICAPS MV PO) Take 2 tablets by mouth 2 (two) times daily.      . naproxen sodium (ANAPROX) 220 MG tablet Take 220 mg by mouth at bedtime as needed. For pain      . omega-3 acid ethyl esters (LOVAZA) 1 G capsule Take 2 g by mouth every evening.      Bertram Gala Glycol-Propyl Glycol (SYSTANE) 0.4-0.3 % GEL Place 1 drop into both eyes at bedtime.      . potassium chloride 40 MEQ/15ML (20%) LIQD Take 15 mLs (40 mEq total) by mouth 2 (two) times daily.  600 mL  3  . sodium chloride (OCEAN) 0.65 % nasal spray Place 1 spray into the nose 3 (three) times daily as needed. For dryness      .  sulfamethoxazole-trimethoprim (BACTRIM,SEPTRA) 400-80 MG per tablet Take 1 tablet by mouth every evening.  30 tablet  5   No current facility-administered medications for this encounter.     PHYSICAL EXAM: Filed Vitals:   01/01/13 1410  BP: 126/60  Pulse: 86  Weight: 118 lb 6.4 oz (53.706 kg)  SpO2: 98%    General:  Elderly in wheelchair.  No resp difficulty son and daughter present HEENT: normal Neck: supple. JVP 7-8. Carotids 2+ bilaterally; no bruits. No lymphadenopathy or thryomegaly appreciated. Cor: s/p L mastectomy. PMI normal. Regular rate & rhythm. 3/6 MR murmur. No rubs, gallops. Lungs: clear on 2 liters [place] Abdomen: soft, nontender, nondistended. No hepatosplenomegaly. No bruits or masses. Good bowel sounds. Extremities: no cyanosis, clubbing, rash, tr edema Neuro: alert & orientedx3, cranial nerves grossly intact. Moves all 4 extremities w/o difficulty. Affect pleasant.    ASSESSMENT & PLAN:

## 2013-01-01 NOTE — Assessment & Plan Note (Signed)
Volume status stable. Continue current diuretic regimen. Follow up in 2 months.

## 2013-01-01 NOTE — Patient Instructions (Addendum)
Follow up in 2 months  Do the following things EVERYDAY: 1) Weigh yourself in the morning before breakfast. Write it down and keep it in a log. 2) Take your medicines as prescribed 3) Eat low salt foods-Limit salt (sodium) to 2000 mg per day.  4) Stay as active as you can everyday 5) Limit all fluids for the day to less than 2 liters 

## 2013-01-26 ENCOUNTER — Other Ambulatory Visit: Payer: Self-pay | Admitting: Cardiology

## 2013-01-26 DIAGNOSIS — I5033 Acute on chronic diastolic (congestive) heart failure: Secondary | ICD-10-CM

## 2013-01-26 MED ORDER — CARVEDILOL 25 MG PO TABS: 25.0000 mg | ORAL_TABLET | Freq: Two times a day (BID) | ORAL | Status: DC

## 2013-02-07 ENCOUNTER — Telehealth (HOSPITAL_COMMUNITY): Payer: Self-pay | Admitting: Cardiology

## 2013-02-07 NOTE — Telephone Encounter (Signed)
[name] called- pt would like to start a hemorrhoidal suppository, however meds is considered a vasoconstrictor and has a heart waring on box.  Phenylephrine hcl 0.25%  Please advise

## 2013-02-07 NOTE — Telephone Encounter (Signed)
Opened in error

## 2013-02-09 NOTE — Telephone Encounter (Signed)
Discussed with Leota Sauers, PharmD ok per her since small dose, Tresa Endo is aware

## 2013-03-01 ENCOUNTER — Other Ambulatory Visit (HOSPITAL_COMMUNITY): Payer: Self-pay | Admitting: *Deleted

## 2013-03-01 MED ORDER — POTASSIUM CHLORIDE 40 MEQ/15ML (20%) PO LIQD: 40.0000 meq | Freq: Two times a day (BID) | ORAL | Status: DC

## 2013-03-07 ENCOUNTER — Encounter (HOSPITAL_COMMUNITY): Payer: Medicare Other

## 2013-03-08 ENCOUNTER — Ambulatory Visit (HOSPITAL_COMMUNITY)
Admission: RE | Admit: 2013-03-08 | Discharge: 2013-03-08 | Disposition: A | Discharge status: Home / Self Care | Payer: Medicare Other | Source: Ambulatory Visit | Attending: Internal Medicine | Admitting: Internal Medicine

## 2013-03-08 VITALS — BP 118/64 | HR 73 | Wt 117.0 lb

## 2013-03-08 DIAGNOSIS — N3289 Other specified disorders of bladder: Secondary | ICD-10-CM | POA: Insufficient documentation

## 2013-03-08 DIAGNOSIS — K219 Gastro-esophageal reflux disease without esophagitis: Secondary | ICD-10-CM | POA: Insufficient documentation

## 2013-03-08 DIAGNOSIS — E785 Hyperlipidemia, unspecified: Secondary | ICD-10-CM | POA: Insufficient documentation

## 2013-03-08 DIAGNOSIS — I5032 Chronic diastolic (congestive) heart failure: Secondary | ICD-10-CM

## 2013-03-08 DIAGNOSIS — Z853 Personal history of malignant neoplasm of breast: Secondary | ICD-10-CM | POA: Insufficient documentation

## 2013-03-08 DIAGNOSIS — M810 Age-related osteoporosis without current pathological fracture: Secondary | ICD-10-CM | POA: Insufficient documentation

## 2013-03-08 DIAGNOSIS — I5030 Unspecified diastolic (congestive) heart failure: Secondary | ICD-10-CM | POA: Insufficient documentation

## 2013-03-08 DIAGNOSIS — G609 Hereditary and idiopathic neuropathy, unspecified: Secondary | ICD-10-CM | POA: Insufficient documentation

## 2013-03-08 DIAGNOSIS — E039 Hypothyroidism, unspecified: Secondary | ICD-10-CM | POA: Insufficient documentation

## 2013-03-08 DIAGNOSIS — Z79899 Other long term (current) drug therapy: Secondary | ICD-10-CM | POA: Insufficient documentation

## 2013-03-08 DIAGNOSIS — E7800 Pure hypercholesterolemia, unspecified: Secondary | ICD-10-CM | POA: Insufficient documentation

## 2013-03-08 DIAGNOSIS — I509 Heart failure, unspecified: Secondary | ICD-10-CM | POA: Insufficient documentation

## 2013-03-08 DIAGNOSIS — Z7982 Long term (current) use of aspirin: Secondary | ICD-10-CM | POA: Insufficient documentation

## 2013-03-08 DIAGNOSIS — M1990 Unspecified osteoarthritis, unspecified site: Secondary | ICD-10-CM | POA: Insufficient documentation

## 2013-03-08 DIAGNOSIS — I059 Rheumatic mitral valve disease, unspecified: Secondary | ICD-10-CM | POA: Insufficient documentation

## 2013-03-08 DIAGNOSIS — I080 Rheumatic disorders of both mitral and aortic valves: Secondary | ICD-10-CM

## 2013-03-08 NOTE — Patient Instructions (Addendum)
   Follow up in 4 months  Do the following things EVERYDAY: 1) Weigh yourself in the morning before breakfast. Write it down and keep it in a log. 2) Take your medicines as prescribed 3) Eat low salt foods-Limit salt (sodium) to 2000 mg per day.  4) Stay as active as you can everyday 5) Limit all fluids for the day to less than 2 liters 

## 2013-03-08 NOTE — Assessment & Plan Note (Addendum)
She is doing well with close monitoring of her intake and weights. Volume status stable.Continue current diuretic regimen. Follow up in 3-4  Months.

## 2013-03-12 NOTE — Progress Notes (Signed)
Patient ID: [name], female   DOB: 04/13/1918, 79 y.o.   MRN: [id number] Primary Cardiologist: Dr. Clifton James PCP: Dr. Debby Bud   HPI: [name] is a 79 y.o. female with history of diastolic heart failure, severe mitral regurgitation, HL and hypothyroidism.      Echo 08/28/12: LVEF 55-60%.  Mild AI, Severe MR.  LA Mod to severely dilated.  PFO/small ASD.  PAPP 70 mmHg.    Discharged from [facility] 09/25/12 after being diuresed a few pounds. She was placed continuous 2 liters [place].    She returns for follow up with her son and daughter.  Weight at home trending down form 116 to 114 pounds. 116 pounds. Denies SOB/PND/Orthopnea. Remains on 2 liters  continuously.Ambulates with a walker.  She has not required any extra lasix. Daughter prepares pill box.    ROS: All systems negative except as listed in HPI, PMH and Problem List.  Past Medical History  Diagnosis Date  . Coronary artery disease   . Peripheral neuropathy   . Aortic insufficiency     mild  . Cerumen impaction   . Unspecified diastolic heart failure   . Hyperlipemia   . Mitral valve prolapse     "severe" (09/22/2012)  . Mitral regurgitation     severe  . Urosepsis   . Anemia, unspecified   . Osteoporosis   . Adenocarcinoma, breast   . Irritable bladder   . Urinary incontinence   . Hypothyroidism   . GERD (gastroesophageal reflux disease)   . H/O: whooping cough   . Hypercholesterolemia     "stopped statins ~ 2 yr ago" (09/22/2012)  . Heart murmur   . CHF (congestive heart failure)   . Shortness of breath     "basically all the time; worse w/exertion" (09/22/2012)  . Bleeding hemorrhoids ~ 2011  . Osteoarthritis     "knees, hips, hands" (09/22/2012)    Current Outpatient Prescriptions  Medication Sig Dispense Refill  . aspirin EC 81 MG tablet Take 81 mg by mouth every morning.      . calcium-vitamin D (OSCAL WITH D) 500-200 MG-UNIT per tablet Take 1 tablet by mouth 3 (three) times daily.       . carvedilol  (COREG) 25 MG tablet Take 1 tablet (25 mg total) by mouth 2 (two) times daily with a meal.  60 tablet  5  . cycloSPORINE (RESTASIS) 0.05 % ophthalmic emulsion Place 1 drop into both eyes 2 (two) times daily.       . furosemide (LASIX) 40 MG tablet Take 60 mg by mouth 2 (two) times daily. Take 60 mg  In am and 40 mg in pm      . guaiFENesin (MUCINEX) 600 MG 12 hr tablet Take 1,200 mg by mouth 2 (two) times daily.      . Multiple Vitamins-Minerals (ICAPS MV PO) Take 2 tablets by mouth 2 (two) times daily.      . naproxen sodium (ANAPROX) 220 MG tablet Take 220 mg by mouth at bedtime as needed. For pain      . omega-3 acid ethyl esters (LOVAZA) 1 G capsule Take 2 g by mouth every evening.      Bertram Gala Glycol-Propyl Glycol (SYSTANE) 0.4-0.3 % GEL Place 1 drop into both eyes at bedtime.      . potassium chloride 40 MEQ/15ML (20%) LIQD Take 15 mLs (40 mEq total) by mouth 2 (two) times daily.  600 mL  3  . sodium chloride (OCEAN) 0.65 % nasal  spray Place 1 spray into the nose 3 (three) times daily as needed. For dryness      . sulfamethoxazole-trimethoprim (BACTRIM,SEPTRA) 400-80 MG per tablet Take 1 tablet by mouth every evening.  30 tablet  5   No current facility-administered medications for this encounter.     PHYSICAL EXAM: Filed Vitals:   03/08/13 1510  BP: 118/64  Pulse: 73  Weight: 117 lb (53.071 kg)  SpO2: 98%    General:  Elderly in wheelchair.  No resp difficulty son and daughter present HEENT: normal Neck: supple. JVP 7-8. Carotids 2+ bilaterally; no bruits. No lymphadenopathy or thryomegaly appreciated. Cor: s/p L mastectomy. PMI normal. Regular rate & rhythm. 3/6 MR murmur. No rubs, gallops. Lungs: clear on 2 liters [place] Abdomen: soft, nontender, nondistended. No hepatosplenomegaly. No bruits or masses. Good bowel sounds. Extremities: no cyanosis, clubbing, rash, tr edema Neuro: alert & orientedx3, cranial nerves grossly intact. Moves all 4 extremities w/o difficulty. Affect  pleasant.    ASSESSMENT & PLAN:

## 2013-03-12 NOTE — Assessment & Plan Note (Signed)
Felt to be too high risk for repair or clip due to age. Doing well with medical therapy.

## 2013-03-23 ENCOUNTER — Encounter: Payer: Self-pay | Admitting: Internal Medicine

## 2013-03-27 ENCOUNTER — Other Ambulatory Visit (INDEPENDENT_AMBULATORY_CARE_PROVIDER_SITE_OTHER): Payer: Medicare Other

## 2013-03-27 ENCOUNTER — Ambulatory Visit (INDEPENDENT_AMBULATORY_CARE_PROVIDER_SITE_OTHER): Payer: Medicare Other | Admitting: Internal Medicine

## 2013-03-27 ENCOUNTER — Encounter: Payer: Self-pay | Admitting: Internal Medicine

## 2013-03-27 VITALS — BP 110/60 | HR 76 | Temp 97.5°F | Wt 115.2 lb

## 2013-03-27 DIAGNOSIS — D649 Anemia, unspecified: Secondary | ICD-10-CM

## 2013-03-27 DIAGNOSIS — R17 Unspecified jaundice: Secondary | ICD-10-CM

## 2013-03-27 LAB — CBC WITH DIFFERENTIAL/PLATELET
Basophils Relative: 0.4 % (ref 0.0–3.0)
Eosinophils Absolute: 0.3 10*3/uL (ref 0.0–0.7)
Eosinophils Relative: 3.6 % (ref 0.0–5.0)
Hemoglobin: 11.4 g/dL — ABNORMAL LOW (ref 12.0–15.0)
Lymphocytes Relative: 25.4 % (ref 12.0–46.0)
MCHC: 34.1 g/dL (ref 30.0–36.0)
MCV: 94.1 fl (ref 78.0–100.0)
Neutro Abs: 5.4 10*3/uL (ref 1.4–7.7)
RBC: 3.57 Mil/uL — ABNORMAL LOW (ref 3.87–5.11)
WBC: 8.9 10*3/uL (ref 4.5–10.5)

## 2013-03-27 NOTE — Patient Instructions (Addendum)
Jaundice - you do not appear jaundiced to me today. Your last liver functions were in '12 and were normal. You take no offending drugs. You do appear a bit sallow  Plan LAB: LIVER FUNCTIONS, CBC  Shingles prevention - no data to know if it really makes a difference after 90, but the vaccine is safe. The decision is yours. (Rx provided.)

## 2013-03-27 NOTE — Progress Notes (Signed)
Subjective:    Patient ID: [name], female    DOB: 1918/02/07, 79 y.o.   MRN: [id number]  HPI Mrs. Ambriz was told by her Gyn that she was jaundiced in appearance. She has had no abdominal pain or preexisting liver disease. Her last LFTs were in '12 and were normal. She does have chronic anemia but her last Hgb in Nov '13 was 11.7g.  She is followed by Dr.  Kittie Plater for heart failure and has been stable. Recent chemistries were normal.   She reports that she is feeling pretty good for 89 and that she is not sort of breath while wearing oxygen. She has a good appetite for chocolat but maybe not for all the good things.   Current Outpatient Prescriptions on File Prior to Visit  Medication Sig Dispense Refill  . aspirin EC 81 MG tablet Take 81 mg by mouth every morning.      . calcium-vitamin D (OSCAL WITH D) 500-200 MG-UNIT per tablet Take 1 tablet by mouth 3 (three) times daily.       . carvedilol (COREG) 25 MG tablet Take 1 tablet (25 mg total) by mouth 2 (two) times daily with a meal.  60 tablet  5  . cycloSPORINE (RESTASIS) 0.05 % ophthalmic emulsion Place 1 drop into both eyes 2 (two) times daily.       . furosemide (LASIX) 40 MG tablet Take 60 mg by mouth 2 (two) times daily. Take 60 mg  In am and 40 mg in pm      . guaiFENesin (MUCINEX) 600 MG 12 hr tablet Take 1,200 mg by mouth 2 (two) times daily.      . naproxen sodium (ANAPROX) 220 MG tablet Take 220 mg by mouth at bedtime as needed. For pain      . omega-3 acid ethyl esters (LOVAZA) 1 G capsule Take 2 g by mouth every evening.      Bertram Gala Glycol-Propyl Glycol (SYSTANE) 0.4-0.3 % GEL Place 1 drop into both eyes at bedtime.      . potassium chloride 40 MEQ/15ML (20%) LIQD Take 15 mLs (40 mEq total) by mouth 2 (two) times daily.  600 mL  3  . sodium chloride (OCEAN) 0.65 % nasal spray Place 1 spray into the nose 3 (three) times daily as needed. For dryness      . sulfamethoxazole-trimethoprim (BACTRIM,SEPTRA) 400-80  MG per tablet Take 1 tablet by mouth every evening.  30 tablet  5   No current facility-administered medications on file prior to visit.   Past Medical History  Diagnosis Date  . Coronary artery disease   . Peripheral neuropathy   . Aortic insufficiency     mild  . Cerumen impaction   . Unspecified diastolic heart failure   . Hyperlipemia   . Mitral valve prolapse     "severe" (09/22/2012)  . Mitral regurgitation     severe  . Urosepsis   . Anemia, unspecified   . Osteoporosis   . Adenocarcinoma, breast   . Irritable bladder   . Urinary incontinence   . Hypothyroidism   . GERD (gastroesophageal reflux disease)   . H/O: whooping cough   . Hypercholesterolemia     "stopped statins ~ 2 yr ago" (09/22/2012)  . Heart murmur   . CHF (congestive heart failure)   . Shortness of breath     "basically all the time; worse w/exertion" (09/22/2012)  . Bleeding hemorrhoids ~ 2011  . Osteoarthritis     "  knees, hips, hands" (09/22/2012)   Past Surgical History  Procedure Laterality Date  . Appendectomy  10/2004  . Cataract extraction w/ intraocular lens  implant, bilateral  ? 1990's  . Breast biopsy      "several on both sides; years ago; none since April 07, 1974" (09/22/2012)  . Tonsillectomy      "as a child" (09/22/2012)  . Mastectomy  ~ 1975    left breast  . Closed reduction patella fracture  ~ 1958    right    Family History  Problem Relation Age of Onset  . Heart disease Mother   . Cancer Mother     breast cancer  . Stroke Father   . Heart disease Father   . Heart disease Sister   . Alzheimer's disease Sister   . Heart disease Sister   . Heart disease Brother    History   Social History  . Marital Status: Married    Spouse Name: N/A    Number of Children: N/A  . Years of Education: N/A   Occupational History  . Not on file.   Social History Main Topics  . Smoking status: Never Smoker   . Smokeless tobacco: Never Used  . Alcohol Use: No  . Drug Use: No  .  Sexually Active: No   Other Topics Concern  . Not on file   Social History Narrative   HSG   Married- '39-'10   2 daughters- 1 died at birth, '24; 1 brother 08-Apr-2043, 2 grandchildren and 2 great grands   Lives with daughter   End of life care:- has a living will- DNR/DNI, no heroic measures   Tobacco Use-no   Alcohol Use- no   Drug use-no       Review of Systems System review is negative for any constitutional, cardiac, pulmonary, GI or neuro symptoms or complaints other than as described in the HPI.     Objective:   Physical Exam Filed Vitals:   03/27/13 1558  BP: 110/60  Pulse: 76  Temp: 97.5 F (36.4 C)   Wt Readings from Last 3 Encounters:  03/27/13 115 lb 3.2 oz (52.254 kg)  03/08/13 117 lb (53.071 kg)  01/01/13 118 lb 6.4 oz (53.706 kg)   General - elderly, very pleasant white woman in a wheel chair. HEENT- CV&S slightly muddy but w/o icterus, PERRLA Cor - 2+ radial RRR Abd - BS+ x 4, no HSM, no tenderness RUQ to deep palpation, no guarding or rebound, no fluid wave.         Assessment & Plan:  1. Jaundice - you do not appear jaundiced to me today. Your last liver functions were in '12 and were normal. You take no offending drugs. You do appear a bit sallow  Plan LAB: LIVER FUNCTIONS, CBC  Recent Results (from the past 2159/04/08 hour(s))  HEPATIC FUNCTION PANEL     Status: Abnormal   Collection Time    03/27/13  4:58 PM      Result Value Range   Total Bilirubin 0.7  0.3 - 1.2 mg/dL   Bilirubin, Direct 0.0  0.0 - 0.3 mg/dL   Alkaline Phosphatase 72  39 - 117 U/L   AST 23  0 - 37 U/L   ALT 15  0 - 35 U/L   Total Protein 7.2  6.0 - 8.3 g/dL   Albumin 3.2 (*) 3.5 - 5.2 g/dL  CBC WITH DIFFERENTIAL     Status: Abnormal   Collection Time  03/27/13  4:58 PM      Result Value Range   WBC 8.9  4.5 - 10.5 K/uL   RBC 3.57 (*) 3.87 - 5.11 Mil/uL   Hemoglobin 11.4 (*) 12.0 - 15.0 g/dL   HCT 16.1 (*) 09.6 - 04.5 %   MCV 94.1  78.0 - 100.0 fl   MCHC 34.1  30.0 -  36.0 g/dL   RDW 40.9 (*) 81.1 - 91.4 %   Platelets 171.0  150.0 - 400.0 K/uL   Neutrophils Relative % 60.3  43.0 - 77.0 %   Lymphocytes Relative 25.4  12.0 - 46.0 %   Monocytes Relative 10.3  3.0 - 12.0 %   Eosinophils Relative 3.6  0.0 - 5.0 %   Basophils Relative 0.4  0.0 - 3.0 %   Neutro Abs 5.4  1.4 - 7.7 K/uL   Lymphs Abs 2.3  0.7 - 4.0 K/uL   Monocytes Absolute 0.9  0.1 - 1.0 K/uL   Eosinophils Absolute 0.3  0.0 - 0.7 K/uL   Basophils Absolute 0.0  0.0 - 0.1 K/uL   Normal labs   2. Shingles prevention - no data to know if it really makes a difference after 90, but the vaccine is safe. The decision is yours. (Rx provided.)

## 2013-03-28 LAB — HEPATIC FUNCTION PANEL
Bilirubin, Direct: 0 mg/dL (ref 0.0–0.3)
Total Bilirubin: 0.7 mg/dL (ref 0.3–1.2)
Total Protein: 7.2 g/dL (ref 6.0–8.3)

## 2013-05-13 IMAGING — CR DG CHEST 2V
2 series · 2 of 2 positions shown · non-contrast
Comparison: Single view of the chest 08/27/2012 and PA and lateral
chest 08/25/2011.

CLINICAL DATA: Shortness of breath.  Congestive heart failure.

CHEST - 2 VIEW

[view not recorded (1 of 2)]
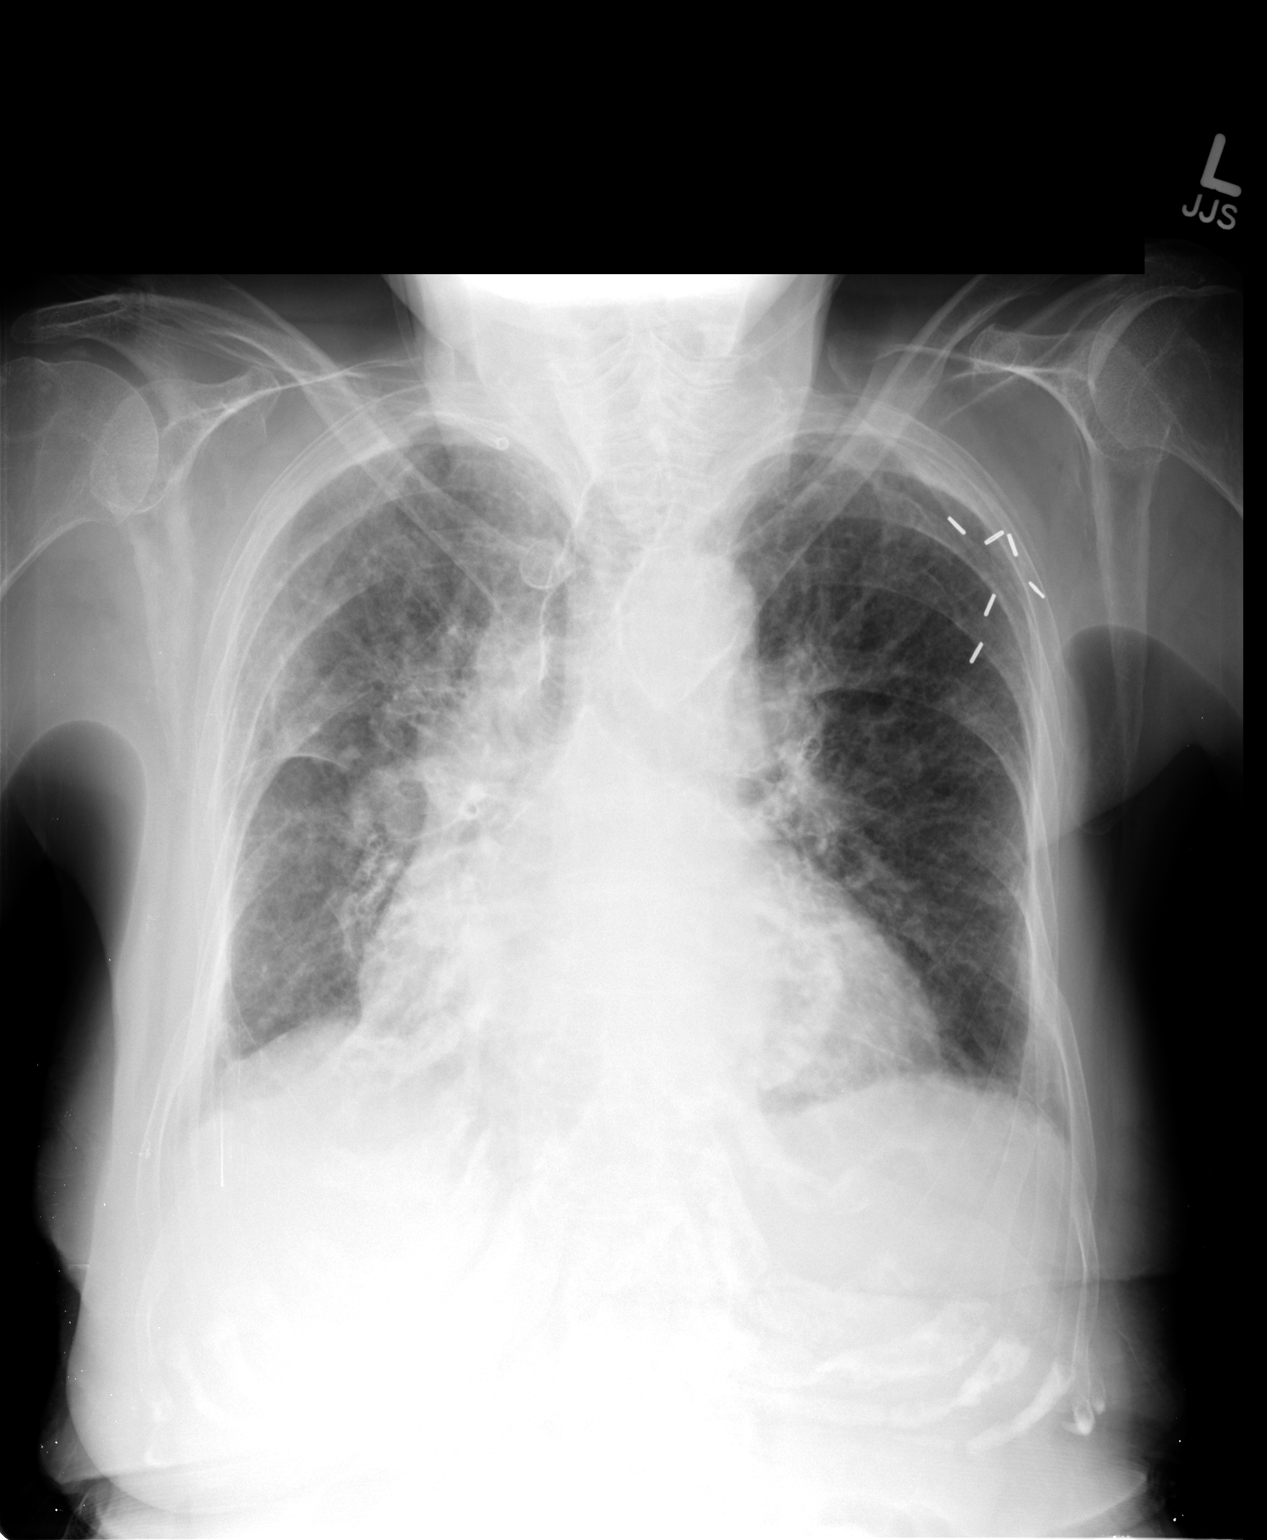

[view not recorded (2 of 2)]
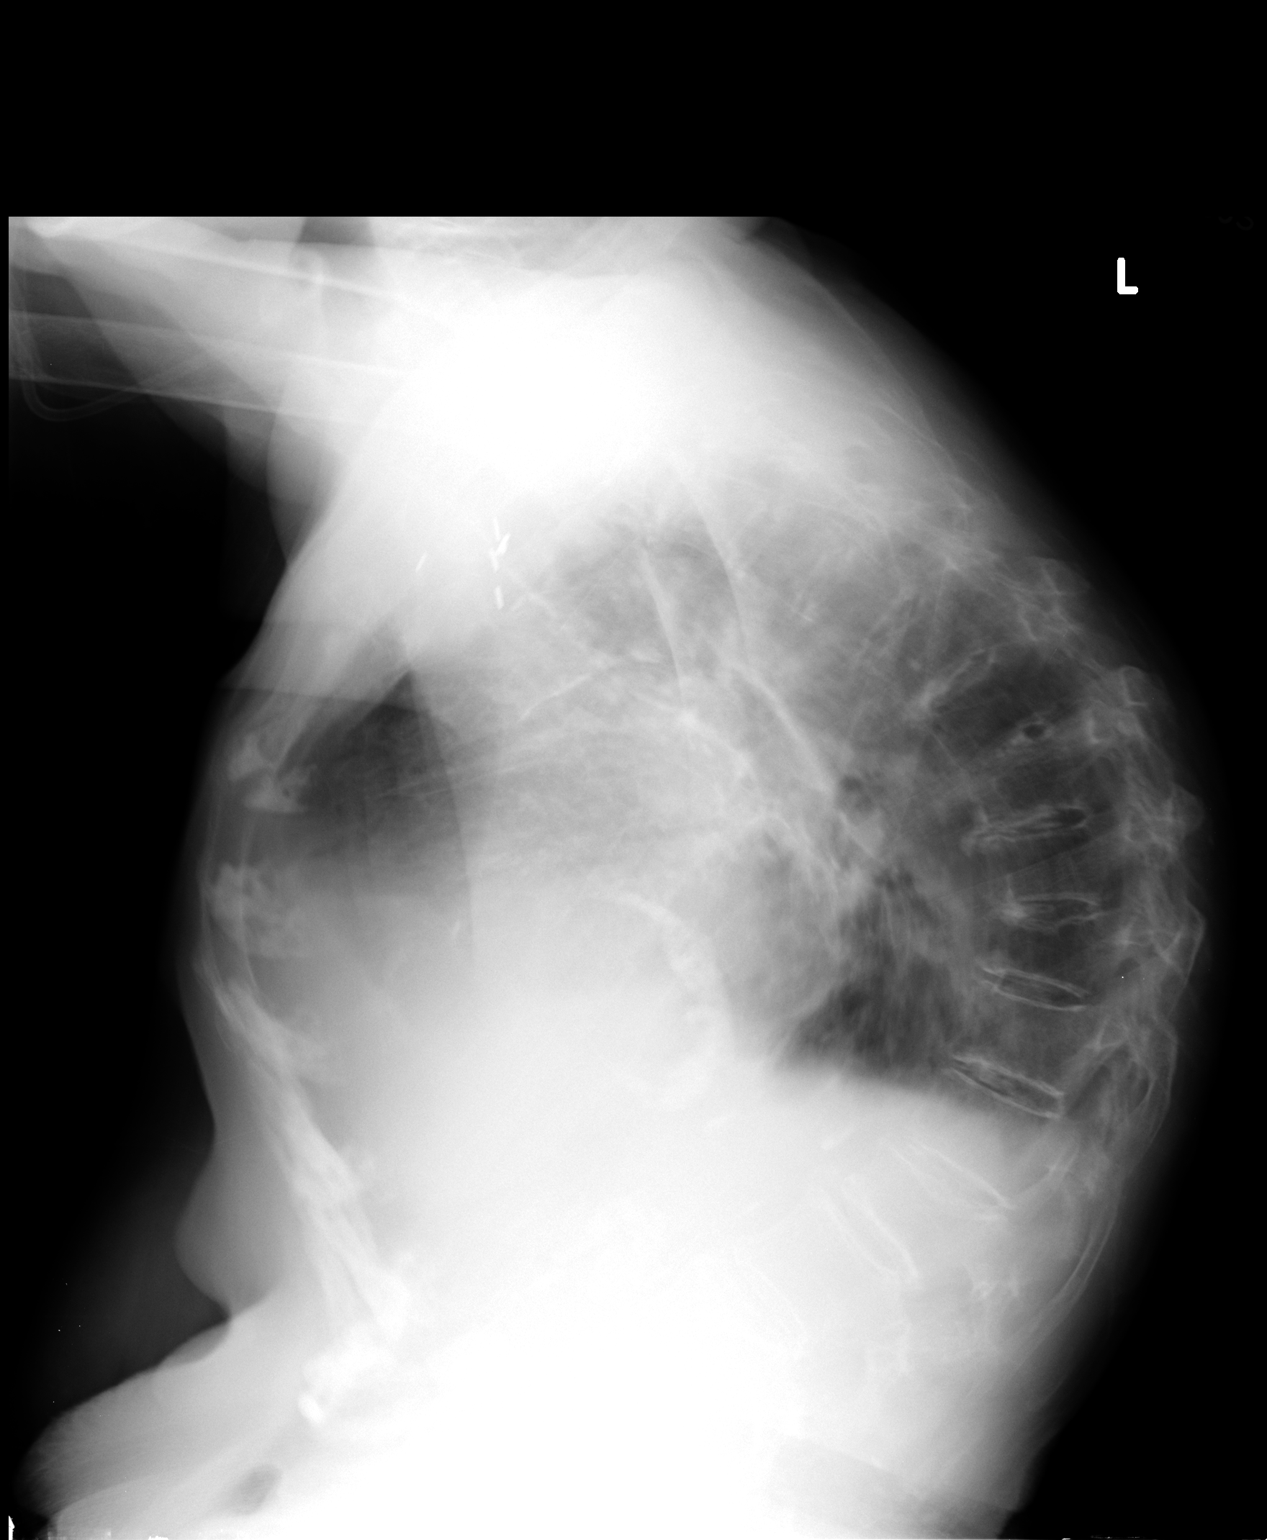

[2 of 2 positions shown; findings below may reference images not displayed]

FINDINGS: There is marked cardiomegaly without edema.  Chronic scar
and volume loss in the right chest is unchanged.  Hiatal hernia is
noted.  Mild, remote compression fractures in the lower thoracic
spine and at the thoracolumbar junction are unchanged.
IMPRESSION: 1.  No acute disease.
2.  Cardiomegaly.

## 2013-06-20 ENCOUNTER — Ambulatory Visit (HOSPITAL_COMMUNITY)
Admission: RE | Admit: 2013-06-20 | Discharge: 2013-06-20 | Disposition: A | Discharge status: Home / Self Care | Payer: Medicare Other | Source: Ambulatory Visit | Attending: Internal Medicine | Admitting: Internal Medicine

## 2013-06-20 ENCOUNTER — Encounter (HOSPITAL_COMMUNITY): Payer: Self-pay

## 2013-06-20 VITALS — BP 112/56 | HR 73 | Wt 111.8 lb

## 2013-06-20 DIAGNOSIS — E785 Hyperlipidemia, unspecified: Secondary | ICD-10-CM | POA: Insufficient documentation

## 2013-06-20 DIAGNOSIS — Z79899 Other long term (current) drug therapy: Secondary | ICD-10-CM | POA: Insufficient documentation

## 2013-06-20 DIAGNOSIS — G609 Hereditary and idiopathic neuropathy, unspecified: Secondary | ICD-10-CM | POA: Insufficient documentation

## 2013-06-20 DIAGNOSIS — I5022 Chronic systolic (congestive) heart failure: Secondary | ICD-10-CM | POA: Insufficient documentation

## 2013-06-20 DIAGNOSIS — M1990 Unspecified osteoarthritis, unspecified site: Secondary | ICD-10-CM | POA: Insufficient documentation

## 2013-06-20 DIAGNOSIS — I2510 Atherosclerotic heart disease of native coronary artery without angina pectoris: Secondary | ICD-10-CM | POA: Insufficient documentation

## 2013-06-20 DIAGNOSIS — Z7982 Long term (current) use of aspirin: Secondary | ICD-10-CM | POA: Insufficient documentation

## 2013-06-20 DIAGNOSIS — K649 Unspecified hemorrhoids: Secondary | ICD-10-CM | POA: Insufficient documentation

## 2013-06-20 DIAGNOSIS — I5032 Chronic diastolic (congestive) heart failure: Secondary | ICD-10-CM

## 2013-06-20 DIAGNOSIS — I509 Heart failure, unspecified: Secondary | ICD-10-CM | POA: Insufficient documentation

## 2013-06-20 DIAGNOSIS — K219 Gastro-esophageal reflux disease without esophagitis: Secondary | ICD-10-CM | POA: Insufficient documentation

## 2013-06-20 DIAGNOSIS — E039 Hypothyroidism, unspecified: Secondary | ICD-10-CM | POA: Insufficient documentation

## 2013-06-20 DIAGNOSIS — R32 Unspecified urinary incontinence: Secondary | ICD-10-CM | POA: Insufficient documentation

## 2013-06-20 DIAGNOSIS — N3289 Other specified disorders of bladder: Secondary | ICD-10-CM | POA: Insufficient documentation

## 2013-06-20 DIAGNOSIS — R0602 Shortness of breath: Secondary | ICD-10-CM | POA: Insufficient documentation

## 2013-06-20 DIAGNOSIS — M810 Age-related osteoporosis without current pathological fracture: Secondary | ICD-10-CM | POA: Insufficient documentation

## 2013-06-20 MED ORDER — POTASSIUM CHLORIDE 40 MEQ/15ML (20%) PO LIQD: 40.0000 meq | Freq: Two times a day (BID) | ORAL | Status: DC

## 2013-06-20 NOTE — Progress Notes (Signed)
Patient ID: [name], female   DOB: 1917-12-31, 79 y.o.   MRN: [id number] Primary Cardiologist: Dr. Clifton James PCP: Dr. Debby Bud   HPI: [name] is a 79 y.o. female with history of diastolic heart failure, severe mitral regurgitation no surgical candidate, HL and hypothyroidism.      Echo 08/28/12: LVEF 55-60%.  Mild AI, Severe MR.  LA Mod to severely dilated.  PFO/small ASD.  PAPP 70 mmHg.    Discharged from [facility] 09/25/12 after being diuresed a few pounds. She was placed on  continuous 2 liters [place].    She returns for follow up with her son and daughter.  Weight at home 114-117 pounds. She usually takes one extra lasix a month.  Denies SOB/PND/Orthopnea. Remains on 2 liters [place] continuously.Ambulates with a walker.  Daughter prepares pill box.    ROS: All systems negative except as listed in HPI, PMH and Problem List.  Past Medical History  Diagnosis Date  . Coronary artery disease   . Peripheral neuropathy   . Aortic insufficiency     mild  . Cerumen impaction   . Unspecified diastolic heart failure   . Hyperlipemia   . Mitral valve prolapse     "severe" (09/22/2012)  . Mitral regurgitation     severe  . Urosepsis   . Anemia, unspecified   . Osteoporosis   . Adenocarcinoma, breast   . Irritable bladder   . Urinary incontinence   . Hypothyroidism   . GERD (gastroesophageal reflux disease)   . H/O: whooping cough   . Hypercholesterolemia     "stopped statins ~ 2 yr ago" (09/22/2012)  . Heart murmur   . CHF (congestive heart failure)   . Shortness of breath     "basically all the time; worse w/exertion" (09/22/2012)  . Bleeding hemorrhoids ~ 2011  . Osteoarthritis     "knees, hips, hands" (09/22/2012)    Current Outpatient Prescriptions  Medication Sig Dispense Refill  . aspirin EC 81 MG tablet Take 81 mg by mouth every morning.      . calcium-vitamin D (OSCAL WITH D) 500-200 MG-UNIT per tablet Take 1 tablet by mouth 3 (three) times daily.       . carvedilol  (COREG) 25 MG tablet Take 1 tablet (25 mg total) by mouth 2 (two) times daily with a meal.  60 tablet  5  . cycloSPORINE (RESTASIS) 0.05 % ophthalmic emulsion Place 1 drop into both eyes 2 (two) times daily.       . furosemide (LASIX) 40 MG tablet Take 60 mg by mouth 2 (two) times daily. Take 60 mg  In am and 40 mg in pm      . Multiple Vitamin (MULTIVITAMIN) tablet Take 1 tablet by mouth daily. Athruz Select 50+      . Multiple Vitamins-Minerals (PRESERVISION AREDS PO) Take 2 tablets by mouth daily.      . naproxen sodium (ANAPROX) 220 MG tablet Take 220 mg by mouth at bedtime as needed. For pain      . omega-3 acid ethyl esters (LOVAZA) 1 G capsule Take 2 g by mouth every evening.      Bertram Gala Glycol-Propyl Glycol (SYSTANE) 0.4-0.3 % GEL Place 1 drop into both eyes at bedtime.      . potassium chloride 40 MEQ/15ML (20%) LIQD Take 15 mLs (40 mEq total) by mouth 2 (two) times daily.  600 mL  3  . sulfamethoxazole-trimethoprim (BACTRIM,SEPTRA) 400-80 MG per tablet Take 1 tablet by mouth every  evening.  30 tablet  5  . guaiFENesin (MUCINEX) 600 MG 12 hr tablet Take 1,200 mg by mouth 2 (two) times daily.      . sodium chloride (OCEAN) 0.65 % nasal spray Place 1 spray into the nose 3 (three) times daily as needed. For dryness       No current facility-administered medications for this encounter.     PHYSICAL EXAM: Filed Vitals:   06/20/13 1418  BP: 112/56  Pulse: 73  Weight: 111 lb 12.8 oz (50.712 kg)  SpO2: 100%    General:  Elderly in wheelchair.  No resp difficulty son and daughter present HEENT: normal Neck: supple. JVP flat. Carotids 2+ bilaterally; no bruits. No lymphadenopathy or thryomegaly appreciated. Cor: s/p L mastectomy. PMI normal. Regular rate & rhythm. 3/6 MR murmur. No rubs, gallops. Lungs: clear on 2 liters [place] Abdomen: soft, nontender, nondistended. No hepatosplenomegaly. No bruits or masses. Good bowel sounds. Extremities: no cyanosis, clubbing, rash, tr  edema Neuro: alert & orientedx3, cranial nerves grossly intact. Moves all 4 extremities w/o difficulty. Affect pleasant.    ASSESSMENT & PLAN: 1. Chronic Diastolic Heart Failure- baseline weight at home 114-117 pounds.  Volume status stable. Continue lasix 60 mg in am and 40 mg in pm Reinforced sliding scale diuretics.    Follow up in 3-4 months.  Julia Christen NP-C 5:28 PM

## 2013-06-20 NOTE — Patient Instructions (Addendum)
Follow up in 3-4 months  Do the following things EVERYDAY: 1) Weigh yourself in the morning before breakfast. Write it down and keep it in a log. 2) Take your medicines as prescribed 3) Eat low salt foods-Limit salt (sodium) to 2000 mg per day.  4) Stay as active as you can everyday 5) Limit all fluids for the day to less than 2 liters 

## 2013-07-20 ENCOUNTER — Other Ambulatory Visit: Payer: Self-pay | Admitting: Internal Medicine

## 2013-08-27 ENCOUNTER — Other Ambulatory Visit (HOSPITAL_COMMUNITY): Payer: Self-pay | Admitting: Internal Medicine

## 2013-09-06 ENCOUNTER — Other Ambulatory Visit: Payer: Self-pay

## 2013-09-20 ENCOUNTER — Other Ambulatory Visit (HOSPITAL_COMMUNITY): Payer: Self-pay | Admitting: *Deleted

## 2013-09-20 MED ORDER — POTASSIUM CHLORIDE 40 MEQ/15ML (20%) PO LIQD: 40.0000 meq | Freq: Two times a day (BID) | ORAL | Status: DC

## 2013-10-17 ENCOUNTER — Ambulatory Visit (HOSPITAL_COMMUNITY)
Admission: RE | Admit: 2013-10-17 | Discharge: 2013-10-17 | Disposition: A | Discharge status: Home / Self Care | Payer: Medicare Other | Source: Ambulatory Visit | Attending: Internal Medicine | Admitting: Internal Medicine

## 2013-10-17 ENCOUNTER — Encounter (HOSPITAL_COMMUNITY): Payer: Self-pay

## 2013-10-17 VITALS — BP 108/50 | HR 76 | Ht 62.0 in | Wt 119.8 lb

## 2013-10-17 DIAGNOSIS — I359 Nonrheumatic aortic valve disorder, unspecified: Secondary | ICD-10-CM

## 2013-10-17 DIAGNOSIS — I5032 Chronic diastolic (congestive) heart failure: Secondary | ICD-10-CM | POA: Insufficient documentation

## 2013-10-17 LAB — BASIC METABOLIC PANEL
BUN: 19 mg/dL (ref 6–23)
Chloride: 98 mEq/L (ref 96–112)
Creatinine, Ser: 1.35 mg/dL — ABNORMAL HIGH (ref 0.50–1.10)
GFR calc Af Amer: 38 mL/min — ABNORMAL LOW (ref >90)
Glucose, Bld: 141 mg/dL — ABNORMAL HIGH (ref 70–99)
Potassium: 4.3 mEq/L (ref 3.5–5.1)

## 2013-10-17 MED ORDER — FUROSEMIDE 40 MG PO TABS: 60.0000 mg | ORAL_TABLET | Freq: Two times a day (BID) | ORAL | Status: DC

## 2013-10-17 NOTE — Patient Instructions (Signed)
Increase your lasix to 60 mg (1/2 tablets) twice a day. You can take 60 mg twice a day or if you would like to take 80 mg in the morning and 40 mg in the evening that is ok.  Will call about lab results.  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  Have a wonderful Christmas and New Years.  F/U 2 months  Call any issues.  Do the following things EVERYDAY: 1) Weigh yourself in the morning before breakfast. Write it down and keep it in a log. 2) Take your medicines as prescribed 3) Eat low salt foods-Limit salt (sodium) to 2000 mg per day.  4) Stay as active as you can everyday 5) Limit all fluids for the day to less than 2 liters 6)

## 2013-10-17 NOTE — Progress Notes (Addendum)
Patient ID: [name], female   DOB: 02/03/1918, 79 y.o.   MRN: [id number] Primary Cardiologist: Dr. Clifton James PCP: Dr. Debby Bud   HPI: Ms. Badertscher is a 79 y.o. female with history of diastolic heart failure, severe mitral regurgitation no surgical candidate, HL and hypothyroidism.      Echo 08/28/12: LVEF 55-60%.  Mild AI, Severe MR.  LA Mod to severely dilated.  PFO/small ASD.  PAPP 70 mmHg.    Discharged from [facility] 09/25/12 after being diuresed a few pounds. She was placed on  continuous 2 liters [place].    Follow up: Doing well. Over the last week SOB has increased more and having more trouble walking from the bedroom to bathroom. Lives with daughter. Weight at home 114-116 lbs. SBP at home 90-115/50s. O2 sats 97-100% on chronic 2L O2. Denies CP, PND or orthopnea. Ambulates with a walker. Following a low salt diet and drinking less that 2 L a day. Scheduled dose of lasix is 60 mg qam and 40 mg q pm, rarely takes extra 20 mg lasix in the evening.   ROS: All systems negative except as listed in HPI, PMH and Problem List.  Past Medical History  Diagnosis Date  . Coronary artery disease   . Peripheral neuropathy   . Aortic insufficiency     mild  . Cerumen impaction   . Unspecified diastolic heart failure   . Hyperlipemia   . Mitral valve prolapse     "severe" (09/22/2012)  . Mitral regurgitation     severe  . Urosepsis   . Anemia, unspecified   . Osteoporosis   . Adenocarcinoma, breast   . Irritable bladder   . Urinary incontinence   . Hypothyroidism   . GERD (gastroesophageal reflux disease)   . H/O: whooping cough   . Hypercholesterolemia     "stopped statins ~ 2 yr ago" (09/22/2012)  . Heart murmur   . CHF (congestive heart failure)   . Shortness of breath     "basically all the time; worse w/exertion" (09/22/2012)  . Bleeding hemorrhoids ~ 2011  . Osteoarthritis     "knees, hips, hands" (09/22/2012)    Current Outpatient Prescriptions  Medication Sig Dispense Refill   . aspirin EC 81 MG tablet Take 81 mg by mouth every morning.      . calcium-vitamin D (OSCAL WITH D) 500-200 MG-UNIT per tablet Take 1 tablet by mouth 3 (three) times daily.       . carvedilol (COREG) 25 MG tablet take 1 tablet by mouth twice a day with meals  60 tablet  5  . cycloSPORINE (RESTASIS) 0.05 % ophthalmic emulsion Place 1 drop into both eyes 2 (two) times daily.       . furosemide (LASIX) 40 MG tablet TAKE 1 AND 1/2 TABLETS (60 MG TOTAL) BY MOUTH 2 TIMES DAILY  90 tablet  11  . guaiFENesin (MUCINEX) 600 MG 12 hr tablet Take 1,200 mg by mouth 2 (two) times daily.      . Multiple Vitamin (MULTIVITAMIN) tablet Take 1 tablet by mouth daily. Athruz Select 50+      . Multiple Vitamins-Minerals (PRESERVISION AREDS PO) Take 2 tablets by mouth daily.      [name] omega-3 acid ethyl esters (LOVAZA) 1 G capsule Take 2 g by mouth every evening.      Bertram Gala Glycol-Propyl Glycol (SYSTANE) 0.4-0.3 % GEL Place 1 drop into both eyes at bedtime.      . Polyvinyl Alcohol-Povidone (FRESHKOTE OP) Apply to  eye as directed.      . potassium chloride 40 MEQ/15ML (20%) LIQD Take 15 mLs (40 mEq total) by mouth 2 (two) times daily.  600 mL  3  . sulfamethoxazole-trimethoprim (BACTRIM,SEPTRA) 400-80 MG per tablet Take 1 tablet by mouth every evening.  30 tablet  5   No current facility-administered medications for this encounter.      Filed Vitals:   10/17/13 1520  BP: 108/50  Pulse: 76  Height: 5\' 2"  (1.575 m)  Weight: 119 lb 12.8 oz (54.341 kg)  SpO2: 99%   PHYSICAL EXAM: General:  Elderly in wheelchair.  No resp difficulty son and daughter present HEENT: normal Neck: supple. JVP 9 with prominent CV waves. Carotids 2+ bilaterally; no bruits. No lymphadenopathy or thryomegaly appreciated. Cor: s/p L mastectomy. PMI normal. Regular rate & rhythm. 3/6 MR murmur. No rubs, gallops. Lungs: clear on 2 liters [place] Abdomen: soft, nontender, nondistended. No hepatosplenomegaly. No bruits or masses. Good  bowel sounds. Extremities: no cyanosis, clubbing, rash, tr edema Neuro: alert & orientedx3, cranial nerves grossly intact. Moves all 4 extremities w/o difficulty. Affect pleasant.    ASSESSMENT & PLAN:  1. Chronic Diastolic Heart Failure: - NYHA III symptoms. Patient reports her weight has been at baseline for the past few weeks 114-117 lbs, however her DOE has increased. She can only go short distances about 20-50 ft before getting winded. Will increase lasix to 60 mg BID. Explained to the daughter since she has to get up frequently to urinate and she lives with her that she can try 60 mg BID with afternoon dose around 4:00 pm or she can try 80 mg q am and 40 mg q pm. - Will continue potassium dose currently and check BMET today along with pro-BNP With increase in diuretics will also check pro-BNP and BMET in 2 weeks.  -Reinforced the need and importance of daily weights, a low sodium diet, and fluid restriction (less than 2 L a day). Instructed to call the HF clinic if weight increases more than 3 lbs overnight or 5 lbs in a week.  - Overall she looks pretty good and still has a great QOL and exercises as much as she can at home. She has great family support. Have offered HHRN, PT, OT services, however at this time think she is doing pretty well and that they are able to manage her care.  2. AS - Was mild on last ECHO 08/2012. Increased DOE could be related to worsening AS, however would not change plan of care. She is not a surgical candidate d/t age. Main issue is managing her fluid status.    F/U 2 months  Ulla Potash B NP-C 3:24 PM

## 2013-11-02 ENCOUNTER — Ambulatory Visit: Payer: Medicare Other | Admitting: *Deleted

## 2013-11-02 DIAGNOSIS — I5032 Chronic diastolic (congestive) heart failure: Secondary | ICD-10-CM

## 2013-11-02 LAB — BASIC METABOLIC PANEL
BUN: 16 mg/dL (ref 6–23)
CO2: 31 mEq/L (ref 19–32)
Calcium: 9.4 mg/dL (ref 8.4–10.5)
Chloride: 95 mEq/L — ABNORMAL LOW (ref 96–112)
Creat: 1.41 mg/dL — ABNORMAL HIGH (ref 0.50–1.10)
GLUCOSE: 117 mg/dL — AB (ref 70–99)
POTASSIUM: 4.4 meq/L (ref 3.5–5.3)
Sodium: 134 mEq/L — ABNORMAL LOW (ref 135–145)

## 2013-12-21 ENCOUNTER — Telehealth (HOSPITAL_COMMUNITY): Payer: Self-pay | Admitting: Cardiology

## 2013-12-21 MED ORDER — METOLAZONE 2.5 MG PO TABS: ORAL_TABLET | ORAL | Status: DC

## 2013-12-21 NOTE — Telephone Encounter (Signed)
Pt's daughter had called office earlier, no return call. Her mother has had increasing SOB and edema last several days. She is not uncomfortable at rest. Her Lasix had recently been increased without improvement. I ordered Zaroxolyn 2.5 mg daily x 2 days. She'll call Monday with an update. She may need to be on 2-3 x week Zaroxolyn if this helps.   Kerin Ransom PA-C 12/21/2013 5:25 PM

## 2013-12-21 NOTE — Telephone Encounter (Signed)
Daughter is concerned with increased SOB with walking, talking, activity,laughing, has a mild cough with mucus Increased LE edema Given extra Lasix for a few days no change in symptoms 2/17 115.8 2/20 116.2

## 2013-12-24 ENCOUNTER — Other Ambulatory Visit: Payer: Self-pay

## 2013-12-24 ENCOUNTER — Ambulatory Visit (HOSPITAL_BASED_OUTPATIENT_CLINIC_OR_DEPARTMENT_OTHER)
Admission: RE | Admit: 2013-12-24 | Discharge: 2013-12-24 | Disposition: A | Discharge status: Home / Self Care | Payer: Medicare Other | Source: Ambulatory Visit | Attending: Internal Medicine | Admitting: Internal Medicine

## 2013-12-24 ENCOUNTER — Encounter (HOSPITAL_COMMUNITY): Payer: Self-pay | Admitting: Family Medicine

## 2013-12-24 ENCOUNTER — Inpatient Hospital Stay (HOSPITAL_COMMUNITY)
Admission: AD | Admit: 2013-12-24 | Discharge: 2013-12-26 | DRG: 308 | Disposition: A | Discharge status: Home / Self Care | Payer: Medicare Other | Source: Ambulatory Visit | Attending: Internal Medicine | Admitting: Internal Medicine

## 2013-12-24 ENCOUNTER — Other Ambulatory Visit (HOSPITAL_COMMUNITY): Payer: Self-pay | Admitting: Cardiology

## 2013-12-24 VITALS — BP 88/46 | HR 115 | Wt 112.5 lb

## 2013-12-24 DIAGNOSIS — I359 Nonrheumatic aortic valve disorder, unspecified: Secondary | ICD-10-CM | POA: Diagnosis present

## 2013-12-24 DIAGNOSIS — E785 Hyperlipidemia, unspecified: Secondary | ICD-10-CM | POA: Diagnosis present

## 2013-12-24 DIAGNOSIS — I4891 Unspecified atrial fibrillation: Secondary | ICD-10-CM

## 2013-12-24 DIAGNOSIS — I5032 Chronic diastolic (congestive) heart failure: Secondary | ICD-10-CM

## 2013-12-24 DIAGNOSIS — I5033 Acute on chronic diastolic (congestive) heart failure: Secondary | ICD-10-CM | POA: Diagnosis not present

## 2013-12-24 DIAGNOSIS — I080 Rheumatic disorders of both mitral and aortic valves: Secondary | ICD-10-CM

## 2013-12-24 DIAGNOSIS — M810 Age-related osteoporosis without current pathological fracture: Secondary | ICD-10-CM | POA: Diagnosis present

## 2013-12-24 DIAGNOSIS — N184 Chronic kidney disease, stage 4 (severe): Secondary | ICD-10-CM | POA: Diagnosis present

## 2013-12-24 DIAGNOSIS — E875 Hyperkalemia: Secondary | ICD-10-CM | POA: Diagnosis present

## 2013-12-24 DIAGNOSIS — I4510 Unspecified right bundle-branch block: Secondary | ICD-10-CM | POA: Diagnosis present

## 2013-12-24 DIAGNOSIS — I2510 Atherosclerotic heart disease of native coronary artery without angina pectoris: Secondary | ICD-10-CM | POA: Diagnosis present

## 2013-12-24 DIAGNOSIS — Z79899 Other long term (current) drug therapy: Secondary | ICD-10-CM

## 2013-12-24 DIAGNOSIS — I509 Heart failure, unspecified: Secondary | ICD-10-CM | POA: Diagnosis present

## 2013-12-24 DIAGNOSIS — Z853 Personal history of malignant neoplasm of breast: Secondary | ICD-10-CM

## 2013-12-24 DIAGNOSIS — J9610 Chronic respiratory failure, unspecified whether with hypoxia or hypercapnia: Secondary | ICD-10-CM | POA: Diagnosis present

## 2013-12-24 DIAGNOSIS — J9620 Acute and chronic respiratory failure, unspecified whether with hypoxia or hypercapnia: Secondary | ICD-10-CM | POA: Diagnosis not present

## 2013-12-24 DIAGNOSIS — I059 Rheumatic mitral valve disease, unspecified: Secondary | ICD-10-CM | POA: Diagnosis present

## 2013-12-24 DIAGNOSIS — D649 Anemia, unspecified: Secondary | ICD-10-CM | POA: Diagnosis present

## 2013-12-24 DIAGNOSIS — N179 Acute kidney failure, unspecified: Secondary | ICD-10-CM | POA: Diagnosis not present

## 2013-12-24 DIAGNOSIS — E039 Hypothyroidism, unspecified: Secondary | ICD-10-CM | POA: Diagnosis present

## 2013-12-24 DIAGNOSIS — E876 Hypokalemia: Secondary | ICD-10-CM | POA: Diagnosis not present

## 2013-12-24 DIAGNOSIS — Z8249 Family history of ischemic heart disease and other diseases of the circulatory system: Secondary | ICD-10-CM

## 2013-12-24 LAB — COMPREHENSIVE METABOLIC PANEL
ALK PHOS: 80 U/L (ref 39–117)
ALT: 18 U/L (ref 0–35)
AST: 30 U/L (ref 0–37)
Albumin: 3.3 g/dL — ABNORMAL LOW (ref 3.5–5.2)
BILIRUBIN TOTAL: 0.5 mg/dL (ref 0.3–1.2)
BUN: 27 mg/dL — AB (ref 6–23)
CHLORIDE: 90 meq/L — AB (ref 96–112)
CO2: 35 mEq/L — ABNORMAL HIGH (ref 19–32)
Calcium: 9.7 mg/dL (ref 8.4–10.5)
Creatinine, Ser: 1.19 mg/dL — ABNORMAL HIGH (ref 0.50–1.10)
GFR calc Af Amer: 44 mL/min — ABNORMAL LOW (ref >90)
GFR, EST NON AFRICAN AMERICAN: 38 mL/min — AB (ref >90)
Glucose, Bld: 140 mg/dL — ABNORMAL HIGH (ref 70–99)
Potassium: 2.7 mEq/L — CL (ref 3.7–5.3)
Sodium: 140 mEq/L (ref 137–147)
TOTAL PROTEIN: 7.8 g/dL (ref 6.0–8.3)

## 2013-12-24 LAB — CBC WITH DIFFERENTIAL/PLATELET
Basophils Absolute: 0 10*3/uL (ref 0.0–0.1)
Basophils Relative: 0 % (ref 0–1)
EOS ABS: 0.2 10*3/uL (ref 0.0–0.7)
Eosinophils Relative: 2 % (ref 0–5)
HCT: 33.2 % — ABNORMAL LOW (ref 36.0–46.0)
HEMOGLOBIN: 10.7 g/dL — AB (ref 12.0–15.0)
LYMPHS ABS: 2 10*3/uL (ref 0.7–4.0)
Lymphocytes Relative: 28 % (ref 12–46)
MCH: 30.4 pg (ref 26.0–34.0)
MCHC: 32.2 g/dL (ref 30.0–36.0)
MCV: 94.3 fL (ref 78.0–100.0)
MONOS PCT: 11 % (ref 3–12)
Monocytes Absolute: 0.8 10*3/uL (ref 0.1–1.0)
Neutro Abs: 4.3 10*3/uL (ref 1.7–7.7)
Neutrophils Relative %: 59 % (ref 43–77)
Platelets: 158 10*3/uL (ref 150–400)
RBC: 3.52 MIL/uL — ABNORMAL LOW (ref 3.87–5.11)
RDW: 14.7 % (ref 11.5–15.5)
WBC: 7.4 10*3/uL (ref 4.0–10.5)

## 2013-12-24 LAB — MAGNESIUM: MAGNESIUM: 2.2 mg/dL (ref 1.5–2.5)

## 2013-12-24 LAB — PRO B NATRIURETIC PEPTIDE: Pro B Natriuretic peptide (BNP): 11373 pg/mL — ABNORMAL HIGH (ref 0–450)

## 2013-12-24 MED ORDER — POTASSIUM CHLORIDE 20 MEQ/15ML (10%) PO LIQD
40.0000 meq | Freq: Once | ORAL | Status: AC
Start: 2013-12-24 — End: 2013-12-24
  Administered 2013-12-24: 40 meq via ORAL
  Filled 2013-12-24: qty 30

## 2013-12-24 MED ORDER — POTASSIUM CHLORIDE 20 MEQ/15ML (10%) PO LIQD
40.0000 meq | Freq: Two times a day (BID) | ORAL | Status: DC
  Administered 2013-12-24 – 2013-12-26 (×4): 40 meq via ORAL
  Filled 2013-12-24 (×5): qty 30

## 2013-12-24 MED ORDER — CYCLOSPORINE 0.05 % OP EMUL
1.0000 [drp] | Freq: Two times a day (BID) | OPHTHALMIC | Status: DC
  Administered 2013-12-24 – 2013-12-26 (×4): 1 [drp] via OPHTHALMIC
  Filled 2013-12-24 (×5): qty 1

## 2013-12-24 MED ORDER — FUROSEMIDE 40 MG PO TABS
60.0000 mg | ORAL_TABLET | Freq: Two times a day (BID) | ORAL | Status: DC
  Administered 2013-12-24 – 2013-12-25 (×2): 60 mg via ORAL
  Filled 2013-12-24 (×4): qty 1

## 2013-12-24 MED ORDER — AMIODARONE LOAD VIA INFUSION
150.0000 mg | Freq: Once | INTRAVENOUS | Status: AC
  Administered 2013-12-24: 150 mg via INTRAVENOUS
  Filled 2013-12-24: qty 83.34

## 2013-12-24 MED ORDER — APIXABAN 2.5 MG PO TABS
2.5000 mg | ORAL_TABLET | Freq: Two times a day (BID) | ORAL | Status: DC
  Administered 2013-12-24 – 2013-12-26 (×4): 2.5 mg via ORAL
  Filled 2013-12-24 (×5): qty 1

## 2013-12-24 MED ORDER — CARVEDILOL 25 MG PO TABS
25.0000 mg | ORAL_TABLET | Freq: Two times a day (BID) | ORAL | Status: DC
  Filled 2013-12-24 (×3): qty 1

## 2013-12-24 MED ORDER — ONDANSETRON HCL 4 MG/2ML IJ SOLN
4.0000 mg | Freq: Four times a day (QID) | INTRAMUSCULAR | Status: DC | PRN
  Filled 2013-12-24: qty 2

## 2013-12-24 MED ORDER — SODIUM CHLORIDE 0.9 % IJ SOLN
3.0000 mL | Freq: Two times a day (BID) | INTRAMUSCULAR | Status: DC
  Administered 2013-12-24 – 2013-12-26 (×4): 3 mL via INTRAVENOUS

## 2013-12-24 MED ORDER — SODIUM CHLORIDE 0.9 % IJ SOLN: 3.0000 mL | INTRAMUSCULAR | Status: DC | PRN

## 2013-12-24 MED ORDER — AMIODARONE HCL IN DEXTROSE 360-4.14 MG/200ML-% IV SOLN
30.0000 mg/h | INTRAVENOUS | Status: AC
  Administered 2013-12-24: 30 mg/h via INTRAVENOUS
  Filled 2013-12-24: qty 200

## 2013-12-24 MED ORDER — ACETAMINOPHEN 325 MG PO TABS: 650.0000 mg | ORAL_TABLET | ORAL | Status: DC | PRN

## 2013-12-24 MED ORDER — ASPIRIN EC 81 MG PO TBEC
81.0000 mg | DELAYED_RELEASE_TABLET | Freq: Every day | ORAL | Status: DC
  Filled 2013-12-24: qty 1

## 2013-12-24 MED ORDER — SODIUM CHLORIDE 0.9 % IV SOLN: 250.0000 mL | INTRAVENOUS | Status: DC | PRN

## 2013-12-24 NOTE — Addendum Note (Signed)
Encounter addended by: Renee Pain, RN on: 12/24/2013  4:22 PM<BR>     Documentation filed: Notes Section

## 2013-12-24 NOTE — Patient Instructions (Addendum)
Pt will be admitted from CHF clinic today Cpt code [id number] icd 9 427.31, 428.33 With pts current insurance medicare a and b, BCBS Supplement NO PRE CERT IS REQUIRED

## 2013-12-24 NOTE — Progress Notes (Signed)
PIV started in RAC 20g, great blood return, flushed and clamped.  1 attempt, pt tolerated well. Renee Pain

## 2013-12-24 NOTE — Progress Notes (Signed)
Patient ID: [name], female   DOB: Mar 03, 1918, 80 y.o.   MRN: [id number]  Primary Cardiologist: Dr. Angelena Form PCP: Dr. Linda Hedges   HPI: [name] is a 80 y.o. female with history of diastolic heart failure, severe mitral regurgitation no surgical candidate, HL and hypothyroidism.      Echo 08/28/12: LVEF 55-60%.  Mild AI, Severe MR.  LA Mod to severely dilated.  PFO/small ASD.  PAPP 70 mmHg.    Discharged from [facility] 09/25/12 after being diuresed a few pounds. She was placed on  continuous 2 liters [place].    Acute visit: Over the past week has been more SOB and LE edema. Called the on-call PA over the weekend. She was given metolazone 2.5 mg for 2 days. Weight is down 5 lbs. SBP at home 90-100s and pulse 70-80s. However today felt weaker. Took HR this am and noted it to be 124 so came to Clinic. This is first high HR recorded (she checks twice a day). Denies palpitations, dizziness, dyspnea. Weight 5 pounds below baseline. ECG in clinic with new onset AF at 122.  ROS: All systems negative except as listed in HPI, PMH and Problem List.  Past Medical History  Diagnosis Date  . Coronary artery disease   . Peripheral neuropathy   . Aortic insufficiency     mild  . Cerumen impaction   . Unspecified diastolic heart failure   . Hyperlipemia   . Mitral valve prolapse     "severe" (09/22/2012)  . Mitral regurgitation     severe  . Urosepsis   . Anemia, unspecified   . Osteoporosis   . Adenocarcinoma, breast   . Irritable bladder   . Urinary incontinence   . Hypothyroidism   . GERD (gastroesophageal reflux disease)   . H/O: whooping cough   . Hypercholesterolemia     "stopped statins ~ 2 yr ago" (09/22/2012)  . Heart murmur   . CHF (congestive heart failure)   . Shortness of breath     "basically all the time; worse w/exertion" (09/22/2012)  . Bleeding hemorrhoids ~ 2011  . Osteoarthritis     "knees, hips, hands" (09/22/2012)    Current Outpatient Prescriptions  Medication Sig  Dispense Refill  . aspirin EC 81 MG tablet Take 81 mg by mouth every morning.      . calcium-vitamin D (OSCAL WITH D) 500-200 MG-UNIT per tablet Take 1 tablet by mouth 3 (three) times daily.       . carvedilol (COREG) 25 MG tablet take 1 tablet by mouth twice a day with meals  60 tablet  5  . cycloSPORINE (RESTASIS) 0.05 % ophthalmic emulsion Place 1 drop into both eyes 2 (two) times daily.       . furosemide (LASIX) 40 MG tablet Take 1.5 tablets (60 mg total) by mouth 2 (two) times daily.  270 tablet  3  . guaiFENesin (MUCINEX) 600 MG 12 hr tablet Take 1,200 mg by mouth 2 (two) times daily.      . metolazone (ZAROXOLYN) 2.5 MG tablet Take one tablet 30 minutes before Lasix on Saturday and Sunday, call office Monday  10 tablet  1  . Multiple Vitamin (MULTIVITAMIN) tablet Take 1 tablet by mouth daily. Athruz Select 50+      . Multiple Vitamins-Minerals (PRESERVISION AREDS PO) Take 2 tablets by mouth daily.      [name] omega-3 acid ethyl esters (LOVAZA) 1 G capsule Take 2 g by mouth every evening.      [name]  Polyethyl Glycol-Propyl Glycol (SYSTANE) 0.4-0.3 % GEL Place 1 drop into both eyes at bedtime.      . Polyvinyl Alcohol-Povidone ([place] OP) Apply to eye as directed.      . potassium chloride 40 MEQ/15ML (20%) LIQD Take 15 mLs (40 mEq total) by mouth 2 (two) times daily.  600 mL  3  . sulfamethoxazole-trimethoprim (BACTRIM,SEPTRA) 400-80 MG per tablet Take 1 tablet by mouth every evening.  30 tablet  5   No current facility-administered medications for this encounter.    Filed Vitals:   12/24/13 1427  BP: 88/46  Pulse: 115  Weight: 112 lb 8 oz (51.03 kg)  SpO2: 98%   PHYSICAL EXAM: General:  Elderly in wheelchair.  No resp difficulty son and daughter present HEENT: normal Neck: supple. JVP 8 with prominent CV waves. Carotids 2+ bilaterally; no bruits. No lymphadenopathy or thryomegaly appreciated. Cor: s/p L mastectomy. PMI normal. irregular rate & irregular rhythm. 3/6 MR murmur. No rubs,  gallops. Lungs: clear on 2 liters [place] Abdomen: soft, nontender, nondistended. No hepatosplenomegaly. No bruits or masses. Good bowel sounds. Extremities: no cyanosis, clubbing, rash, tr edema Neuro: alert & orientedx3, cranial nerves grossly intact. Moves all 4 extremities w/o difficulty. Affect pleasant.  EKG: Afib with RVR 122 bpm  ASSESSMENT & PLAN:  1. AF with RVR - her rate is fast and BP is soft. Given severe MR and diastolic HF will likely not tolerate AF. Will admit to SDU. Start IV amio and Eliquis. Possible DCCV tomorrow. (She has meticulous monitoring of HRs and this is the first time it has been elevated so suspect duration of AF < 12 hours)  2. Chronic Diastolic Heart Failure: Volume status mildly elevated in setting of new-onset AF with RVR. Admit for amio and probable DC-CV. Continue outpatient diuretic regimen.   [name] NP-C 2:44 PM   Patient seen and examined with Junie Bame, NP. We discussed all aspects of the encounter. I agree with the assessment and plan as stated above. I have edited note as above. Will admit for amio, eliquis and DC-CV.   We discussed code status. Ok with intubation and DC-CV. No CPR.   Benay Spice 3:46 PM

## 2013-12-24 NOTE — Progress Notes (Signed)
Pt arrived from heart failure clinic-VS at baseline aside from HR afib in 120-130's, SBP 88-94.  2L [place]-home O2 dependent sats 100%. Within 30 minutes of arrive pt converted to NSR 65-79, performed 12 lead to verify and notified NP. Will continue with loading dose and continuos gtt of amio-note to give loading dose slowly.   Daughter at bedside to help with history and is staying overnight. Report provided to on coming RN, Gwyndolyn Saxon.  Will continue to monitor.

## 2013-12-24 NOTE — Progress Notes (Signed)
Pt K is 2.7. Dr. Aundra Dubin on call notified. Order given for extra 77meq of K. Will give and continue to monitor.

## 2013-12-24 NOTE — H&P (Signed)
HPI: [name] is a 80 y.o. female with history of diastolic heart failure, severe mitral regurgitation no surgical candidate, HL and hypothyroidism.   Echo 08/28/12: LVEF 55-60%. Mild AI, Severe MR. LA Mod to severely dilated. PFO/small ASD. PAPP 70 mmHg.   Discharged from [facility] 09/25/12 after being diuresed a few pounds. She was placed on continuous 2 liters [place].   Over the past week has been more SOB and LE edema. Called the on-call PA over the weekend. She was given metolazone 2.5 mg for 2 days. Weight is down 5 lbs. SBP at home 90-100s and pulse 70-80s. However today felt weaker. Took HR this am and noted it to be 124 so came to Clinic. This is first high HR recorded (she checks twice a day). Denies palpitations, dizziness, dyspnea. Weight 5 pounds below baseline. ECG in clinic with new onset AF at 122. Admitted for treatment of AF with RVR   Review of Systems:     Cardiac Review of Systems: {Y] = yes [ ]  = no  Chest Pain [    ]  Resting SOB [   ] Exertional SOB  [ Y ]  Orthopnea [  ]   Pedal Edema [   ]    Palpitations [  ] Syncope  [  ]   Presyncope [   ]  General Review of Systems: [Y] = yes [  ]=no Constitional: recent weight change [  ]; anorexia [  ]; fatigue [ Y ]; nausea [  ]; night sweats [  ]; fever [  ]; or chills [  ];                                                                                                                                          Dental: poor dentition[  ]; Last Dentist visit:   Eye : blurred vision [  ]; diplopia [   ]; vision changes [  ];  Amaurosis fugax[  ]; Resp: cough [  ];  wheezing[  ];  hemoptysis[  ]; shortness of breath[  ]; paroxysmal nocturnal dyspnea[  ]; dyspnea on exertion[ Y ]; or orthopnea[  ];  GI:  gallstones[  ], vomiting[  ];  dysphagia[  ]; melena[  ];  hematochezia [  ]; heartburn[  ];   Hx of  Colonoscopy[  ]; GU: kidney stones [  ]; hematuria[  ];   dysuria [  ];  nocturia[  ];  history of     obstruction [  ];        Skin: rash, swelling[  ];, hair loss[  ];  peripheral edema[  ];  or itching[  ]; Musculosketetal: myalgias[  ];  joint swelling[  ];  joint erythema[  ];  joint pain[  ];  back pain[  ];  Heme/Lymph: bruising[  ];  bleeding[  ];  anemia[  ];  Neuro: TIA[  ];  headaches[  ];  stroke[  ];  vertigo[  ];  seizures[  ];   paresthesias[  ];  difficulty walking[  ];  Psych:depression[  ]; anxiety[  ];  Endocrine: diabetes[  ];  thyroid dysfunction[Y ];  Immunizations: Flu [  ]; Pneumococcal[  ];  Other:  Past Medical History  Diagnosis Date  . Coronary artery disease   . Peripheral neuropathy   . Aortic insufficiency     mild  . Cerumen impaction   . Unspecified diastolic heart failure   . Hyperlipemia   . Mitral valve prolapse     "severe" (09/22/2012)  . Mitral regurgitation     severe  . Urosepsis   . Anemia, unspecified   . Osteoporosis   . Adenocarcinoma, breast   . Irritable bladder   . Urinary incontinence   . Hypothyroidism   . GERD (gastroesophageal reflux disease)   . H/O: whooping cough   . Hypercholesterolemia     "stopped statins ~ 2 yr ago" (09/22/2012)  . Heart murmur   . CHF (congestive heart failure)   . Shortness of breath     "basically all the time; worse w/exertion" (09/22/2012)  . Bleeding hemorrhoids ~ 2010-02-17  . Osteoarthritis     "knees, hips, hands" (09/22/2012)    No prescriptions prior to admission     No Known Allergies  History   Social History  . Marital Status: Married    Spouse Name: N/A    Number of Children: N/A  . Years of Education: N/A   Occupational History  . Not on file.   Social History Main Topics  . Smoking status: Never Smoker   . Smokeless tobacco: Never Used  . Alcohol Use: No  . Drug Use: No  . Sexual Activity: No   Other Topics Concern  . Not on file   Social History Narrative   HSG   Married- '39-'10   2 daughters- 1 died at birth, '57; 84 brother 2043-02-18, 2 grandchildren and 2 great grands   Lives  with daughter   End of life care:- has a living will- DNR/DNI, no heroic measures   Tobacco Use-no   Alcohol Use- no   Drug use-no    Family History  Problem Relation Age of Onset  . Heart disease Mother   . Cancer Mother     breast cancer  . Stroke Father   . Heart disease Father   . Heart disease Sister   . Alzheimer's disease Sister   . Heart disease Sister   . Heart disease Brother     PHYSICAL EXAM: There were no vitals filed for this visit. General: Elderly in wheelchair. No resp difficulty son and daughter present  HEENT: normal  Neck: supple. JVP 8 with prominent CV waves. Carotids 2+ bilaterally; no bruits. No lymphadenopathy or thryomegaly appreciated.  Cor: s/p L mastectomy. PMI normal. irregular rate & irregular rhythm. 3/6 MR murmur. No rubs, gallops.  Lungs: clear on 2 liters [place]  Abdomen: soft, nontender, nondistended. No hepatosplenomegaly. No bruits or masses. Good bowel sounds.  Extremities: no cyanosis, clubbing, rash, tr edema  Neuro: alert & orientedx3, cranial nerves grossly intact. Moves all 4 extremities w/o difficulty. Affect pleasant.    ECG: A-fib 122   No results found for this or any previous visit (from the past 24 hour(s)). No results found.   ASSESSMENT: 1. New Onset A-Fib with RVR 2. Chronic Diastolic Heart Failure  3. Hypothyroid 4. Limited Code---> No CPR   PLAN/DISCUSSION: [name] is a 33 year admitted today from the HF clinic with new onset A Fib. Give amiodarone 150 mg bolus and start amiodarone drip. Start eliquis 2.5 mg twice a day. Plan to DC-CV if she does not chemically convert. Check CMET, CBC, TSH.   From volume status she is stable. Will continue current oral diuretics.   Code status discussed and she request no CPR but pursue all other therapies as needed.   CLEGG,AMY NP-C 4:17 PM  Patient seen and examined with Darrick Grinder, NP. We discussed all aspects of the encounter. I agree with the assessment and plan as  stated above.   I suspect she has been in and out of AF. Given diastolic HF and severe MR will likely not tolerate well. Will admit for amio, eliquis and DC-CV, as needed. Volume status ok currently.   We discussed code status. Ok with intubation and DC-CV. No CPR.   Daniel Bensimhon,MD 10:54 AM

## 2013-12-24 NOTE — Progress Notes (Signed)
  Amiodarone Drug - Drug Interaction Consult Note  Recommendations: Current medications reviewed - no major drug/drug interacting medications noted.  Would continue current plan for now.  Amiodarone is metabolized by the cytochrome P450 system and therefore has the potential to cause many drug interactions. Amiodarone has an average plasma half-life of 50 days (range 20 to 100 days).   There is potential for drug interactions to occur several weeks or months after stopping treatment and the onset of drug interactions may be slow after initiating amiodarone.   []  Statins: Increased risk of myopathy. Simvastatin- restrict dose to 20mg  daily. Other statins: counsel patients to report any muscle pain or weakness immediately.  []  Anticoagulants: Amiodarone can increase anticoagulant effect. Consider warfarin dose reduction. Patients should be monitored closely and the dose of anticoagulant altered accordingly, remembering that amiodarone levels take several weeks to stabilize.  []  Antiepileptics: Amiodarone can increase plasma concentration of phenytoin, the dose should be reduced. Note that small changes in phenytoin dose can result in large changes in levels. Monitor patient and counsel on signs of toxicity.  []  Beta blockers: increased risk of bradycardia, AV block and myocardial depression. Sotalol - avoid concomitant use.  []   Calcium channel blockers (diltiazem and verapamil): increased risk of bradycardia, AV block and myocardial depression.  []   Cyclosporine: Amiodarone increases levels of cyclosporine. Reduced dose of cyclosporine is recommended.  []  Digoxin dose should be halved when amiodarone is started.  []  Diuretics: increased risk of cardiotoxicity if hypokalemia occurs.  []  Oral hypoglycemic agents (glyburide, glipizide, glimepiride): increased risk of hypoglycemia. Patient's glucose levels should be monitored closely when initiating amiodarone therapy.   []  Drugs that prolong  the QT interval:  Torsades de pointes risk may be increased with concurrent use - avoid if possible.  Monitor QTc, also keep magnesium/potassium WNL if concurrent therapy can't be avoided. [name] Antibiotics: e.g. fluoroquinolones, erythromycin. . Antiarrhythmics: e.g. quinidine, procainamide, disopyramide, sotalol. . Antipsychotics: e.g. phenothiazines, haloperidol.  . Lithium, tricyclic antidepressants, and methadone. Thank You,   Rober Minion, PharmD., MS Clinical Pharmacist Pager:  [phone] Thank you for allowing pharmacy to be part of this patients care team. 12/24/2013 6:54 PM

## 2013-12-25 ENCOUNTER — Inpatient Hospital Stay (HOSPITAL_COMMUNITY): Payer: Medicare Other

## 2013-12-25 DIAGNOSIS — I059 Rheumatic mitral valve disease, unspecified: Secondary | ICD-10-CM

## 2013-12-25 DIAGNOSIS — I5033 Acute on chronic diastolic (congestive) heart failure: Secondary | ICD-10-CM

## 2013-12-25 DIAGNOSIS — I509 Heart failure, unspecified: Secondary | ICD-10-CM

## 2013-12-25 LAB — BASIC METABOLIC PANEL
BUN: 33 mg/dL — ABNORMAL HIGH (ref 6–23)
CO2: 32 meq/L (ref 19–32)
Calcium: 9.6 mg/dL (ref 8.4–10.5)
Chloride: 93 mEq/L — ABNORMAL LOW (ref 96–112)
Creatinine, Ser: 1.46 mg/dL — ABNORMAL HIGH (ref 0.50–1.10)
GFR calc Af Amer: 35 mL/min — ABNORMAL LOW (ref >90)
GFR, EST NON AFRICAN AMERICAN: 30 mL/min — AB (ref >90)
GLUCOSE: 190 mg/dL — AB (ref 70–99)
POTASSIUM: 4.8 meq/L (ref 3.7–5.3)
SODIUM: 138 meq/L (ref 137–147)

## 2013-12-25 LAB — TSH: TSH: 4.604 u[IU]/mL — ABNORMAL HIGH (ref 0.350–4.500)

## 2013-12-25 LAB — MRSA PCR SCREENING: MRSA by PCR: NEGATIVE

## 2013-12-25 MED ORDER — ALPRAZOLAM 0.25 MG PO TABS
0.2500 mg | ORAL_TABLET | Freq: Every day | ORAL | Status: DC | PRN
  Administered 2013-12-25: 0.25 mg via ORAL
  Filled 2013-12-25: qty 1

## 2013-12-25 MED ORDER — FUROSEMIDE 10 MG/ML IJ SOLN
INTRAMUSCULAR | Status: AC
  Administered 2013-12-25: 60 mg
  Filled 2013-12-25: qty 8

## 2013-12-25 MED ORDER — CARVEDILOL 6.25 MG PO TABS
6.2500 mg | ORAL_TABLET | Freq: Two times a day (BID) | ORAL | Status: DC
  Administered 2013-12-25 – 2013-12-26 (×2): 6.25 mg via ORAL
  Filled 2013-12-25 (×4): qty 1

## 2013-12-25 MED ORDER — FUROSEMIDE 10 MG/ML IJ SOLN: 60.0000 mg | INTRAMUSCULAR | Status: AC

## 2013-12-25 MED ORDER — CARVEDILOL 12.5 MG PO TABS: 12.5000 mg | ORAL_TABLET | Freq: Two times a day (BID) | ORAL | Status: DC

## 2013-12-25 MED ORDER — LEVALBUTEROL HCL 0.63 MG/3ML IN NEBU
0.6300 mg | INHALATION_SOLUTION | Freq: Once | RESPIRATORY_TRACT | Status: AC
  Administered 2013-12-25: 0.63 mg via RESPIRATORY_TRACT
  Filled 2013-12-25: qty 3

## 2013-12-25 MED ORDER — WHITE PETROLATUM GEL
Status: AC
  Administered 2013-12-25: 07:00:00
  Filled 2013-12-25: qty 5

## 2013-12-25 MED ORDER — AMIODARONE HCL 200 MG PO TABS
400.0000 mg | ORAL_TABLET | Freq: Two times a day (BID) | ORAL | Status: DC
  Administered 2013-12-25 – 2013-12-26 (×3): 400 mg via ORAL
  Filled 2013-12-25 (×5): qty 2

## 2013-12-25 MED ORDER — FUROSEMIDE 10 MG/ML IJ SOLN
60.0000 mg | Freq: Once | INTRAMUSCULAR | Status: AC
  Administered 2013-12-25: 60 mg via INTRAVENOUS

## 2013-12-25 NOTE — Progress Notes (Signed)
  Echocardiogram 2D Echocardiogram has been performed.  [name] 12/25/2013, 2:02 PM

## 2013-12-25 NOTE — Progress Notes (Signed)
@  approx 0000 this RN called to pt's room due to dyspnea. Pt tachypneic (30s-40s), breathing shallow, and slightly hypotensive (88-90/40s) and endorsing need to have BM. O2 increased from 2L to 6L [place], and ultimately to 100%NRB. Dr. Aundra Dubin paged and orders received to stop Amio gtt, switch to PO Amio in am, and administer Lasix IV in 2 hours. Pt's tachypnea increased with increased desire to have BM (possibly secondary to anxiety?) and MD paged again. PRN Xanax order given, Lasix ordered for now, STAT chest xray and nebs ordered. After pt had BM dyspnea seems to have improved. Pt currently on 4L [place] and Sp02 95%. Will hold lasix for now due to BP and continue to monitor and assess. Though pt appears anxious, she is currently refusing Xanax.

## 2013-12-25 NOTE — Progress Notes (Addendum)
Advanced Heart Failure Rounding Note   Subjective:     [name] is a 80 y.o. female with history of diastolic heart failure, severe mitral regurgitation no surgical candidate, HL and hypothyroidism.   Yesterday she was admitted from HF clinic with new onset A fib.  She converted on own before amiodarone started. She was given amiodarone bolus and started on amiodarone 400 mg bid as well as eliquis. Overnight she was short of breath and she was given IV lasix which improved her symptoms.   Feeling better. Denies SOB.   Pro BNP 11373  Creatinine 1.1>1.4   Objective:   Weight Range:  Vital Signs:   Temp:  [97.4 F (36.3 C)-97.8 F (36.6 C)] 97.7 F (36.5 C) (02/24 0800) Pulse Rate:  [60-115] 81 (02/24 0800) Resp:  [13-32] 32 (02/24 0800) BP: (88-110)/(45-56) 107/52 mmHg (02/24 0800) SpO2:  [87 %-100 %] 91 % (02/24 0800) Weight:  [112 lb 8 oz (51.03 kg)-115 lb 11.9 oz (52.5 kg)] 115 lb 11.9 oz (52.5 kg) (02/24 0500) Last BM Date: 12/25/13  Weight change: Filed Weights   12/24/13 1800 12/25/13 0500  Weight: 112 lb 8 oz (51.03 kg) 115 lb 11.9 oz (52.5 kg)    Intake/Output:   Intake/Output Summary (Last 24 hours) at 12/25/13 0943 Last data filed at 12/25/13 0700  Gross per 24 hour  Intake 686.34 ml  Output     50 ml  Net 636.34 ml     Physical Exam:  General: Elderly. No resp difficulty Daughter present  HEENT: normal  Neck: supple. JVP ~9-10 with prominent CV waves. Carotids 2+ bilaterally; no bruits. No lymphadenopathy or thryomegaly appreciated.  Cor: s/p L mastectomy. PMI normal. Regular rate & irregular rhythm. 3/6 MR murmur. No rubs, gallops.  Lungs: clear on 2 liters [place]  Abdomen: soft, nontender, nondistended. No hepatosplenomegaly. No bruits or masses. Good bowel sounds.  Extremities: no cyanosis, clubbing, rash, tr edema  Neuro: alert & orientedx3, cranial nerves grossly intact. Moves all 4 extremities w/o difficulty. Affect pleasant.    ECG: NSR   70-80s   Labs: Basic Metabolic Panel:  Recent Labs Lab 12/24/13 2000 12/25/13 0225  NA 140 138  K 2.7* 4.8  CL 90* 93*  CO2 35* 32  GLUCOSE 140* 190*  BUN 27* 33*  CREATININE 1.19* 1.46*  CALCIUM 9.7 9.6  MG 2.2  --     Liver Function Tests:  Recent Labs Lab 12/24/13 2000  AST 30  ALT 18  ALKPHOS 80  BILITOT 0.5  PROT 7.8  ALBUMIN 3.3*   No results found for this basename: LIPASE, AMYLASE,  in the last 168 hours No results found for this basename: AMMONIA,  in the last 168 hours  CBC:  Recent Labs Lab 12/24/13 2000  WBC 7.4  NEUTROABS 4.3  HGB 10.7*  HCT 33.2*  MCV 94.3  PLT 158    Cardiac Enzymes: No results found for this basename: CKTOTAL, CKMB, CKMBINDEX, TROPONINI,  in the last 168 hours  BNP: BNP (last 3 results)  Recent Labs  10/17/13 1538 12/24/13 2000  PROBNP 12898.0* 11373.0*     Other results:  EKG:   Imaging: Dg Chest Port 1 View  12/25/2013   CLINICAL DATA:  Shortness of breath.  EXAM: PORTABLE CHEST - 1 VIEW  COMPARISON:  09/22/2012 and prior chest radiographs  FINDINGS: Cardiomegaly is identified.  Interstitial prominence is again noted.  There is no evidence of airspace disease, pleural effusion or pneumothorax.  No acute bony  abnormalities are identified.  IMPRESSION: Cardiomegaly without evidence of acute cardiopulmonary disease.  Chronic interstitial opacities.   Electronically Signed   By: Hassan Rowan M.D.   On: 12/25/2013 01:23      Medications:     Scheduled Medications: . amiodarone  400 mg Oral BID  . apixaban  2.5 mg Oral BID  . aspirin EC  81 mg Oral Daily  . carvedilol  25 mg Oral BID WC  . cycloSPORINE  1 drop Both Eyes BID  . furosemide  60 mg Intravenous STAT  . furosemide  60 mg Oral BID  . potassium chloride  40 mEq Oral BID  . sodium chloride  3 mL Intravenous Q12H     Infusions:     PRN Medications:  sodium chloride, acetaminophen, ALPRAZolam, ondansetron (ZOFRAN) IV, sodium  chloride   Assessment:  1. New Onset A-Fib with RVR  2. Chronic Diastolic Heart Failure  3. Hypothyroid  4. Limited Code---> No CPR 5. Severe MR  Plan/Discussion:    Now in NSR. Continue amiodarone 400 mg bid and eliquis 2.5 mg twice a day.   Volume status mildly elevated. Stop po lasix and give 60 mg IV lasix this afternoon. Renal function trending up. Watch closely.   OOB today. Possible D/C tomorrow.    Length of Stay: 1   CLEGG,AMY NP-C 12/25/2013, 9:43 AM  Advanced Heart Failure Team Pager [phone] (M-F; [place])  Please contact [facility] for night-coverage after hours (4p -7a ) and weekends on [email or web address]  Patient seen with NP, agree with the above note.   1. Chronic diastolic CHF with severe MR: JVP elevated but creatinine is up a bit.  I will give her Lasix 60 mg IV x 1 this afternoon and follow response and creatinine.   2. Paroxysmal atrial fibrillation: Now in NSR on amiodarone.  Will be hard to keep her in NSR without amiodarone given severe MR.  She has been started on reduced-dose Eliquis (age and weight).   3. CKD: Follow creatinine closely with diuresis.   Possible discharge tomorrow if doing well.   Loralie Champagne 12/25/2013 11:30 AM

## 2013-12-25 NOTE — Progress Notes (Signed)
Utilization review completed.  

## 2013-12-26 ENCOUNTER — Encounter (HOSPITAL_COMMUNITY): Payer: Self-pay | Admitting: Emergency Medicine

## 2013-12-26 ENCOUNTER — Emergency Department (HOSPITAL_COMMUNITY): Payer: Medicare Other

## 2013-12-26 ENCOUNTER — Inpatient Hospital Stay (HOSPITAL_COMMUNITY)
Admission: EM | Admit: 2013-12-26 | Discharge: 2013-12-31 | Disposition: A | Discharge status: Home / Self Care | Payer: Medicare Other | Source: Home / Self Care | Attending: Internal Medicine | Admitting: Internal Medicine

## 2013-12-26 DIAGNOSIS — I5032 Chronic diastolic (congestive) heart failure: Secondary | ICD-10-CM

## 2013-12-26 DIAGNOSIS — E875 Hyperkalemia: Secondary | ICD-10-CM

## 2013-12-26 DIAGNOSIS — N183 Chronic kidney disease, stage 3 unspecified: Secondary | ICD-10-CM | POA: Diagnosis present

## 2013-12-26 DIAGNOSIS — M810 Age-related osteoporosis without current pathological fracture: Secondary | ICD-10-CM | POA: Diagnosis present

## 2013-12-26 DIAGNOSIS — I359 Nonrheumatic aortic valve disorder, unspecified: Secondary | ICD-10-CM | POA: Diagnosis present

## 2013-12-26 DIAGNOSIS — I2510 Atherosclerotic heart disease of native coronary artery without angina pectoris: Secondary | ICD-10-CM | POA: Diagnosis present

## 2013-12-26 DIAGNOSIS — I5033 Acute on chronic diastolic (congestive) heart failure: Secondary | ICD-10-CM

## 2013-12-26 DIAGNOSIS — Z79899 Other long term (current) drug therapy: Secondary | ICD-10-CM

## 2013-12-26 DIAGNOSIS — N189 Chronic kidney disease, unspecified: Secondary | ICD-10-CM

## 2013-12-26 DIAGNOSIS — E876 Hypokalemia: Secondary | ICD-10-CM | POA: Diagnosis not present

## 2013-12-26 DIAGNOSIS — J9620 Acute and chronic respiratory failure, unspecified whether with hypoxia or hypercapnia: Secondary | ICD-10-CM | POA: Diagnosis present

## 2013-12-26 DIAGNOSIS — I4891 Unspecified atrial fibrillation: Secondary | ICD-10-CM | POA: Diagnosis present

## 2013-12-26 DIAGNOSIS — E039 Hypothyroidism, unspecified: Secondary | ICD-10-CM

## 2013-12-26 DIAGNOSIS — I509 Heart failure, unspecified: Secondary | ICD-10-CM

## 2013-12-26 DIAGNOSIS — Z853 Personal history of malignant neoplasm of breast: Secondary | ICD-10-CM

## 2013-12-26 DIAGNOSIS — I4510 Unspecified right bundle-branch block: Secondary | ICD-10-CM | POA: Diagnosis present

## 2013-12-26 DIAGNOSIS — D649 Anemia, unspecified: Secondary | ICD-10-CM | POA: Diagnosis present

## 2013-12-26 DIAGNOSIS — N179 Acute kidney failure, unspecified: Secondary | ICD-10-CM | POA: Diagnosis present

## 2013-12-26 DIAGNOSIS — E785 Hyperlipidemia, unspecified: Secondary | ICD-10-CM

## 2013-12-26 DIAGNOSIS — I5031 Acute diastolic (congestive) heart failure: Secondary | ICD-10-CM

## 2013-12-26 DIAGNOSIS — I080 Rheumatic disorders of both mitral and aortic valves: Secondary | ICD-10-CM

## 2013-12-26 DIAGNOSIS — I059 Rheumatic mitral valve disease, unspecified: Secondary | ICD-10-CM | POA: Diagnosis present

## 2013-12-26 DIAGNOSIS — N184 Chronic kidney disease, stage 4 (severe): Secondary | ICD-10-CM | POA: Diagnosis present

## 2013-12-26 DIAGNOSIS — Z8249 Family history of ischemic heart disease and other diseases of the circulatory system: Secondary | ICD-10-CM

## 2013-12-26 LAB — BASIC METABOLIC PANEL
BUN: 33 mg/dL — ABNORMAL HIGH (ref 6–23)
BUN: 35 mg/dL — AB (ref 6–23)
CHLORIDE: 95 meq/L — AB (ref 96–112)
CO2: 35 mEq/L — ABNORMAL HIGH (ref 19–32)
CO2: 31 mEq/L (ref 19–32)
Calcium: 9.1 mg/dL (ref 8.4–10.5)
Calcium: 9.4 mg/dL (ref 8.4–10.5)
Chloride: 95 mEq/L — ABNORMAL LOW (ref 96–112)
Creatinine, Ser: 1.47 mg/dL — ABNORMAL HIGH (ref 0.50–1.10)
Creatinine, Ser: 1.55 mg/dL — ABNORMAL HIGH (ref 0.50–1.10)
GFR calc Af Amer: 32 mL/min — ABNORMAL LOW (ref >90)
GFR calc non Af Amer: 30 mL/min — ABNORMAL LOW (ref >90)
GFR calc non Af Amer: 28 mL/min — ABNORMAL LOW (ref >90)
GFR, EST AFRICAN AMERICAN: 34 mL/min — AB (ref >90)
GLUCOSE: 99 mg/dL (ref 70–99)
GLUCOSE: 174 mg/dL — AB (ref 70–99)
POTASSIUM: 3.9 meq/L (ref 3.7–5.3)
Potassium: 5.6 mEq/L — ABNORMAL HIGH (ref 3.7–5.3)
SODIUM: 140 meq/L (ref 137–147)
Sodium: 139 mEq/L (ref 137–147)

## 2013-12-26 LAB — PROTIME-INR
INR: 1.53 — AB (ref 0.00–1.49)
Prothrombin Time: 18 seconds — ABNORMAL HIGH (ref 11.6–15.2)

## 2013-12-26 LAB — CBC WITH DIFFERENTIAL/PLATELET
Basophils Absolute: 0 10*3/uL (ref 0.0–0.1)
Basophils Relative: 0 % (ref 0–1)
Eosinophils Absolute: 0.2 10*3/uL (ref 0.0–0.7)
Eosinophils Relative: 2 % (ref 0–5)
HCT: 36.3 % (ref 36.0–46.0)
HEMOGLOBIN: 11.9 g/dL — AB (ref 12.0–15.0)
LYMPHS ABS: 2.7 10*3/uL (ref 0.7–4.0)
Lymphocytes Relative: 29 % (ref 12–46)
MCH: 31 pg (ref 26.0–34.0)
MCHC: 32.8 g/dL (ref 30.0–36.0)
MCV: 94.5 fL (ref 78.0–100.0)
MONOS PCT: 11 % (ref 3–12)
Monocytes Absolute: 1 10*3/uL (ref 0.1–1.0)
NEUTROS ABS: 5.3 10*3/uL (ref 1.7–7.7)
Neutrophils Relative %: 58 % (ref 43–77)
Platelets: 166 10*3/uL (ref 150–400)
RBC: 3.84 MIL/uL — AB (ref 3.87–5.11)
RDW: 15.1 % (ref 11.5–15.5)
WBC: 9.2 10*3/uL (ref 4.0–10.5)

## 2013-12-26 LAB — PRO B NATRIURETIC PEPTIDE: Pro B Natriuretic peptide (BNP): 12752 pg/mL — ABNORMAL HIGH (ref 0–450)

## 2013-12-26 MED ORDER — FUROSEMIDE 10 MG/ML IJ SOLN
60.0000 mg | Freq: Once | INTRAMUSCULAR | Status: AC
  Administered 2013-12-26: 60 mg via INTRAVENOUS
  Filled 2013-12-26: qty 6

## 2013-12-26 MED ORDER — AMIODARONE HCL 400 MG PO TABS: ORAL_TABLET | ORAL | Status: DC

## 2013-12-26 MED ORDER — CARVEDILOL 6.25 MG PO TABS: 6.2500 mg | ORAL_TABLET | Freq: Two times a day (BID) | ORAL | Status: DC

## 2013-12-26 MED ORDER — APIXABAN 2.5 MG PO TABS: 2.5000 mg | ORAL_TABLET | Freq: Two times a day (BID) | ORAL | Status: DC

## 2013-12-26 NOTE — Clinical Documentation Improvement (Signed)
Possible Clinical Conditions?   _______CKD Stage I - GFR > OR = 90 _______CKD Stage II - GFR 60-80 _______CKD Stage III - GFR 30-59 _______CKD Stage IV - GFR 15-29 _______CKD Stage V - GFR < 15 _______ESRD (End Stage Renal Disease) _______Other condition_____________ _______Cannot Clinically determine   Supporting Information: Per 12/25/13 MD  progress note: Patient has CKD: Follow creatinine closely with diuresis.  Lab Range  12/24/13  2000 12/25/13 - 0225   NA 140 138 K 2.7* 4.8 CL 90* 93* CO2 35* 32 GLUCOSE 140* 190* BUN 27* 33* CREATININE 1.19* 1.46* CALCIUM 9.7 9.6 MG 2.2 --   GFR, Est Afr Am(mL/min) 34L 35L GFR, Est Non Af Am(mL/min) 30L 30L   Thank You, Serena Colonel ,RN Clinical Documentation Specialist:  [facility]

## 2013-12-26 NOTE — ED Provider Notes (Signed)
CSN: [id number]     Arrival date & time 12/26/13  2058 History   First MD Initiated Contact with Patient 12/26/13 2128     Chief Complaint  Patient presents with  . Shortness of Breath     HPI  Patient presents with dyspnea, anxiety. The patient was discharged earlier today from the heart failure service.  She notes that on discharge she was slightly better, though she became worse in the hours after arrival home.  Currently she has minimal complaints, though she does have chronic dyspnea, unchanged. At home she is on a 24/7 oxygen.  She notes that she takes her medication as directed, including today's medication. She currently denies chest pain, headache, belly pain, fever, chills. No clear alleviating or excessive factors for this increase in dyspnea.  No  Past Medical History  Diagnosis Date  . Coronary artery disease   . Peripheral neuropathy   . Aortic insufficiency     mild  . Cerumen impaction   . Unspecified diastolic heart failure   . Hyperlipemia   . Mitral valve prolapse     "severe" (09/22/2012)  . Mitral regurgitation     severe  . Urosepsis   . Anemia, unspecified   . Osteoporosis   . Adenocarcinoma, breast   . Irritable bladder   . Urinary incontinence   . Hypothyroidism   . GERD (gastroesophageal reflux disease)   . H/O: whooping cough   . Hypercholesterolemia     "stopped statins ~ 2 yr ago" (09/22/2012)  . Heart murmur   . CHF (congestive heart failure)   . Shortness of breath     "basically all the time; worse w/exertion" (09/22/2012)  . Bleeding hemorrhoids ~ 2011  . Osteoarthritis     "knees, hips, hands" (09/22/2012)   Past Surgical History  Procedure Laterality Date  . Appendectomy  10/2004  . Cataract extraction w/ intraocular lens  implant, bilateral  ? 1990's  . Breast biopsy      "several on both sides; years ago; none since 1975" (09/22/2012)  . Tonsillectomy      "as a child" (09/22/2012)  . Mastectomy  ~ 1975    left breast  .  Closed reduction patella fracture  ~ 1958    right    Family History  Problem Relation Age of Onset  . Heart disease Mother   . Cancer Mother     breast cancer  . Stroke Father   . Heart disease Father   . Heart disease Sister   . Alzheimer's disease Sister   . Heart disease Sister   . Heart disease Brother    History  Substance Use Topics  . Smoking status: Never Smoker   . Smokeless tobacco: Never Used  . Alcohol Use: No   OB History   Grav Para Term Preterm Abortions TAB SAB Ect Mult Living                 Review of Systems  Constitutional:       Per HPI, otherwise negative  HENT:       Per HPI, otherwise negative  Respiratory:       Per HPI, otherwise negative  Cardiovascular:       Per HPI, otherwise negative  Gastrointestinal: Negative for vomiting.  Endocrine:       Negative aside from HPI  Genitourinary:       Neg aside from HPI   Musculoskeletal:       Per HPI, otherwise negative  Skin: Negative.   Neurological: Negative for syncope.      Allergies  Review of patient's allergies indicates no known allergies.  Home Medications   Current Outpatient Rx  Name  Route  Sig  Dispense  Refill  . amiodarone (PACERONE) 400 MG tablet   Oral   Take 400 mg by mouth See admin instructions. *taking 400mg  twice daily for 5 days, then 200mg  daily thereafter* Started regimen 12/24/13         . apixaban (ELIQUIS) 2.5 MG TABS tablet   Oral   Take 2.5 mg by mouth 2 (two) times daily.         . calcium-vitamin D (OSCAL WITH D) 500-200 MG-UNIT per tablet   Oral   Take 1 tablet by mouth 3 (three) times daily.          . carvedilol (COREG) 6.25 MG tablet   Oral   Take 6.25 mg by mouth 2 (two) times daily with a meal.         . cycloSPORINE (RESTASIS) 0.05 % ophthalmic emulsion   Both Eyes   Place 1 drop into both eyes 2 (two) times daily.          . furosemide (LASIX) 40 MG tablet   Oral   Take 60 mg by mouth 2 (two) times daily.         [name]  guaiFENesin (MUCINEX) 600 MG 12 hr tablet   Oral   Take 1,200 mg by mouth 2 (two) times daily.         . Multiple Vitamin (MULTIVITAMIN) tablet   Oral   Take 1 tablet by mouth daily. Athruz Select 50+         . Multiple Vitamins-Minerals (PRESERVISION AREDS PO)   Oral   Take 2 tablets by mouth daily.         [name] omega-3 acid ethyl esters (LOVAZA) 1 G capsule   Oral   Take 2 g by mouth every evening.         Vladimir Faster Glycol-Propyl Glycol (SYSTANE) 0.4-0.3 % GEL   Both Eyes   Place 1 drop into both eyes at bedtime.         . Polyvinyl Alcohol-Povidone (FRESHKOTE OP)   Both Eyes   Place 1 drop into both eyes 4 (four) times daily.          . potassium chloride 40 MEQ/15ML (20%) LIQD   Oral   Take 40 mEq by mouth 2 (two) times daily.          BP 106/60  Pulse 66  Resp 32  SpO2 97% Physical Exam  Nursing note and vitals reviewed. Constitutional: She is oriented to person, place, and time. She appears cachectic. She appears ill.  HENT:  Head: Normocephalic and atraumatic.  Hearing aid in place  Eyes: Conjunctivae and EOM are normal.  Cardiovascular: Normal rate and regular rhythm.   Pulmonary/Chest: Effort normal. No stridor. No respiratory distress. She has decreased breath sounds.  Abdominal: She exhibits no distension.  Musculoskeletal: She exhibits edema.  Neurological: She is alert and oriented to person, place, and time. No cranial nerve deficit. Coordination normal.  Skin: Skin is warm and dry.  Psychiatric: She has a normal mood and affect.    ED Course  Procedures (including critical care time) Labs Review Labs Reviewed  CBC WITH DIFFERENTIAL - Abnormal; Notable for the following:    RBC 3.84 (*)    Hemoglobin 11.9 (*)    All other  components within normal limits  BASIC METABOLIC PANEL - Abnormal; Notable for the following:    Potassium 5.6 (*)    Chloride 95 (*)    Glucose, Bld 174 (*)    BUN 35 (*)    Creatinine, Ser 1.55 (*)    GFR  calc non Af Amer 28 (*)    GFR calc Af Amer 32 (*)    All other components within normal limits  PRO B NATRIURETIC PEPTIDE - Abnormal; Notable for the following:    Pro B Natriuretic peptide (BNP) 12752.0 (*)    All other components within normal limits  PROTIME-INR - Abnormal; Notable for the following:    Prothrombin Time 18.0 (*)    INR 1.53 (*)    All other components within normal limits   Imaging Review Dg Chest Portable 1 View  12/26/2013   CLINICAL DATA:  Shortness of breath for several days.  EXAM: PORTABLE CHEST - 1 VIEW  COMPARISON:  Chest radiograph performed 12/25/2013  FINDINGS: The lungs are hypoexpanded. Vascular congestion is noted. Chronically increased interstitial markings are seen, grossly unchanged from the prior study. No pleural effusion or pneumothorax is identified.  The cardiomediastinal silhouette is enlarged. No acute osseous abnormalities are seen. Clips are seen overlying the left axilla.  IMPRESSION: Lungs hypoexpanded. Vascular congestion and cardiomegaly noted. Chronically increased interstitial markings again seen.   Electronically Signed   By: Garald Balding M.D.   On: 12/26/2013 22:01   Dg Chest Port 1 View  12/25/2013   CLINICAL DATA:  Shortness of breath.  EXAM: PORTABLE CHEST - 1 VIEW  COMPARISON:  09/22/2012 and prior chest radiographs  FINDINGS: Cardiomegaly is identified.  Interstitial prominence is again noted.  There is no evidence of airspace disease, pleural effusion or pneumothorax.  No acute bony abnormalities are identified.  IMPRESSION: Cardiomegaly without evidence of acute cardiopulmonary disease.  Chronic interstitial opacities.   Electronically Signed   By: Hassan Rowan M.D.   On: 12/25/2013 01:23     after the initial evaluation I reviewed the patient's chart.  EKG shows sinus rhythm, rate 67, intraventricular conduction delay, T-wave abnormalities, artifact, no significant changes from 2 days ago.  10:59 PM Patient appears more  comfortable.  Labs demonstrate persistently elevated BNP, new hyperkalemia.  Patient will receive Lasix for potassium excretion.  EKG did not show notable changes in T-wave amplitude from a prior study.   MDM   This elderly female presents on the same day as discharge now with dyspnea, generalized discomfort, anxiety.  On exam she is tachypneic, borderline hypotensive, but awake and alert. Patient's recent evaluation here included an echocardiogram demonstrating diastolic dysfunction. Tonight the patient has evidence of persistent heart failure, and new hyperkalemia with worsening renal dysfunction.  For her hyperkalemia she received Lasix, required admission for further evaluation and management given the persistency of her dyspnea.  CRITICAL CARE Performed by: Carmin Muskrat Total critical care time: 35 Critical care time was exclusive of separately billable procedures and treating other patients. Critical care was necessary to treat or prevent imminent or life-threatening deterioration. Critical care was time spent personally by me on the following activities: development of treatment plan with patient and/or surrogate as well as nursing, discussions with consultants, evaluation of patient's response to treatment, examination of patient, obtaining history from patient or surrogate, ordering and performing treatments and interventions, ordering and review of laboratory studies, ordering and review of radiographic studies, pulse oximetry and re-evaluation of patient's condition.   Carmin Muskrat, MD  12/26/13 2302 

## 2013-12-26 NOTE — ED Notes (Signed)
Patient presents with c/o SOB .  Discharged from here about 530pm

## 2013-12-26 NOTE — Discharge Summary (Signed)
Advanced Heart Failure Team  Discharge Summary   Patient ID: LYNNETT LANGLINAIS MRN: [id number], DOB/AGE: [birth year] 80 y.o. Admit date: 12/24/2013 D/C date:     12/26/2013   Primary Discharge Diagnoses:  1. New Onset A-Fib with RVR  2. Chronic Diastolic Heart Failure  3. Hypothyroid  4. Limited Code---> No CPR  5. Severe MR  6. Chronic Respiratory Failure--> on 2 liters [place] continuously at home    Hospital Course:  Ms. Posa is a 80 y.o. female with history of diastolic heart failure, severe mitral regurgitation no surgical candidate, HL and hypothyroidism.  She was admitted from the HF clinic with new onset A fib verified by EKG.  She was admitted to amiodarone load and initiation of anticoagulants. She converted to NSR before amiodarone started however there was concern she was going in and out of A fib. She was given amiodarone bolus and started on amiodarone 400 mg bid as well as eliquis. She has maintained SR and tolerated amiodarone as well low dose eliquis. She will continue on amiodarone 400 mg twice a day x 5 days then begin to taper to 200 mg twice a day for 7 days, and then 200 mg daily. She will also continue a reduced dose carvedilol,at 6.25 mg twice a day. We will check LFTs and TSH next week.   From a HF perspective she had mild volume overload and this was effectively managed with one dose of IV lasix. She was later transitioned back to her home diuretic regimen which is lasix 60 mg twice a day. Renal function was followed with a slight bump in her creatinine. We will check BMET next week.   She will continue to be followed closely in the HF clinic with follow up 01/01/14 at 10:30.  [facility] will follow for HF. She will continue on 2 liters [place] continuously which is her regimen.    Discharge Weight Range: Discharge Weight 114 pounds.  Discharge Vitals: Blood pressure 98/45, pulse 61, temperature 97.9 F (36.6 C), temperature source Oral, resp. rate 20, height 4\' 8"  (1.422 m),  weight 114 lb 10.2 oz (52 kg), SpO2 96.00%.  Labs: Lab Results  Component Value Date   WBC 7.4 12/24/2013   HGB 10.7* 12/24/2013   HCT 33.2* 12/24/2013   MCV 94.3 12/24/2013   PLT 158 12/24/2013    Recent Labs Lab 12/24/13 2000  12/26/13 0232  NA 140  < > 140  K 2.7*  < > 3.9  CL 90*  < > 95*  CO2 35*  < > 35*  BUN 27*  < > 33*  CREATININE 1.19*  < > 1.47*  CALCIUM 9.7  < > 9.1  PROT 7.8  --   --   BILITOT 0.5  --   --   ALKPHOS 80  --   --   ALT 18  --   --   AST 30  --   --   GLUCOSE 140*  < > 99  < > = values in this interval not displayed. Lab Results  Component Value Date   CHOL 115 02/17/2010   HDL 61.10 02/17/2010   LDLCALC 23 02/17/2010   TRIG 155.0* 02/17/2010   BNP (last 3 results)  Recent Labs  10/17/13 1538 12/24/13 2000  PROBNP 12898.0* 11373.0*    Diagnostic Studies/Procedures   Dg Chest Port 1 View  12/25/2013   CLINICAL DATA:  Shortness of breath.  EXAM: PORTABLE CHEST - 1 VIEW  COMPARISON:  09/22/2012 and prior  chest radiographs  FINDINGS: Cardiomegaly is identified.  Interstitial prominence is again noted.  There is no evidence of airspace disease, pleural effusion or pneumothorax.  No acute bony abnormalities are identified.  IMPRESSION: Cardiomegaly without evidence of acute cardiopulmonary disease.  Chronic interstitial opacities.   Electronically Signed   By: Laveda AbbeJeff  Hu M.D.   On: 12/25/2013 01:23    Discharge Medications     Medication List    STOP taking these medications       aspirin EC 81 MG tablet      TAKE these medications       amiodarone 400 MG tablet  Commonly known as:  PACERONE  Take 400 mg tiwce a day for 5 days then 200 mg twice a day for 7 days then 200 mg daily     apixaban 2.5 MG Tabs tablet  Commonly known as:  ELIQUIS  Take 1 tablet (2.5 mg total) by mouth 2 (two) times daily.     calcium-vitamin D 500-200 MG-UNIT per tablet  Commonly known as:  OSCAL WITH D  Take 1 tablet by mouth 3 (three) times daily.      carvedilol 6.25 MG tablet  Commonly known as:  COREG  Take 1 tablet (6.25 mg total) by mouth 2 (two) times daily with a meal.     cycloSPORINE 0.05 % ophthalmic emulsion  Commonly known as:  RESTASIS  Place 1 drop into both eyes 2 (two) times daily.     FRESHKOTE OP  Place 1 drop into both eyes 4 (four) times daily.     furosemide 40 MG tablet  Commonly known as:  LASIX  Take 1.5 tablets (60 mg total) by mouth 2 (two) times daily.     guaiFENesin 600 MG 12 hr tablet  Commonly known as:  MUCINEX  Take 1,200 mg by mouth 2 (two) times daily.     multivitamin tablet  Take 1 tablet by mouth daily. Athruz Select 50+     omega-3 acid ethyl esters 1 G capsule  Commonly known as:  LOVAZA  Take 2 g by mouth every evening.     potassium chloride 40 MEQ/15ML (20%) Liqd  Take 15 mLs (40 mEq total) by mouth 2 (two) times daily.     PRESERVISION AREDS PO  Take 2 tablets by mouth daily.     sulfamethoxazole-trimethoprim 400-80 MG per tablet  Commonly known as:  BACTRIM,SEPTRA  Take 1 tablet by mouth every evening.     SYSTANE 0.4-0.3 % Gel  Generic drug:  Polyethyl Glycol-Propyl Glycol  Place 1 drop into both eyes at bedtime.        Disposition   The patient will be discharged in stable condition to home with her son and daughter     Discharge Orders   Future Appointments Provider Department Dept Phone   01/01/2014 10:30 AM [facility] [id number]   Future Orders Complete By Expires   ACE Inhibitor / ARB already ordered  As directed    Comments:     hypotension   Diet - low sodium heart healthy  As directed    Heart Failure patients record your daily weight using the same scale at the same time of day  As directed    Increase activity slowly  As directed      Follow-up Information   Follow up with Aquil Duhe, NP On 01/01/2014. (at 10:30 Garage Code 3000)    Specialty:  Nurse Practitioner  Contact information:   [address] [place] [address] [phone]         Duration of Discharge Encounter: Greater than 35 minutes   Signed, Loghan Kurtzman NP-C  12/26/2013, 12:32 PM

## 2013-12-26 NOTE — Progress Notes (Addendum)
Pt discharged home with caregivers (both son and daughter) per MD orders. All discharge instructions reviewed and questions answered with teachback. No complaints. Candyce Churn

## 2013-12-26 NOTE — Discharge Instructions (Signed)
HH-RN for HF management arranged with [facility] [facility]    Heart Failure Heart failure means your heart has trouble pumping blood. This makes it hard for your body to work well. Heart failure is usually a long-term (chronic) condition. You must take good care of yourself and follow your doctor's treatment plan. HOME CARE  Take your heart medicine as told by your doctor.  Do not stop taking medicine unless your doctor tells you to.  Do not skip any dose of medicine.  Refill your medicines before they run out.  Take other medicines only as told by your doctor or pharmacist.  Stay active if told by your doctor. The elderly and people with severe heart failure should talk with a doctor about physical activity.  Eat heart healthy foods. Choose foods that are without trans fat and are low in saturated fat, cholesterol, and salt (sodium). This includes fresh or frozen fruits and vegetables, fish, lean meats, fat-free or low-fat dairy foods, whole grains, and high-fiber foods. Lentils and dried peas and beans (legumes) are also good choices.  Limit salt if told by your doctor.  Cook in a healthy way. Roast, grill, broil, bake, poach, steam, or stir-fry foods.  Limit fluids as told by your doctor.  Weigh yourself every morning. Do this after you pee (urinate) and before you eat breakfast. Write down your weight to give to your doctor.  Take your blood pressure and write it down if your doctor tell you to.  Ask your doctor how to check your pulse. Check your pulse as told.  Lose weight if told by your doctor.  Stop smoking or chewing tobacco. Do not use gum or patches that help you quit without your doctor's approval.  Schedule and go to doctor visits as told.  Nonpregnant women should have no more than 1 drink a day. Men should have no more than 2 drinks a day. Talk to your doctor about drinking alcohol.  Stop illegal drug use.  Stay current with shots  (immunizations).  Manage your health conditions as told by your doctor.  Learn to manage your stress.  Rest when you are tired.  If it is really hot outside:  Avoid intense activities.  Use air conditioning or fans, or get in a cooler place.  Avoid caffeine and alcohol.  Wear loose-fitting, lightweight, and light-colored clothing.  If it is really cold outside:  Avoid intense activities.  Layer your clothing.  Wear mittens or gloves, a hat, and a scarf when going outside.  Avoid alcohol.  Learn about heart failure and get support as needed.  Get help to maintain or improve your quality of life and your ability to care for yourself as needed. GET HELP IF:   You gain 03 lb/1.4 kg or more in 1 day or 05 lb/2.3 kg in a week.  You are more short of breath than usual.  You cannot do your normal activities.  You tire easily.  You cough more than normal, especially with activity.  You have any or more puffiness (swelling) in areas such as your hands, feet, ankles, or belly (abdomen).  You cannot sleep because it is hard to breathe.  You feel like your heart is beating fast (palpitations).  You get dizzy or lightheaded when you stand up. GET HELP RIGHT AWAY IF:   You have trouble breathing.  There is a change in mental status, such as becoming less alert or not being able to focus.  You have chest  pain or discomfort.  You faint. MAKE SURE YOU:   Understand these instructions.  Will watch your condition.  Will get help right away if you are not doing well or get worse. Document Released: 07/27/2008 Document Revised: 02/12/2013 Document Reviewed: 05/18/2012 [employer] [facility] [employer] Patient Information 2014 [place], [place].

## 2013-12-26 NOTE — Care Management Note (Signed)
    Page 1 of 2   12/26/2013     2:55:31 PM   CARE MANAGEMENT NOTE 12/26/2013  Patient:  [name], [name]   Account Number:  [id number]  Date Initiated:  12/26/2013  Documentation initiated by:  Marvetta Gibbons  Subjective/Objective Assessment:   Pt admitted with afib     Action/Plan:   PTA pt lived at home with daughter- has used [facility] for [facility] in past has home 02 with [facility]   Anticipated DC Date:  12/26/2013   Anticipated DC Plan:  [place]  CM consult      [facility] Choice  HOME HEALTH   Choice offered to / List presented to:  C-4 Adult Children        HH arranged  HH-10 DISEASE MANAGEMENT  HH-1 RN      [place].   Status of service:  Completed, signed off Medicare Important Message given?   (If response is "NO", the following Medicare IM given date fields will be blank) Date Medicare IM given:   Date Additional Medicare IM given:    Discharge Disposition:  [place]  Per UR Regulation:  Reviewed for med. necessity/level of care/duration of stay  If discussed at [place] of Stay Meetings, dates discussed:    Comments:  12/26/13- 1130- Marvetta Gibbons RN, BSN [phone] Pt for d/c home today- HF HH orders in epic- in to speak with pt and daughter- per conversation pt has used [facility] in the past and would like to use them again for [facility] services also has her home 02 with [facility]- referral called to Butch Penny with [facility] for [place] for CHF management. per HF PA- Amy Clegg- pt has Eliquis cards (verified this with daughter) daughter states PA explained cards to her and she has no further questions. Per insurance check- auth required [id number] patient will be responsible for 20-30% of the total cost of the medication-- no further needs.

## 2013-12-26 NOTE — Progress Notes (Addendum)
Advanced Heart Failure Rounding Note   Subjective:      [name] is a 80 y.o. female with history of diastolic heart failure, severe mitral regurgitation no surgical candidate, HL and hypothyroidism.   Yesterday she was admitted from HF clinic with new onset A fib.  She converted to NSR before amiodarone started. She was given amiodarone bolus and started on amiodarone 400 mg bid as well as eliquis.   Yesterday carvedilol was cut back to 6.25 mg twice a day and she continue on amiodarone 400 mg twice a day. Weight down 1 pound. Denies SOB .     Pro BNP 11373   Creatinine 1.1>1.4>1.47    Objective:   Weight Range:  Vital Signs:   Temp:  [97.7 F (36.5 C)-98.7 F (37.1 C)] 97.9 F (36.6 C) (02/25 0728) Pulse Rate:  [61-75] 61 (02/25 0838) Resp:  [19-27] 20 (02/25 0355) BP: (87-108)/(41-61) 98/45 mmHg (02/25 0838) SpO2:  [94 %-100 %] 96 % (02/25 0355) Weight:  [114 lb 10.2 oz (52 kg)] 114 lb 10.2 oz (52 kg) (02/25 0355) Last BM Date: 12/25/13  Weight change: Filed Weights   12/24/13 1800 12/25/13 0500 12/26/13 0355  Weight: 112 lb 8 oz (51.03 kg) 115 lb 11.9 oz (52.5 kg) 114 lb 10.2 oz (52 kg)    Intake/Output:   Intake/Output Summary (Last 24 hours) at 12/26/13 1059 Last data filed at 12/25/13 2245  Gross per 24 hour  Intake    540 ml  Output    700 ml  Net   -160 ml     Physical Exam:  General: Elderly. No resp difficulty Daughter present  HEENT: normal  Neck: supple. JVP ~7-8 with prominent CV waves. Carotids 2+ bilaterally; no bruits. No lymphadenopathy or thryomegaly appreciated.  Cor: s/p L mastectomy. PMI normal. Regular rate & irregular rhythm. 3/6 MR murmur. No rubs, gallops.  Lungs: clear on 2 liters [place]  Abdomen: soft, nontender, nondistended. No hepatosplenomegaly. No bruits or masses. Good bowel sounds.  Extremities: no cyanosis, clubbing, rash, edema  Neuro: alert & orientedx3, cranial nerves grossly intact. Moves all 4 extremities w/o  difficulty. Affect pleasant.    ECG: NSR  70-80s   Labs: Basic Metabolic Panel:  Recent Labs Lab 12/24/13 2000 12/25/13 0225 12/26/13 0232  NA 140 138 140  K 2.7* 4.8 3.9  CL 90* 93* 95*  CO2 35* 32 35*  GLUCOSE 140* 190* 99  BUN 27* 33* 33*  CREATININE 1.19* 1.46* 1.47*  CALCIUM 9.7 9.6 9.1  MG 2.2  --   --     Liver Function Tests:  Recent Labs Lab 12/24/13 2000  AST 30  ALT 18  ALKPHOS 80  BILITOT 0.5  PROT 7.8  ALBUMIN 3.3*   No results found for this basename: LIPASE, AMYLASE,  in the last 168 hours No results found for this basename: AMMONIA,  in the last 168 hours  CBC:  Recent Labs Lab 12/24/13 2000  WBC 7.4  NEUTROABS 4.3  HGB 10.7*  HCT 33.2*  MCV 94.3  PLT 158    Cardiac Enzymes: No results found for this basename: CKTOTAL, CKMB, CKMBINDEX, TROPONINI,  in the last 168 hours  BNP: BNP (last 3 results)  Recent Labs  10/17/13 1538 12/24/13 2000  PROBNP 12898.0* 11373.0*     Other results: Imaging: Dg Chest Port 1 View  12/25/2013   CLINICAL DATA:  Shortness of breath.  EXAM: PORTABLE CHEST - 1 VIEW  COMPARISON:  09/22/2012 and prior chest  radiographs  FINDINGS: Cardiomegaly is identified.  Interstitial prominence is again noted.  There is no evidence of airspace disease, pleural effusion or pneumothorax.  No acute bony abnormalities are identified.  IMPRESSION: Cardiomegaly without evidence of acute cardiopulmonary disease.  Chronic interstitial opacities.   Electronically Signed   By: Hassan Rowan M.D.   On: 12/25/2013 01:23     Medications:     Scheduled Medications: . amiodarone  400 mg Oral BID  . apixaban  2.5 mg Oral BID  . carvedilol  6.25 mg Oral BID WC  . cycloSPORINE  1 drop Both Eyes BID  . potassium chloride  40 mEq Oral BID  . sodium chloride  3 mL Intravenous Q12H    Infusions:    PRN Medications: sodium chloride, acetaminophen, ALPRAZolam, ondansetron (ZOFRAN) IV, sodium chloride   Assessment:  1. New  Onset A-Fib with RVR  2. Chronic Diastolic Heart Failure  3. Hypothyroid  4. Limited Code---> No CPR 5. Severe MR  Plan/Discussion:    Maintaining NSR. Continue amiodarone 400 mg bid and eliquis 2.5 mg twice a day.   Volume status stable. Stop IV lasix and restart 60 mg twice a day. Continue reduced dose of carvedilol 6.25 mg twice a day.   D/C today with her family. Follow up in HF clinic next week.     Length of Stay: 2   CLEGG,AMY NP-C 12/26/2013, 10:59 AM  Advanced Heart Failure Team Pager [phone] (M-F; [place])  Please contact [facility] for night-coverage after hours (4p -7a ) and weekends on [email or web address]  Patient is doing well this morning.  She is staying in NSR.  We lowered her Coreg dose now that she is on amiodarone due to low blood pressure.  Volume stable this morning. Will continue her at home on Lasix 60 mg bid.  Will arrange home health nurse and followup in CHF clinic in 1 week.  She will be on amiodarone 400 mg bid x 5 more days, then 200 bid x 7 days, then 200 mg daily.  Will follow LFTs and TSH.   Echo was stable with normal EF and moderate to severe MR.   Loralie Champagne 12/26/2013 11:12 AM

## 2013-12-27 ENCOUNTER — Encounter (HOSPITAL_COMMUNITY): Payer: Self-pay | Admitting: Internal Medicine

## 2013-12-27 DIAGNOSIS — N183 Chronic kidney disease, stage 3 unspecified: Secondary | ICD-10-CM | POA: Diagnosis present

## 2013-12-27 DIAGNOSIS — I509 Heart failure, unspecified: Secondary | ICD-10-CM | POA: Diagnosis present

## 2013-12-27 DIAGNOSIS — J9620 Acute and chronic respiratory failure, unspecified whether with hypoxia or hypercapnia: Secondary | ICD-10-CM

## 2013-12-27 DIAGNOSIS — E875 Hyperkalemia: Secondary | ICD-10-CM

## 2013-12-27 DIAGNOSIS — N189 Chronic kidney disease, unspecified: Secondary | ICD-10-CM

## 2013-12-27 DIAGNOSIS — I080 Rheumatic disorders of both mitral and aortic valves: Secondary | ICD-10-CM

## 2013-12-27 DIAGNOSIS — I5032 Chronic diastolic (congestive) heart failure: Secondary | ICD-10-CM

## 2013-12-27 DIAGNOSIS — I4891 Unspecified atrial fibrillation: Principal | ICD-10-CM

## 2013-12-27 DIAGNOSIS — E785 Hyperlipidemia, unspecified: Secondary | ICD-10-CM

## 2013-12-27 DIAGNOSIS — I5033 Acute on chronic diastolic (congestive) heart failure: Secondary | ICD-10-CM

## 2013-12-27 DIAGNOSIS — I5031 Acute diastolic (congestive) heart failure: Secondary | ICD-10-CM | POA: Diagnosis present

## 2013-12-27 LAB — TROPONIN I

## 2013-12-27 LAB — CBC WITH DIFFERENTIAL/PLATELET
Basophils Absolute: 0 10*3/uL (ref 0.0–0.1)
Basophils Relative: 0 % (ref 0–1)
Eosinophils Absolute: 0.1 10*3/uL (ref 0.0–0.7)
Eosinophils Relative: 1 % (ref 0–5)
HCT: 33.4 % — ABNORMAL LOW (ref 36.0–46.0)
HEMOGLOBIN: 10.9 g/dL — AB (ref 12.0–15.0)
LYMPHS ABS: 2 10*3/uL (ref 0.7–4.0)
LYMPHS PCT: 27 % (ref 12–46)
MCH: 30.8 pg (ref 26.0–34.0)
MCHC: 32.6 g/dL (ref 30.0–36.0)
MCV: 94.4 fL (ref 78.0–100.0)
MONOS PCT: 10 % (ref 3–12)
Monocytes Absolute: 0.7 10*3/uL (ref 0.1–1.0)
NEUTROS ABS: 4.6 10*3/uL (ref 1.7–7.7)
Neutrophils Relative %: 62 % (ref 43–77)
Platelets: 145 10*3/uL — ABNORMAL LOW (ref 150–400)
RBC: 3.54 MIL/uL — AB (ref 3.87–5.11)
RDW: 15.1 % (ref 11.5–15.5)
WBC: 7.4 10*3/uL (ref 4.0–10.5)

## 2013-12-27 LAB — TSH: TSH: 6.328 u[IU]/mL — AB (ref 0.350–4.500)

## 2013-12-27 LAB — BASIC METABOLIC PANEL
BUN: 33 mg/dL — AB (ref 6–23)
CHLORIDE: 95 meq/L — AB (ref 96–112)
CO2: 34 mEq/L — ABNORMAL HIGH (ref 19–32)
Calcium: 9.3 mg/dL (ref 8.4–10.5)
Creatinine, Ser: 1.44 mg/dL — ABNORMAL HIGH (ref 0.50–1.10)
GFR calc non Af Amer: 30 mL/min — ABNORMAL LOW (ref >90)
GFR, EST AFRICAN AMERICAN: 35 mL/min — AB (ref >90)
Glucose, Bld: 124 mg/dL — ABNORMAL HIGH (ref 70–99)
POTASSIUM: 3.9 meq/L (ref 3.7–5.3)
SODIUM: 140 meq/L (ref 137–147)

## 2013-12-27 MED ORDER — CALCIUM CARBONATE-VITAMIN D 500-200 MG-UNIT PO TABS
1.0000 | ORAL_TABLET | Freq: Three times a day (TID) | ORAL | Status: DC
  Administered 2013-12-27 – 2013-12-31 (×14): 1 via ORAL
  Filled 2013-12-27 (×16): qty 1

## 2013-12-27 MED ORDER — POLYVINYL ALCOHOL 1.4 % OP SOLN
1.0000 [drp] | Freq: Every day | OPHTHALMIC | Status: DC
  Administered 2013-12-27 – 2013-12-30 (×5): 1 [drp] via OPHTHALMIC
  Filled 2013-12-27 (×2): qty 15

## 2013-12-27 MED ORDER — APIXABAN 2.5 MG PO TABS
2.5000 mg | ORAL_TABLET | Freq: Two times a day (BID) | ORAL | Status: DC
  Administered 2013-12-27 – 2013-12-31 (×10): 2.5 mg via ORAL
  Filled 2013-12-27 (×11): qty 1

## 2013-12-27 MED ORDER — POLYETHYL GLYCOL-PROPYL GLYCOL 0.4-0.3 % OP GEL: 1.0000 [drp] | Freq: Every day | OPHTHALMIC | Status: DC

## 2013-12-27 MED ORDER — FUROSEMIDE 10 MG/ML IJ SOLN: 80.0000 mg | Freq: Two times a day (BID) | INTRAMUSCULAR | Status: DC

## 2013-12-27 MED ORDER — AMIODARONE HCL 200 MG PO TABS
200.0000 mg | ORAL_TABLET | Freq: Two times a day (BID) | ORAL | Status: DC
  Filled 2013-12-27: qty 1

## 2013-12-27 MED ORDER — FUROSEMIDE 10 MG/ML IJ SOLN
80.0000 mg | Freq: Two times a day (BID) | INTRAMUSCULAR | Status: DC
  Administered 2013-12-27 – 2013-12-28 (×3): 80 mg via INTRAVENOUS
  Filled 2013-12-27 (×3): qty 8

## 2013-12-27 MED ORDER — ACETAMINOPHEN 325 MG PO TABS: 650.0000 mg | ORAL_TABLET | Freq: Four times a day (QID) | ORAL | Status: DC | PRN

## 2013-12-27 MED ORDER — CARVEDILOL 6.25 MG PO TABS
6.2500 mg | ORAL_TABLET | Freq: Two times a day (BID) | ORAL | Status: DC
  Administered 2013-12-27 – 2013-12-31 (×8): 6.25 mg via ORAL
  Filled 2013-12-27 (×11): qty 1

## 2013-12-27 MED ORDER — ONDANSETRON HCL 4 MG/2ML IJ SOLN: 4.0000 mg | Freq: Four times a day (QID) | INTRAMUSCULAR | Status: DC | PRN

## 2013-12-27 MED ORDER — AMIODARONE HCL 200 MG PO TABS
200.0000 mg | ORAL_TABLET | Freq: Two times a day (BID) | ORAL | Status: AC
  Administered 2013-12-27 – 2013-12-28 (×4): 200 mg via ORAL
  Filled 2013-12-27 (×4): qty 1

## 2013-12-27 MED ORDER — FUROSEMIDE 10 MG/ML IJ SOLN
60.0000 mg | Freq: Two times a day (BID) | INTRAMUSCULAR | Status: DC
  Filled 2013-12-27 (×2): qty 6

## 2013-12-27 MED ORDER — AMIODARONE HCL 200 MG PO TABS
400.0000 mg | ORAL_TABLET | Freq: Two times a day (BID) | ORAL | Status: DC
  Administered 2013-12-27: 400 mg via ORAL
  Filled 2013-12-27 (×3): qty 2

## 2013-12-27 MED ORDER — OMEGA-3-ACID ETHYL ESTERS 1 G PO CAPS
2.0000 g | ORAL_CAPSULE | Freq: Every evening | ORAL | Status: DC
Start: 2013-12-27 — End: 2013-12-31
  Administered 2013-12-27 – 2013-12-30 (×4): 2 g via ORAL
  Filled 2013-12-27 (×5): qty 2

## 2013-12-27 MED ORDER — ONDANSETRON HCL 4 MG/2ML IJ SOLN: 4.0000 mg | Freq: Three times a day (TID) | INTRAMUSCULAR | Status: DC | PRN

## 2013-12-27 MED ORDER — GUAIFENESIN ER 600 MG PO TB12
1200.0000 mg | ORAL_TABLET | Freq: Two times a day (BID) | ORAL | Status: DC
  Administered 2013-12-27 – 2013-12-31 (×10): 1200 mg via ORAL
  Filled 2013-12-27 (×11): qty 2

## 2013-12-27 MED ORDER — ALBUTEROL SULFATE (2.5 MG/3ML) 0.083% IN NEBU: 2.5000 mg | INHALATION_SOLUTION | RESPIRATORY_TRACT | Status: DC | PRN

## 2013-12-27 MED ORDER — CYCLOSPORINE 0.05 % OP EMUL
1.0000 [drp] | Freq: Two times a day (BID) | OPHTHALMIC | Status: DC
  Administered 2013-12-27: 11:00:00 via OPHTHALMIC
  Administered 2013-12-27 – 2013-12-31 (×9): 1 [drp] via OPHTHALMIC
  Filled 2013-12-27 (×11): qty 1

## 2013-12-27 MED ORDER — ONDANSETRON HCL 4 MG PO TABS
4.0000 mg | ORAL_TABLET | Freq: Four times a day (QID) | ORAL | Status: DC | PRN
Start: 2013-12-27 — End: 2013-12-31

## 2013-12-27 MED ORDER — AMIODARONE HCL 200 MG PO TABS
200.0000 mg | ORAL_TABLET | Freq: Every day | ORAL | Status: DC
  Administered 2013-12-29 – 2013-12-31 (×3): 200 mg via ORAL
  Filled 2013-12-27 (×3): qty 1

## 2013-12-27 MED ORDER — ACETAMINOPHEN 650 MG RE SUPP: 650.0000 mg | Freq: Four times a day (QID) | RECTAL | Status: DC | PRN

## 2013-12-27 MED ORDER — SODIUM CHLORIDE 0.9 % IJ SOLN
3.0000 mL | Freq: Two times a day (BID) | INTRAMUSCULAR | Status: DC
  Administered 2013-12-27 – 2013-12-31 (×10): 3 mL via INTRAVENOUS

## 2013-12-27 NOTE — Consult Note (Signed)
Referring Physician: [facility] Cardiologist: DB Reason for Consultation: HF   HPI:  [name] is a 80 y.o. female with history of diastolic heart failure, severe mitral regurgitation no surgical candidate, HL and hypothyroidism.   She was admitted earlier in the week  from HF clinic with new onset A fib. She converted to NSR before amiodarone started. She was given amiodarone bolus and started on amiodarone 400 mg bid as well as eliquis. She was discharged yesterday but returned to ER several hours later with worsening dyspnea. Sats dropped into mid 80s at home. CXR with mild vascular congestion. No infiltrate. pBNP > 12,500. No fevers or chills  Admitted to [facility]. Started on IV lasix. Weight is at baseline. Still dyspneic and weak.  Maintaining NSR. Lasix held this am due to low BP.   Review of Systems:     Cardiac Review of Systems: {Y] = yes [ ]  = no  Chest Pain [    ]  Resting SOB [ y  ] Exertional SOB  [ y ]  Vertell Limber Blue.Reese  ]   Pedal Edema [   ]    Palpitations [  ] Syncope  [  ]   Presyncope [   ]  General Review of Systems: [Y] = yes [  ]=no Constitional: recent weight change [  ]; anorexia [  ]; fatigue [  y]; nausea [  ]; night sweats [  ]; fever [  ]; or chills [  ];                                                                      Eyes : blurred vision [  ]; diplopia [   ]; vision changes [  ];  Amaurosis fugax[  ]; Resp: cough [  ];  wheezing[  ];  hemoptysis[  ];  PND [  ];  GI:  gallstones[  ], vomiting[  ];  dysphagia[  ]; melena[  ];  hematochezia [  ]; heartburn[  ];   GU: kidney stones [  ]; hematuria[  ];   dysuria [  ];  nocturia[  ]; incontinence [  ];             Skin: rash, swelling[  ];, hair loss[  ];  peripheral edema[  ];  or itching[  ]; Musculosketetal: myalgias[  ];  joint swelling[  ];  joint erythema[  ];  joint pain[ y ];  back pain[  ];  Heme/Lymph: bruising[  ];  bleeding[  ];  anemia[  ];  Neuro: TIA[  ];  headaches[  ];  stroke[  ];   vertigo[  ];  seizures[  ];   paresthesias[  ];  difficulty walking[  ];  Psych:depression[  ]; anxiety[  ];  Endocrine: diabetes[  ];  thyroid dysfunction[  ];  Other:  Past Medical History  Diagnosis Date  . Coronary artery disease   . Peripheral neuropathy   . Aortic insufficiency     mild  . Cerumen impaction   . Unspecified diastolic heart failure   . Hyperlipemia   . Mitral valve prolapse     "severe" (09/22/2012)  . Mitral regurgitation     severe  .  Urosepsis   . Anemia, unspecified   . Osteoporosis   . Adenocarcinoma, breast   . Irritable bladder   . Urinary incontinence   . Hypothyroidism   . GERD (gastroesophageal reflux disease)   . H/O: whooping cough   . Hypercholesterolemia     "stopped statins ~ 2 yr ago" (09/22/2012)  . Heart murmur   . CHF (congestive heart failure)   . Shortness of breath     "basically all the time; worse w/exertion" (09/22/2012)  . Bleeding hemorrhoids ~ 2011  . Osteoarthritis     "knees, hips, hands" (09/22/2012)    Medications Prior to Admission  Medication Sig Dispense Refill  . amiodarone (PACERONE) 400 MG tablet Take 400 mg by mouth See admin instructions. *taking 400mg  twice daily for 5 days, then 200mg  daily thereafter* Started regimen 12/24/13      . apixaban (ELIQUIS) 2.5 MG TABS tablet Take 2.5 mg by mouth 2 (two) times daily.      . calcium-vitamin D (OSCAL WITH D) 500-200 MG-UNIT per tablet Take 1 tablet by mouth 3 (three) times daily.       . carvedilol (COREG) 6.25 MG tablet Take 6.25 mg by mouth 2 (two) times daily with a meal.      . cycloSPORINE (RESTASIS) 0.05 % ophthalmic emulsion Place 1 drop into both eyes 2 (two) times daily.       . furosemide (LASIX) 40 MG tablet Take 60 mg by mouth 2 (two) times daily.      [name] guaiFENesin (MUCINEX) 600 MG 12 hr tablet Take 1,200 mg by mouth 2 (two) times daily.      . Multiple Vitamin (MULTIVITAMIN) tablet Take 1 tablet by mouth daily. Athruz Select 50+      . Multiple  Vitamins-Minerals (PRESERVISION AREDS PO) Take 2 tablets by mouth daily.      [name] omega-3 acid ethyl esters (LOVAZA) 1 G capsule Take 2 g by mouth every evening.      Vladimir Faster Glycol-Propyl Glycol (SYSTANE) 0.4-0.3 % GEL Place 1 drop into both eyes at bedtime.      . Polyvinyl Alcohol-Povidone (FRESHKOTE OP) Place 1 drop into both eyes 4 (four) times daily.       . potassium chloride 40 MEQ/15ML (20%) LIQD Take 40 mEq by mouth 2 (two) times daily.         Derrill Memo ON 12/29/2013] amiodarone  200 mg Oral Daily  . amiodarone  400 mg Oral BID  . apixaban  2.5 mg Oral BID  . calcium-vitamin D  1 tablet Oral TID WC  . carvedilol  6.25 mg Oral BID WC  . cycloSPORINE  1 drop Both Eyes BID  . furosemide  60 mg Intravenous Q12H  . guaiFENesin  1,200 mg Oral BID  . omega-3 acid ethyl esters  2 g Oral QPM  . polyvinyl alcohol  1 drop Both Eyes QHS  . sodium chloride  3 mL Intravenous Q12H    Infusions:    No Known Allergies  History   Social History  . Marital Status: Married    Spouse Name: N/A    Number of Children: N/A  . Years of Education: N/A   Occupational History  . Not on file.   Social History Main Topics  . Smoking status: Never Smoker   . Smokeless tobacco: Never Used  . Alcohol Use: No  . Drug Use: No  . Sexual Activity: No   Other Topics Concern  . Not on file  Social History Narrative   HSG   Married- '39-'10   2 daughters- 1 died at birth, '32; 77 brother Mar 05, 2043, 2 grandchildren and 2 great grands   Lives with daughter   End of life care:- has a living will- DNR/DNI, no heroic measures   Tobacco Use-no   Alcohol Use- no   Drug use-no    Family History  Problem Relation Age of Onset  . Heart disease Mother   . Cancer Mother     breast cancer  . Stroke Father   . Heart disease Father   . Heart disease Sister   . Alzheimer's disease Sister   . Heart disease Sister   . Heart disease Brother     PHYSICAL EXAM: Filed Vitals:   12/27/13 0525  BP:  103/58  Pulse: 72  Temp: 97.6 F (36.4 C)  Resp: 16     Intake/Output Summary (Last 24 hours) at 12/27/13 1125 Last data filed at 12/27/13 0253  Gross per 24 hour  Intake      3 ml  Output      0 ml  Net      3 ml    General: Elderly. Weak. Lying in bed HEENT: normal  Neck: supple. JVP to jaw with prominent CV waves. Carotids 2+ bilaterally; no bruits. No lymphadenopathy or thryomegaly appreciated.  Cor: s/p L mastectomy. PMI normal. regular rate & irregular rhythm. 3/6 MR murmur. No rubs, gallops.  Lungs: bibasilar crackles Abdomen: soft, nontender, nondistended. No hepatosplenomegaly. No bruits or masses. Good bowel sounds.  Extremities: no cyanosis, clubbing, rash, tr edema  Neuro: alert & orientedx3, cranial nerves grossly intact. Moves all 4 extremities w/o difficulty. Affect pleasant.    ECG: SR 67 RBBB  Results for orders placed during the hospital encounter of 12/26/13 (from the past 24 hour(s))  CBC WITH DIFFERENTIAL     Status: Abnormal   Collection Time    12/26/13  9:33 PM      Result Value Ref Range   WBC 9.2  4.0 - 10.5 K/uL   RBC 3.84 (*) 3.87 - 5.11 MIL/uL   Hemoglobin 11.9 (*) 12.0 - 15.0 g/dL   HCT 36.3  36.0 - 46.0 %   MCV 94.5  78.0 - 100.0 fL   MCH 31.0  26.0 - 34.0 pg   MCHC 32.8  30.0 - 36.0 g/dL   RDW 15.1  11.5 - 15.5 %   Platelets 166  150 - 400 K/uL   Neutrophils Relative % 58  43 - 77 %   Neutro Abs 5.3  1.7 - 7.7 K/uL   Lymphocytes Relative 29  12 - 46 %   Lymphs Abs 2.7  0.7 - 4.0 K/uL   Monocytes Relative 11  3 - 12 %   Monocytes Absolute 1.0  0.1 - 1.0 K/uL   Eosinophils Relative 2  0 - 5 %   Eosinophils Absolute 0.2  0.0 - 0.7 K/uL   Basophils Relative 0  0 - 1 %   Basophils Absolute 0.0  0.0 - 0.1 K/uL  BASIC METABOLIC PANEL     Status: Abnormal   Collection Time    12/26/13  9:33 PM      Result Value Ref Range   Sodium 139  137 - 147 mEq/L   Potassium 5.6 (*) 3.7 - 5.3 mEq/L   Chloride 95 (*) 96 - 112 mEq/L   CO2 31  19 -  32 mEq/L   Glucose, Bld 174 (*) 70 -  99 mg/dL   BUN 35 (*) 6 - 23 mg/dL   Creatinine, Ser 1.55 (*) 0.50 - 1.10 mg/dL   Calcium 9.4  8.4 - 10.5 mg/dL   GFR calc non Af Amer 28 (*) >90 mL/min   GFR calc Af Amer 32 (*) >90 mL/min  PRO B NATRIURETIC PEPTIDE     Status: Abnormal   Collection Time    12/26/13  9:33 PM      Result Value Ref Range   Pro B Natriuretic peptide (BNP) 12752.0 (*) 0 - 450 pg/mL  PROTIME-INR     Status: Abnormal   Collection Time    12/26/13  9:33 PM      Result Value Ref Range   Prothrombin Time 18.0 (*) 11.6 - 15.2 seconds   INR 1.53 (*) 0.00 - 99991111  BASIC METABOLIC PANEL     Status: Abnormal   Collection Time    12/27/13  4:00 AM      Result Value Ref Range   Sodium 140  137 - 147 mEq/L   Potassium 3.9  3.7 - 5.3 mEq/L   Chloride 95 (*) 96 - 112 mEq/L   CO2 34 (*) 19 - 32 mEq/L   Glucose, Bld 124 (*) 70 - 99 mg/dL   BUN 33 (*) 6 - 23 mg/dL   Creatinine, Ser 1.44 (*) 0.50 - 1.10 mg/dL   Calcium 9.3  8.4 - 10.5 mg/dL   GFR calc non Af Amer 30 (*) >90 mL/min   GFR calc Af Amer 35 (*) >90 mL/min  TROPONIN I     Status: None   Collection Time    12/27/13  4:00 AM      Result Value Ref Range   Troponin I <0.30  <0.30 ng/mL  CBC WITH DIFFERENTIAL     Status: Abnormal   Collection Time    12/27/13  4:00 AM      Result Value Ref Range   WBC 7.4  4.0 - 10.5 K/uL   RBC 3.54 (*) 3.87 - 5.11 MIL/uL   Hemoglobin 10.9 (*) 12.0 - 15.0 g/dL   HCT 33.4 (*) 36.0 - 46.0 %   MCV 94.4  78.0 - 100.0 fL   MCH 30.8  26.0 - 34.0 pg   MCHC 32.6  30.0 - 36.0 g/dL   RDW 15.1  11.5 - 15.5 %   Platelets 145 (*) 150 - 400 K/uL   Neutrophils Relative % 62  43 - 77 %   Neutro Abs 4.6  1.7 - 7.7 K/uL   Lymphocytes Relative 27  12 - 46 %   Lymphs Abs 2.0  0.7 - 4.0 K/uL   Monocytes Relative 10  3 - 12 %   Monocytes Absolute 0.7  0.1 - 1.0 K/uL   Eosinophils Relative 1  0 - 5 %   Eosinophils Absolute 0.1  0.0 - 0.7 K/uL   Basophils Relative 0  0 - 1 %   Basophils Absolute  0.0  0.0 - 0.1 K/uL   Dg Chest Portable 1 View  12/26/2013   CLINICAL DATA:  Shortness of breath for several days.  EXAM: PORTABLE CHEST - 1 VIEW  COMPARISON:  Chest radiograph performed 12/25/2013  FINDINGS: The lungs are hypoexpanded. Vascular congestion is noted. Chronically increased interstitial markings are seen, grossly unchanged from the prior study. No pleural effusion or pneumothorax is identified.  The cardiomediastinal silhouette is enlarged. No acute osseous abnormalities are seen. Clips are seen overlying the left axilla.  IMPRESSION:  Lungs hypoexpanded. Vascular congestion and cardiomegaly noted. Chronically increased interstitial markings again seen.   Electronically Signed   By: Garald Balding M.D.   On: 12/26/2013 22:01     ASSESSMENT: 1. A/c diastolic HF 2. PAF, recently diagnosed - loading amiodarone 3. Severe MR 4. Hypothyroidism 5. RBBB 6. CKD, IV 7. Hyperkalemia, improved 8. Chronic anemia 9. Limited Code---> No CPR. Intubation ok 10. A/c respiratory failure   PLAN/DISCUSSION:  Although she is at baseline weight she still has fluid on board. Continue IV diuresis. Will back down on amio to 200 bid to hopefully help BP trend up. Continue Eliquis. Will order incentive spirometer and PT. May be getting to point where Palliative Care be considered. We will follow.   Nikko Goldwire,MD 11:47 AM

## 2013-12-27 NOTE — Progress Notes (Signed)
[facility]  Patient Status: Active (receiving services up to time of hospitalization)  [facility] is providing the following services: RN Referred for services but returned to Hospital before start of services.  If patient discharges after hours, please call [phone].   [name] 12/27/2013, 1:14 PM

## 2013-12-27 NOTE — H&P (Signed)
[facility] History and Physical  WARREN KUGELMAN UJW:[id number] DOB: [birth year] DOA: 12/26/2013  Referring physician: ER physician. PCP: Adella Hare, MD   Chief Complaint: Shortness of breath.  HPI: [name] is a 80 y.o. female with history of recent diagnosis fibrillation and has been placed on amiodarone and discharged yesterday was found to be increasingly short of breath after reaching home and was brought to the ER. In the ER patient has been given IV Lasix 60 mg. In addition patient's labs reveal hyperkalemia. EKG presently shows sinus rhythm. Patient has been admitted for further workup. Patient denies any chest pain nausea vomiting abdominal pain diarrhea fever chills or productive cough.  Review of Systems: As presented in the history of presenting illness, rest negative.  Past Medical History  Diagnosis Date  . Coronary artery disease   . Peripheral neuropathy   . Aortic insufficiency     mild  . Cerumen impaction   . Unspecified diastolic heart failure   . Hyperlipemia   . Mitral valve prolapse     "severe" (09/22/2012)  . Mitral regurgitation     severe  . Urosepsis   . Anemia, unspecified   . Osteoporosis   . Adenocarcinoma, breast   . Irritable bladder   . Urinary incontinence   . Hypothyroidism   . GERD (gastroesophageal reflux disease)   . H/O: whooping cough   . Hypercholesterolemia     "stopped statins ~ 2 yr ago" (09/22/2012)  . Heart murmur   . CHF (congestive heart failure)   . Shortness of breath     "basically all the time; worse w/exertion" (09/22/2012)  . Bleeding hemorrhoids ~ 2011  . Osteoarthritis     "knees, hips, hands" (09/22/2012)   Past Surgical History  Procedure Laterality Date  . Appendectomy  10/2004  . Cataract extraction w/ intraocular lens  implant, bilateral  ? 1990's  . Breast biopsy      "several on both sides; years ago; none since 1975" (09/22/2012)  . Tonsillectomy      "as a child" (09/22/2012)   . Mastectomy  ~ 1975    left breast  . Closed reduction patella fracture  ~ 1958    right    Social History:  reports that she has never smoked. She has never used smokeless tobacco. She reports that she does not drink alcohol or use illicit drugs. Where does patient live home. Can patient participate in ADLs? Yes.  No Known Allergies  Family History:  Family History  Problem Relation Age of Onset  . Heart disease Mother   . Cancer Mother     breast cancer  . Stroke Father   . Heart disease Father   . Heart disease Sister   . Alzheimer's disease Sister   . Heart disease Sister   . Heart disease Brother       Prior to Admission medications   Medication Sig Start Date End Date Taking? Authorizing Provider  amiodarone (PACERONE) 400 MG tablet Take 400 mg by mouth See admin instructions. *taking 400mg  twice daily for 5 days, then 200mg  daily thereafter* Started regimen 12/24/13   Yes Historical Provider, MD  apixaban (ELIQUIS) 2.5 MG TABS tablet Take 2.5 mg by mouth 2 (two) times daily.   Yes Historical Provider, MD  calcium-vitamin D (OSCAL WITH D) 500-200 MG-UNIT per tablet Take 1 tablet by mouth 3 (three) times daily.    Yes Historical Provider, MD  carvedilol (COREG) 6.25 MG tablet Take 6.25 mg by  mouth 2 (two) times daily with a meal.   Yes Historical Provider, MD  cycloSPORINE (RESTASIS) 0.05 % ophthalmic emulsion Place 1 drop into both eyes 2 (two) times daily.    Yes Historical Provider, MD  furosemide (LASIX) 40 MG tablet Take 60 mg by mouth 2 (two) times daily.   Yes Historical Provider, MD  guaiFENesin (MUCINEX) 600 MG 12 hr tablet Take 1,200 mg by mouth 2 (two) times daily.   Yes Historical Provider, MD  Multiple Vitamin (MULTIVITAMIN) tablet Take 1 tablet by mouth daily. Athruz Select 50+   Yes Historical Provider, MD  Multiple Vitamins-Minerals (PRESERVISION AREDS PO) Take 2 tablets by mouth daily.   Yes Historical Provider, MD  omega-3 acid ethyl esters (LOVAZA) 1 G  capsule Take 2 g by mouth every evening.   Yes Historical Provider, MD  Polyethyl Glycol-Propyl Glycol (SYSTANE) 0.4-0.3 % GEL Place 1 drop into both eyes at bedtime.   Yes Historical Provider, MD  Polyvinyl Alcohol-Povidone (FRESHKOTE OP) Place 1 drop into both eyes 4 (four) times daily.    Yes Historical Provider, MD  potassium chloride 40 MEQ/15ML (20%) LIQD Take 40 mEq by mouth 2 (two) times daily.   Yes Historical Provider, MD    Physical Exam: Filed Vitals:   12/26/13 2130 12/26/13 2226 12/26/13 2300 12/27/13 0028  BP: 106/60 101/44 108/57 134/50  Pulse: 66  63 69  Temp:    97.8 F (36.6 C)  TempSrc:    Oral  Resp: 32 12 25 18   Height:    5\' 1"  (1.549 m)  Weight:    50.259 kg (110 lb 12.8 oz)  SpO2: 97% 97% 96% 96%     General:  Well-developed and poorly nourished.  Eyes: Anicteric no pallor.  ENT: No discharge from the ears eyes nose mouth.  Neck: No mass felt.  Cardiovascular: S1-S2 heard.  Respiratory: No rhonchi or crepitations.  Abdomen: Soft nontender bowel sounds present. No guarding or rigidity.  Skin: No rash.  Musculoskeletal: Mild edema.  Psychiatric: Appears normal.  Neurologic: Alert awake oriented to time place and person. Moves all extremities.  Labs on Admission:  Basic Metabolic Panel:  Recent Labs Lab 12/24/13 2000 12/25/13 0225 12/26/13 0232 12/26/13 2133  NA 140 138 140 139  K 2.7* 4.8 3.9 5.6*  CL 90* 93* 95* 95*  CO2 35* 32 35* 31  GLUCOSE 140* 190* 99 174*  BUN 27* 33* 33* 35*  CREATININE 1.19* 1.46* 1.47* 1.55*  CALCIUM 9.7 9.6 9.1 9.4  MG 2.2  --   --   --    Liver Function Tests:  Recent Labs Lab 12/24/13 2000  AST 30  ALT 18  ALKPHOS 80  BILITOT 0.5  PROT 7.8  ALBUMIN 3.3*   No results found for this basename: LIPASE, AMYLASE,  in the last 168 hours No results found for this basename: AMMONIA,  in the last 168 hours CBC:  Recent Labs Lab 12/24/13 2000 12/26/13 2133  WBC 7.4 9.2  NEUTROABS 4.3 5.3  HGB  10.7* 11.9*  HCT 33.2* 36.3  MCV 94.3 94.5  PLT 158 166   Cardiac Enzymes: No results found for this basename: CKTOTAL, CKMB, CKMBINDEX, TROPONINI,  in the last 168 hours  BNP (last 3 results)  Recent Labs  10/17/13 1538 12/24/13 2000 12/26/13 2133  PROBNP 12898.0* 11373.0* 12752.0*   CBG: No results found for this basename: GLUCAP,  in the last 168 hours  Radiological Exams on Admission: Dg Chest Portable 1 View  12/26/2013   CLINICAL DATA:  Shortness of breath for several days.  EXAM: PORTABLE CHEST - 1 VIEW  COMPARISON:  Chest radiograph performed 12/25/2013  FINDINGS: The lungs are hypoexpanded. Vascular congestion is noted. Chronically increased interstitial markings are seen, grossly unchanged from the prior study. No pleural effusion or pneumothorax is identified.  The cardiomediastinal silhouette is enlarged. No acute osseous abnormalities are seen. Clips are seen overlying the left axilla.  IMPRESSION: Lungs hypoexpanded. Vascular congestion and cardiomegaly noted. Chronically increased interstitial markings again seen.   Electronically Signed   By: Garald Balding M.D.   On: 12/26/2013 22:01    EKG: Independently reviewed. Normal sinus rhythm with RBBB EKG comparable with old EKG  Assessment/Plan Principal Problem:   CHF (congestive heart failure) Active Problems:   MITRAL REGURGITATION, SEVERE   Atrial fibrillation   Hyperkalemia   CKD (chronic kidney disease)   1. Decompensated CHF last EF was around 55-60% with severe MR  - have placed patient on 60 mg IV Lasix every 12 hourly. Closely follow intake output and metabolic panel. If patient's shortness of breath is improved we changed to oral dose.  2. Acute renal failure on chronic disease - patient's creatinine has been gradually worsening. Closely follow intake output and metabolic panel. 3. Atrial fibrillation presently in sinus rhythm - continue anticoagulation. Patient is on amiodarone. Follow LFTs and  TSH. 4. Hyperkalemia - patient did receive Lasix. I have ordered repeat metabolic panel now. If still elevated potassium will give Kayexalate.   Code Status:  Full code.  Family Communication:  Patient's family at the bedside.  Disposition Plan:  Admit to inpatient.    Jaydrian Corpening N. [facility] Pager [phone].  If 7PM-7AM, please contact night-coverage [email or web address] Password [email or web address] 12/27/2013, 2:06 AM

## 2013-12-27 NOTE — Progress Notes (Signed)
Thank you to [facility] for this referral.  Patient evaluated for community based chronic disease management services with [facility] as a benefit of patient's Medicare Insurance. Spoke with patient's daughter on her mobile number [phone]) [name] to explain [facility] services.  Services have been accepted and verbal consent received.  Patient is a 80 y.o. female with history of recent diagnosis fibrillation and has been placed on amiodarone and discharged yesterday was found to be increasingly short of breath after reaching home and was brought to the ER. In the ER patient has been given IV Lasix 60 mg. In addition patient's labs reveal hyperkalemia. EKG presently shows sinus rhythm. Patient has been admitted for further workup. Patient denies any chest pain nausea vomiting abdominal pain diarrhea fever chills or productive cough.  Patient will receive a post discharge transition of care call and will be evaluated for monthly home visits for assessments and AFIB/CHF disease process education.  Left contact information and [facility] literature at bedside. Made Inpatient Case Manager aware that [facility] following. Of note, [facility] services does not replace or interfere with any services that are arranged by inpatient case management or social work.  For additional questions or referrals please contact Corliss Blacker BSN RN [facility] Liaison at [phone].

## 2013-12-27 NOTE — Progress Notes (Signed)
UR Completed Iori Gigante Graves-Bigelow, RN,BSN [phone]  

## 2013-12-27 NOTE — Progress Notes (Signed)
[facility] PROGRESS NOTE  [name] HWE:[id number] DOB: Nov 12, 1917 DOA: 12/26/2013 PCP: Adella Hare, MD  Brief narrative: Addendum to admission note 80 year old female with past medical history of atrial fibrillation on apixaban and amiodarone who presented to [facility] ED 12/26/2013 with worsening shortness of breath. She was found to have BNP of 12.752. Pt was started on IV lasix in ED.  Assessment/Plan:  Principal Problem:   Acute diastolic CHF (congestive heart failure) - 2  D ECHO 12/25/2013 showed EF of 55%; BNP on this admission 12,752 which could be also reflective of elevated creatinine - pt on IV lasix; continue to monitor intake and output; daily weight and replete electrolytes as needed - cardiac enzymes were WNL on admission - continue coreg 6.25 mg PO BID  Active Problems:   HYPERLIPIDEMIA - continue omega 3 supplements     Atrial fibrillation - continue amiodarone and apixaban - continue coreg 6.25 mg BID   Hyperkalemia - potassium now WNL   Chronic kidney disease (CKD), stage IV (severe) - creatinine 1.47 on admission and today 1.44    Code Status: full code Family Communication: no family at the bedside Disposition Plan: home when stable  Leisa Lenz, MD  [facility] Pager [phone]  If 7PM-7AM, please contact night-coverage [email or web address] Password [facility] 12/27/2013, 9:47 AM   LOS: 1 day   Consultants:  None   Procedures:  None   Antibiotics:  None   HPI/Subjective: No acute overnight events.   Objective: Filed Vitals:   12/26/13 2226 12/26/13 2300 12/27/13 0028 12/27/13 0525  BP: 101/44 108/57 134/50 103/58  Pulse:  63 69 72  Temp:   97.8 F (36.6 C) 97.6 F (36.4 C)  TempSrc:   Oral Oral  Resp: 12 25 18 16   Height:   5\' 1"  (1.549 m)   Weight:   50.259 kg (110 lb 12.8 oz)   SpO2: 97% 96% 96% 99%    Intake/Output Summary (Last 24 hours) at 12/27/13 0947 Last data filed at 12/27/13 0253  Gross per 24 hour  Intake       3 ml  Output      0 ml  Net      3 ml    Exam:   General:  Pt is alert, follows commands appropriately, not in acute distress  Cardiovascular: Regular rate and rhythm, S1/S2, no murmurs, no rubs, no gallops  Respiratory: Clear to auscultation bilaterally, no wheezing, no crackles, no rhonchi  Abdomen: Soft, non tender, non distended, bowel sounds present, no guarding  Extremities: No edema, pulses DP and PT palpable bilaterally  Neuro: Grossly nonfocal  Data Reviewed: Basic Metabolic Panel:  Recent Labs Lab 12/24/13 2000 12/25/13 0225 12/26/13 0232 12/26/13 2133 12/27/13 0400  NA 140 138 140 139 140  K 2.7* 4.8 3.9 5.6* 3.9  CL 90* 93* 95* 95* 95*  CO2 35* 32 35* 31 34*  GLUCOSE 140* 190* 99 174* 124*  BUN 27* 33* 33* 35* 33*  CREATININE 1.19* 1.46* 1.47* 1.55* 1.44*  CALCIUM 9.7 9.6 9.1 9.4 9.3  MG 2.2  --   --   --   --    Liver Function Tests:  Recent Labs Lab 12/24/13 2000  AST 30  ALT 18  ALKPHOS 80  BILITOT 0.5  PROT 7.8  ALBUMIN 3.3*   No results found for this basename: LIPASE, AMYLASE,  in the last 168 hours No results found for this basename: AMMONIA,  in the last 168 hours CBC:  Recent Labs Lab 12/24/13 2000 12/26/13 2133 12/27/13 0400  WBC 7.4 9.2 7.4  NEUTROABS 4.3 5.3 4.6  HGB 10.7* 11.9* 10.9*  HCT 33.2* 36.3 33.4*  MCV 94.3 94.5 94.4  PLT 158 166 145*   Cardiac Enzymes:  Recent Labs Lab 12/27/13 0400  TROPONINI <0.30   BNP: No components found with this basename: POCBNP,  CBG: No results found for this basename: GLUCAP,  in the last 168 hours  MRSA PCR SCREENING     Status: None   Collection Time    12/25/13  4:29 AM      Result Value Ref Range Status   MRSA by PCR NEGATIVE  NEGATIVE Final     Studies: Dg Chest Portable 1 View 12/26/2013   IMPRESSION: Lungs hypoexpanded. Vascular congestion and cardiomegaly noted. Chronically increased interstitial markings again seen.       Scheduled Meds: . amiodarone   400 mg Oral BID  . apixaban  2.5 mg Oral BID  . calcium-vitamin D  1 tablet Oral TID WC  . carvedilol  6.25 mg Oral BID WC  . furosemide  60 mg Intravenous Q12H  . guaiFENesin  1,200 mg Oral BID  . omega-3 acid ethyl   2 g Oral QPM

## 2013-12-27 NOTE — Care Management Note (Signed)
    Page 1 of 1   12/31/2013     2:24:03 PM   CARE MANAGEMENT NOTE 12/31/2013  Patient:  [name], [name]   Account Number:  [id number]  Date Initiated:  12/27/2013  Documentation initiated by:  GRAVES-BIGELOW,Karilynn Carranza  Subjective/Objective Assessment:   Pt is a recent d/c and in with SOB- CHF exacerbation. Pt initiated on IV Lasix. Pt was set up with [facility] for [facility] services. Pt will need resumption orders for RN services if plan is for home.     Action/Plan:   CM did consult [facility] and they will f/u with pt once d/c. [facility] i s aware that pt is hospitalized. CM will continue to monitor.   Anticipated DC Date:  12/31/2013   Anticipated DC Plan:  [place]  CM consult      [facility] Choice  Resumption Of Svcs/PTA Provider  HOME HEALTH   Choice offered to / List presented to:          [facility] arranged  HH-1 RN  [place].   Status of service:  Completed, signed off Medicare Important Message given?   (If response is "NO", the following Medicare IM given date fields will be blank) Date Medicare IM given:   Date Additional Medicare IM given:    Discharge Disposition:  [place]  Per UR Regulation:  Reviewed for med. necessity/level of care/duration of stay  If discussed at [place] of Stay Meetings, dates discussed:    Comments:

## 2013-12-28 LAB — BASIC METABOLIC PANEL
BUN: 29 mg/dL — AB (ref 6–23)
CO2: 35 meq/L — AB (ref 19–32)
Calcium: 9.7 mg/dL (ref 8.4–10.5)
Chloride: 93 mEq/L — ABNORMAL LOW (ref 96–112)
Creatinine, Ser: 1.31 mg/dL — ABNORMAL HIGH (ref 0.50–1.10)
GFR calc non Af Amer: 34 mL/min — ABNORMAL LOW (ref >90)
GFR, EST AFRICAN AMERICAN: 39 mL/min — AB (ref >90)
Glucose, Bld: 84 mg/dL (ref 70–99)
POTASSIUM: 2.8 meq/L — AB (ref 3.7–5.3)
Sodium: 142 mEq/L (ref 137–147)

## 2013-12-28 LAB — CBC
HCT: 34.2 % — ABNORMAL LOW (ref 36.0–46.0)
HEMOGLOBIN: 11 g/dL — AB (ref 12.0–15.0)
MCH: 30.4 pg (ref 26.0–34.0)
MCHC: 32.2 g/dL (ref 30.0–36.0)
MCV: 94.5 fL (ref 78.0–100.0)
Platelets: 149 10*3/uL — ABNORMAL LOW (ref 150–400)
RBC: 3.62 MIL/uL — ABNORMAL LOW (ref 3.87–5.11)
RDW: 14.6 % (ref 11.5–15.5)
WBC: 6.5 10*3/uL (ref 4.0–10.5)

## 2013-12-28 MED ORDER — POTASSIUM CHLORIDE 20 MEQ/15ML (10%) PO LIQD
40.0000 meq | Freq: Two times a day (BID) | ORAL | Status: AC
  Administered 2013-12-28 – 2013-12-29 (×2): 40 meq via ORAL
  Filled 2013-12-28 (×2): qty 30

## 2013-12-28 MED ORDER — POTASSIUM CHLORIDE CRYS ER 20 MEQ PO TBCR
40.0000 meq | EXTENDED_RELEASE_TABLET | Freq: Two times a day (BID) | ORAL | Status: DC
  Administered 2013-12-28: 40 meq via ORAL
  Filled 2013-12-28: qty 2

## 2013-12-28 NOTE — Progress Notes (Signed)
     ADVANCED HF PROGRESS NOTE  HPI:  Ms. Fromme is a 80 y.o. female with history of diastolic heart failure, severe mitral regurgitation no surgical candidate, HL and hypothyroidism.   She was admitted earlier in the week  from HF clinic with new onset A fib. She converted to NSR before amiodarone started. She was given amiodarone bolus and started on amiodarone 400 mg bid as well as eliquis. She was discharged yesterday but returned to ER several hours later with worsening dyspnea. Sats dropped into mid 80s at home. CXR with mild vascular congestion. No infiltrate. pBNP > 12,500. No fevers or chills  Diuresing well. Breathing better. Very weak.    PHYSICAL EXAM: Filed Vitals:   12/28/13 0525  BP: 103/52  Pulse: 68  Temp: 97.9 F (36.6 C)  Resp: 16     Intake/Output Summary (Last 24 hours) at 12/28/13 0907 Last data filed at 12/28/13 0527  Gross per 24 hour  Intake    720 ml  Output   1700 ml  Net   -980 ml    General: Elderly. Weak. Lying in bed HEENT: normal  Neck: supple. JVP 7. Carotids 2+ bilaterally; no bruits. No lymphadenopathy or thryomegaly appreciated.  Cor: s/p L mastectomy. PMI normal. regular rate & irregular rhythm. 3/6 MR murmur. No rubs, gallops.  Lungs: clear Abdomen: soft, nontender, nondistended. No hepatosplenomegaly. No bruits or masses. Good bowel sounds.  Extremities: no cyanosis, clubbing, rash, tr edema  Neuro: alert & orientedx3, cranial nerves grossly intact. Moves all 4 extremities w/o difficulty. Affect pleasant.    ECG: SR 67 RBBB  No results found for this or any previous visit (from the past 24 hour(s)). Dg Chest Portable 1 View  12/26/2013   CLINICAL DATA:  Shortness of breath for several days.  EXAM: PORTABLE CHEST - 1 VIEW  COMPARISON:  Chest radiograph performed 12/25/2013  FINDINGS: The lungs are hypoexpanded. Vascular congestion is noted. Chronically increased interstitial markings are seen, grossly unchanged from the prior  study. No pleural effusion or pneumothorax is identified.  The cardiomediastinal silhouette is enlarged. No acute osseous abnormalities are seen. Clips are seen overlying the left axilla.  IMPRESSION: Lungs hypoexpanded. Vascular congestion and cardiomegaly noted. Chronically increased interstitial markings again seen.   Electronically Signed   By: Garald Balding M.D.   On: 12/26/2013 22:01     ASSESSMENT: 1. A/c diastolic HF 2. PAF, recently diagnosed - loading amiodarone 3. Severe MR 4. Hypothyroidism 5. RBBB 6. CKD, IV 7. Hyperkalemia, improved 8. Chronic anemia 9. Limited Code---> No CPR. Intubation ok 10. A/c respiratory failure   PLAN/DISCUSSION:  Improved with diuresis. Will continue for now. BMET pending. Need PT!  Maintaining SR. Continue amio and Eliquis.   Possibly home tomorrow.   Daniel Bensimhon,MD 9:07 AM

## 2013-12-28 NOTE — Progress Notes (Signed)
[facility] PROGRESS NOTE  [name] UXN:[id number] DOB: 1918/05/04 DOA: 12/26/2013 PCP: Adella Hare, MD  Brief narrative: 80 year old female with past medical history of atrial fibrillation on apixaban and amiodarone who presented to [facility] ED 12/26/2013 with worsening shortness of breath. She was found to have BNP of 12.752. Pt was started on IV lasix in ED.   Assessment/Plan:   Principal Problem:  Acute diastolic CHF (congestive heart failure)  - 2 D ECHO 12/25/2013 showed EF of 55%; BNP on this admission 12,752 which could be also reflective of elevated creatinine  - pt on IV lasix 80 mg Q 12 BID; continue to monitor intake and output; daily weight and replete electrolytes as needed  - weight on admission 50.3 kg and this am 49.3 kg - cardiac enzymes were WNL on admission  - continue coreg 6.25 mg PO BID  - appreciate cardiology following  Active Problems:  HYPERLIPIDEMIA  - continue omega 3 supplements  Atrial fibrillation  - continue amiodarone and apixaban  - continue coreg 6.25 mg BID  Hyperkalemia and hypokalemia - initially potassium 5.6 then WNL - likely due to lasix came down so we are repleting with PO potassium Chronic kidney disease (CKD), stage IV (severe)  - creatinine 1.47 on admission  - Cr trending down to 1.44 --> 1.31  Code Status: full code  Family Communication: no family at the bedside  Disposition Plan: home when stable   Consultants:  Cardiology  Procedures:  None  Antibiotics:  None    Leisa Lenz, MD  [facility] Pager [phone]  If 7PM-7AM, please contact night-coverage [email or web address] Password [facility] 12/28/2013, 8:12 AM   LOS: 2 days    HPI/Subjective: No acute overnight events.   Objective: Filed Vitals:   12/27/13 1300 12/27/13 1331 12/27/13 2057 12/28/13 0525  BP: 119/79 103/54 102/53 103/52  Pulse:  65 58 68  Temp:  98 F (36.7 C) 98.2 F (36.8 C) 97.9 F (36.6 C)  TempSrc:  Oral Oral Oral  Resp:  16 16  16   Height:      Weight:    49.261 kg (108 lb 9.6 oz)  SpO2:  96% 98% 99%    Intake/Output Summary (Last 24 hours) at 12/28/13 2376 Last data filed at 12/28/13 2831  Gross per 24 hour  Intake    720 ml  Output   1700 ml  Net   -980 ml    Exam:   General:  Pt is alert, follows commands appropriately, not in acute distress  Cardiovascular: Regular rate and rhythm, S1/S2, no murmurs, no rubs, no gallops  Respiratory: Clear to auscultation bilaterally, no wheezing, no crackles, no rhonchi  Abdomen: Soft, non tender, non distended, bowel sounds present, no guarding  Extremities: No edema, pulses DP and PT palpable bilaterally  Neuro: Grossly nonfocal  Data Reviewed: Basic Metabolic Panel:  Recent Labs Lab 12/24/13 2000 12/25/13 0225 12/26/13 0232 12/26/13 2133 12/27/13 0400  NA 140 138 140 139 140  K 2.7* 4.8 3.9 5.6* 3.9  CL 90* 93* 95* 95* 95*  CO2 35* 32 35* 31 34*  GLUCOSE 140* 190* 99 174* 124*  BUN 27* 33* 33* 35* 33*  CREATININE 1.19* 1.46* 1.47* 1.55* 1.44*  CALCIUM 9.7 9.6 9.1 9.4 9.3  MG 2.2  --   --   --   --    Liver Function Tests:  Recent Labs Lab 12/24/13 2000  AST 30  ALT 18  ALKPHOS 80  BILITOT 0.5  PROT  7.8  ALBUMIN 3.3*   No results found for this basename: LIPASE, AMYLASE,  in the last 168 hours No results found for this basename: AMMONIA,  in the last 168 hours CBC:  Recent Labs Lab 12/24/13 2000 12/26/13 2133 12/27/13 0400  WBC 7.4 9.2 7.4  NEUTROABS 4.3 5.3 4.6  HGB 10.7* 11.9* 10.9*  HCT 33.2* 36.3 33.4*  MCV 94.3 94.5 94.4  PLT 158 166 145*   Cardiac Enzymes:  Recent Labs Lab 12/27/13 0400  TROPONINI <0.30   BNP: No components found with this basename: POCBNP,  CBG: No results found for this basename: GLUCAP,  in the last 168 hours  MRSA PCR SCREENING     Status: None   Collection Time    12/25/13  4:29 AM      Result Value Ref Range Status   MRSA by PCR NEGATIVE  NEGATIVE Final     Studies: Dg Chest  Portable 1 View 12/26/2013    IMPRESSION: Lungs hypoexpanded. Vascular congestion and cardiomegaly noted. Chronically increased interstitial markings again seen.   Electronically Signed   By: Garald Balding M.D.   On: 12/26/2013 22:01    Scheduled Meds: . amiodarone  200 mg Oral BID  . apixaban  2.5 mg Oral BID  . calcium-vitamin D  1 tablet Oral TID WC  . carvedilol  6.25 mg Oral BID WC  . cycloSPORINE  1 drop Both Eyes BID  . furosemide  80 mg Intravenous BID  . guaiFENesin  1,200 mg Oral BID  . omega-3 acid ethyl esters  2 g Oral QPM

## 2013-12-28 NOTE — Discharge Instructions (Signed)
Information on my medicine - ELIQUIS (apixaban)  This medication education was reviewed with me or my healthcare representative as part of my discharge preparation.  The pharmacist that spoke with me during my hospital stay was:  Uvaldo Rising, BCPS 12/28/2013 12:58 PM   Why was Eliquis prescribed for you? Eliquis was prescribed for you to reduce the risk of a blood clot forming that can cause a stroke if you have a medical condition called atrial fibrillation (a type of irregular heartbeat).  What do You need to know about Eliquis ? Take your Eliquis TWICE DAILY - one tablet in the morning and one tablet in the evening with or without food. If you have difficulty swallowing the tablet whole please discuss with your pharmacist how to take the medication safely.  Take Eliquis exactly as prescribed by your doctor and DO NOT stop taking Eliquis without talking to the doctor who prescribed the medication.  Stopping may increase your risk of developing a stroke.  Refill your prescription before you run out.  After discharge, you should have regular check-up appointments with your healthcare provider that is prescribing your Eliquis.  In the future your dose may need to be changed if your kidney function or weight changes by a significant amount or as you get older.  What do you do if you miss a dose? If you miss a dose, take it as soon as you remember on the same day and resume taking twice daily.  Do not take more than one dose of ELIQUIS at the same time to make up a missed dose.  Important Safety Information A possible side effect of Eliquis is bleeding. You should call your healthcare provider right away if you experience any of the following:   Bleeding from an injury or your nose that does not stop.   Unusual colored urine (red or dark brown) or unusual colored stools (red or black).   Unusual bruising for unknown reasons.   A serious fall or if you hit your head (even if  there is no bleeding).  Some medicines may interact with Eliquis and might increase your risk of bleeding or clotting while on Eliquis. To help avoid this, consult your healthcare provider or pharmacist prior to using any new prescription or non-prescription medications, including herbals, vitamins, non-steroidal anti-inflammatory drugs (NSAIDs) and supplements.  This website has more information on Eliquis (apixaban): [email or web address].no.

## 2013-12-28 NOTE — Progress Notes (Addendum)
Patient had critical lab value: Potassium of 2.8.  Dr. Haroldine Laws paged two times no call was returned.  I then notified Dr. Charlies Silvers who was rounding on the patient, Dr. Charlies Silvers to enter new orders

## 2013-12-28 NOTE — Evaluation (Signed)
Physical Therapy Evaluation Patient Details Name: [name] MRN: [id number] DOB: April 08, 1918 Today's Date: 12/28/2013 Time: 6063-0160 PT Time Calculation (min): 22 min  PT Assessment / Plan / Recommendation History of Present Illness  80 y.o. female with history of diastolic heart failure, severe mitral regurgitation no surgical candidate, HL and hypothyroidism. She was admitted earlier in the week  from HF clinic with new onset A fib. She converted to NSR before amiodarone started. She was given amiodarone bolus and started on amiodarone 400 mg bid as well as eliquis. She was discharged yesterday but returned to ER several hours later with worsening dyspnea.  Clinical Impression  Pt doing well with mobility and should be able to return home with family.  Will continue PT in acute setting to maximize independence and safety to improve quality of life and decr burden of care. Encouraged family to amb with pt in room and halls as she tolerates.    PT Assessment  Patient needs continued PT services    Follow Up Recommendations  No PT follow up;Supervision - Intermittent    Does the patient have the potential to tolerate intense rehabilitation      Barriers to Discharge        Equipment Recommendations  None recommended by PT    Recommendations for Other Services     Frequency Min 3X/week    Precautions / Restrictions Precautions Precautions: Fall   Pertinent Vitals/Pain No c/o's      Mobility  Bed Mobility Overal bed mobility: Modified Independent General bed mobility comments: Incr time and use of rail Transfers Overall transfer level: Needs assistance Equipment used: Rolling walker (2 wheeled) Transfers: Sit to/from Stand Sit to Stand: Supervision General transfer comment: supervision for safety Ambulation/Gait Ambulation/Gait assistance: Supervision Ambulation Distance (Feet): 130 Feet Assistive device: Rolling walker (2 wheeled) Gait Pattern/deviations:  Step-through pattern;Decreased stride length;Trunk flexed General Gait Details: Demonstrates good use of walker.     Exercises     PT Diagnosis: Generalized weakness;Difficulty walking  PT Problem List: Decreased strength;Decreased activity tolerance;Decreased balance;Decreased mobility PT Treatment Interventions: DME instruction;Gait training;Functional mobility training;Therapeutic activities;Therapeutic exercise;Balance training;Patient/family education     PT Goals(Current goals can be found in the care plan section) Acute Rehab PT Goals Patient Stated Goal: return home PT Goal Formulation: With patient Time For Goal Achievement: 12/28/13 Potential to Achieve Goals: Good  Visit Information  Last PT Received On: 12/28/13 Assistance Needed: +1 History of Present Illness: 80 y.o. female with history of diastolic heart failure, severe mitral regurgitation no surgical candidate, HL and hypothyroidism. She was admitted earlier in the week  from HF clinic with new onset A fib. She converted to NSR before amiodarone started. She was given amiodarone bolus and started on amiodarone 400 mg bid as well as eliquis. She was discharged yesterday but returned to ER several hours later with worsening dyspnea.       Prior [place] expects to be discharged to:: Private residence Living Arrangements: Children Available Help at Discharge: Family;Available 24 hours/day Type of Home: House Home Access: Stairs to enter [employer] of Steps: 3 Entrance Stairs-Rails: Right;Left Home Layout: Two level;Able to live on main level with bedroom/bathroom Home Equipment: Gilford Rile - 2 wheels;Cane - single point;Bedside commode Prior Function Level of Independence: Independent with assistive device(s) Comments: Household amb with rolling walker. Communication Communication: HOH    Cognition  Cognition Arousal/Alertness: Awake/alert Behavior During Therapy: WFL  for tasks assessed/performed Overall Cognitive Status: Within Functional Limits for tasks  assessed    Extremity/Trunk Assessment Upper Extremity Assessment Upper Extremity Assessment: Generalized weakness Lower Extremity Assessment Lower Extremity Assessment: Generalized weakness   Balance    End of Session PT - End of Session Equipment Utilized During Treatment: Gait belt; oxygen Activity Tolerance: Patient limited by fatigue Patient left: in chair;with call bell/phone within reach;with family/visitor present Nurse Communication: Mobility status  GP     [facility] 12/28/2013, 10:59 AM  [facility] Christyanna Mckeon PT [phone]

## 2013-12-29 DIAGNOSIS — N184 Chronic kidney disease, stage 4 (severe): Secondary | ICD-10-CM

## 2013-12-29 LAB — BASIC METABOLIC PANEL
BUN: 30 mg/dL — ABNORMAL HIGH (ref 6–23)
CHLORIDE: 93 meq/L — AB (ref 96–112)
CO2: 33 meq/L — AB (ref 19–32)
CREATININE: 1.41 mg/dL — AB (ref 0.50–1.10)
Calcium: 9.9 mg/dL (ref 8.4–10.5)
GFR calc Af Amer: 36 mL/min — ABNORMAL LOW (ref >90)
GFR calc non Af Amer: 31 mL/min — ABNORMAL LOW (ref >90)
Glucose, Bld: 129 mg/dL — ABNORMAL HIGH (ref 70–99)
Potassium: 5.7 mEq/L — ABNORMAL HIGH (ref 3.7–5.3)
Sodium: 139 mEq/L (ref 137–147)

## 2013-12-29 LAB — POTASSIUM: POTASSIUM: 4.8 meq/L (ref 3.7–5.3)

## 2013-12-29 MED ORDER — POTASSIUM CHLORIDE 20 MEQ/15ML (10%) PO LIQD
40.0000 meq | Freq: Two times a day (BID) | ORAL | Status: DC
  Filled 2013-12-29 (×2): qty 30

## 2013-12-29 NOTE — Progress Notes (Addendum)
[facility] PROGRESS NOTE  [name] PPJ:[id number] DOB: 01/10/1918 DOA: 12/26/2013 PCP: Adella Hare, MD  Brief narrative: 80 year old female with past medical history of atrial fibrillation on apixaban and amiodarone who presented to [facility] ED 12/26/2013 with worsening shortness of breath. She was found to have BNP of 12.752. Pt was started on IV lasix in ED.   Assessment/Plan:   Principal Problem:  Acute diastolic CHF (congestive heart failure)  -Severe MR - 2 D ECHO 12/25/2013 showed EF of 55% - pt was on IV lasix 80 mg Q 12 BID; held due to soft BP  - weight down 1lb - still with basilar crackles but soft BP, defer further diuretics to Cards - continue coreg 6.25 mg PO BID  - cardiology following  - check Bmet this am  HYPERLIPIDEMIA  - continue omega 3 supplements   Atrial fibrillation  - continue amiodarone and apixaban  - continue coreg 6.25 mg BID    hypokalemia - repleted yesterday, check bmet this am  Chronic kidney disease (CKD), stage IV (severe)  - stable  Code Status: full code  Family Communication: d/w daughter and son at bedside  Disposition Plan: home when stable   Consultants:  Cardiology  Procedures:  None  Antibiotics:  None    Domenic Polite, MD  [facility] Pager [phone]  If 7PM-7AM, please contact night-coverage [email or web address] Password [facility] 12/29/2013, 9:38 AM   LOS: 3 days    HPI/Subjective: No acute overnight events, breathing better, weak, sleepy this am  Objective: Filed Vitals:   12/28/13 0525 12/28/13 1912 12/28/13 2038 12/29/13 0527  BP: 103/52 106/48 97/44 97/43   Pulse: 68 61 59 60  Temp: 97.9 F (36.6 C) 98.3 F (36.8 C) 97.9 F (36.6 C) 97.8 F (36.6 C)  TempSrc: Oral Oral Oral Oral  Resp: 16 16 16 18   Height:      Weight: 49.261 kg (108 lb 9.6 oz)   49.85 kg (109 lb 14.4 oz)  SpO2: 99% 100% 100% 99%    Intake/Output Summary (Last 24 hours) at 12/29/13 3825 Last data filed at 12/29/13  0526  Gross per 24 hour  Intake    478 ml  Output   1400 ml  Net   -922 ml    Exam:   General:  Pt is alert, follows commands appropriately, not in acute distress  Cardiovascular: Regular rate and rhythm, K5/L9, pansystolic murmur, no rubs, no gallops  Respiratory: bilateral basilar crackles  Abdomen: Soft, non tender, non distended, bowel sounds present, no guarding  Extremities: No edema, pulses DP and PT palpable bilaterally  Neuro: Grossly nonfocal  Data Reviewed: Basic Metabolic Panel:  Recent Labs Lab 12/24/13 2000 12/25/13 0225 12/26/13 0232 12/26/13 2133 12/27/13 0400 12/28/13 0932  NA 140 138 140 139 140 142  K 2.7* 4.8 3.9 5.6* 3.9 2.8*  CL 90* 93* 95* 95* 95* 93*  CO2 35* 32 35* 31 34* 35*  GLUCOSE 140* 190* 99 174* 124* 84  BUN 27* 33* 33* 35* 33* 29*  CREATININE 1.19* 1.46* 1.47* 1.55* 1.44* 1.31*  CALCIUM 9.7 9.6 9.1 9.4 9.3 9.7  MG 2.2  --   --   --   --   --    Liver Function Tests:  Recent Labs Lab 12/24/13 2000  AST 30  ALT 18  ALKPHOS 80  BILITOT 0.5  PROT 7.8  ALBUMIN 3.3*   No results found for this basename: LIPASE, AMYLASE,  in the last 168 hours No results found  for this basename: AMMONIA,  in the last 168 hours CBC:  Recent Labs Lab 12/24/13 2000 12/26/13 2133 12/27/13 0400 12/28/13 0932  WBC 7.4 9.2 7.4 6.5  NEUTROABS 4.3 5.3 4.6  --   HGB 10.7* 11.9* 10.9* 11.0*  HCT 33.2* 36.3 33.4* 34.2*  MCV 94.3 94.5 94.4 94.5  PLT 158 166 145* 149*   Cardiac Enzymes:  Recent Labs Lab 12/27/13 0400  TROPONINI <0.30   BNP: No components found with this basename: POCBNP,  CBG: No results found for this basename: GLUCAP,  in the last 168 hours  MRSA PCR SCREENING     Status: None   Collection Time    12/25/13  4:29 AM      Result Value Ref Range Status   MRSA by PCR NEGATIVE  NEGATIVE Final     Studies: Dg Chest Portable 1 View 12/26/2013    IMPRESSION: Lungs hypoexpanded. Vascular congestion and cardiomegaly  noted. Chronically increased interstitial markings again seen.   Electronically Signed   By: Garald Balding M.D.   On: 12/26/2013 22:01    Scheduled Meds: . amiodarone  200 mg Oral BID  . apixaban  2.5 mg Oral BID  . calcium-vitamin D  1 tablet Oral TID WC  . carvedilol  6.25 mg Oral BID WC  . cycloSPORINE  1 drop Both Eyes BID  . furosemide  80 mg Intravenous BID  . guaiFENesin  1,200 mg Oral BID  . omega-3 acid ethyl esters  2 g Oral QPM

## 2013-12-29 NOTE — Progress Notes (Signed)
Primary cardiologist: Dr. Pierre Bali  Subjective:    Feels weak this morning. States she slept well. No palpitations.  Objective:   Temp:  [97.8 F (36.6 C)-98.3 F (36.8 C)] 97.8 F (36.6 C) (02/28 0527) Pulse Rate:  [59-61] 60 (02/28 0527) Resp:  [16-18] 18 (02/28 0527) BP: (97-106)/(43-48) 97/43 mmHg (02/28 0527) SpO2:  [99 %-100 %] 99 % (02/28 0527) Weight:  [109 lb 14.4 oz (49.85 kg)] 109 lb 14.4 oz (49.85 kg) (02/28 0527) Last BM Date: 12/27/13  Filed Weights   12/27/13 0028 12/28/13 0525 12/29/13 0527  Weight: 110 lb 12.8 oz (50.259 kg) 108 lb 9.6 oz (49.261 kg) 109 lb 14.4 oz (49.85 kg)    Intake/Output Summary (Last 24 hours) at 12/29/13 1122 Last data filed at 12/29/13 0845  Gross per 24 hour  Intake    358 ml  Output   1125 ml  Net   -767 ml    Telemetry: Sinus rhythm.  Exam:  General: Frail-appearing elderly woman, no distress.  Lungs: Clear, nonlabored.  Cardiac: RRR, 7-8/2 systolic murmur best at the apex.  Extremities: No pitting edema.   Lab Results:  Basic Metabolic Panel:  Recent Labs Lab 12/24/13 2000  12/26/13 2133 12/27/13 0400 12/28/13 0932  NA 140  < > 139 140 142  K 2.7*  < > 5.6* 3.9 2.8*  CL 90*  < > 95* 95* 93*  CO2 35*  < > 31 34* 35*  GLUCOSE 140*  < > 174* 124* 84  BUN 27*  < > 35* 33* 29*  CREATININE 1.19*  < > 1.55* 1.44* 1.31*  CALCIUM 9.7  < > 9.4 9.3 9.7  MG 2.2  --   --   --   --   < > = values in this interval not displayed.  Liver Function Tests:  Recent Labs Lab 12/24/13 2000  AST 30  ALT 18  ALKPHOS 80  BILITOT 0.5  PROT 7.8  ALBUMIN 3.3*    CBC:  Recent Labs Lab 12/26/13 2133 12/27/13 0400 12/28/13 0932  WBC 9.2 7.4 6.5  HGB 11.9* 10.9* 11.0*  HCT 36.3 33.4* 34.2*  MCV 94.5 94.4 94.5  PLT 166 145* 149*    Cardiac Enzymes:  Recent Labs Lab 12/27/13 0400  TROPONINI <0.30    BNP:  Recent Labs  10/17/13 1538 12/24/13 2000 12/26/13 2133  PROBNP 12898.0* 11373.0*  12752.0*    Coagulation:  Recent Labs Lab 12/26/13 2133  INR 1.53*    Echocardiogram (2/24); Study Conclusions  - Left ventricle: The cavity size was normal. Wall thickness was normal. Systolic function was normal. The estimated ejection fraction was in the range of 55% to 60%. Wall motion was normal; there were no regional wall motion abnormalities. Features are consistent with a pseudonormal left ventricular filling pattern, with concomitant abnormal relaxation and increased filling pressure (grade 2 diastolic dysfunction). - Aortic valve: Mild to moderate regurgitation directed centrally in the LVOT. - Mitral valve: Calcified annulus. Moderately thickened, mildly calcified leaflets . Severe, holosystolicprolapse, involving the posterior leaflet. Moderate to severe regurgitation. - Left atrium: The atrium was severely dilated. - Right ventricle: The cavity size was moderately to severely dilated. Systolic function was moderately to severely reduced. - Right atrium: The atrium was severely dilated. - Tricuspid valve: Moderate regurgitation. - Pulmonary arteries: Systolic pressure was moderately increased. PA peak pressure: 27mm Hg (S).    Medications:   Scheduled Medications: . amiodarone  200 mg Oral Daily  . apixaban  2.5 mg Oral BID  . calcium-vitamin D  1 tablet Oral TID WC  . carvedilol  6.25 mg Oral BID WC  . cycloSPORINE  1 drop Both Eyes BID  . guaiFENesin  1,200 mg Oral BID  . omega-3 acid ethyl esters  2 g Oral QPM  . polyvinyl alcohol  1 drop Both Eyes QHS  . sodium chloride  3 mL Intravenous Q12H      PRN Medications:  acetaminophen, acetaminophen, ondansetron (ZOFRAN) IV, ondansetron   Assessment:   1. Acute on chronic diastolic heart failure. Recent echocardiogram noted above.  2. MVP with moderate to severe mitral regurgitation.  3. Recently diagnosed atrial fibrillation, currently in sinus rhythm, on amiodarone and Eliquis.  4. CKD,  stage 3, current creatinine 1.3.  5.  Hypokalemia.   Plan/Discussion:    Discussed with patient and family members in room. She does not feel ready for discharge yet. Lasix has been held with blood pressure trending down. Her weight is down 1 pound, possibly near dry weight, she has had a reasonable diuresis so far. Last outpatient Lasix dose was 60 mg twice daily with intermittent use of metolazone. Daughter states that she had been feeling well and her weight was around 110 pounds. She is not on any potassium supplements, this needs to be resumed. Continue other medical regimen, followup BMET in a.m.   Satira Sark, M.D., F.A.C.C.

## 2013-12-30 LAB — BASIC METABOLIC PANEL
BUN: 30 mg/dL — ABNORMAL HIGH (ref 6–23)
CO2: 34 meq/L — AB (ref 19–32)
CREATININE: 1.42 mg/dL — AB (ref 0.50–1.10)
Calcium: 9.8 mg/dL (ref 8.4–10.5)
Chloride: 98 mEq/L (ref 96–112)
GFR calc Af Amer: 36 mL/min — ABNORMAL LOW (ref >90)
GFR calc non Af Amer: 31 mL/min — ABNORMAL LOW (ref >90)
Glucose, Bld: 91 mg/dL (ref 70–99)
Potassium: 4.5 mEq/L (ref 3.7–5.3)
SODIUM: 144 meq/L (ref 137–147)

## 2013-12-30 LAB — CBC
HEMATOCRIT: 31.5 % — AB (ref 36.0–46.0)
Hemoglobin: 10 g/dL — ABNORMAL LOW (ref 12.0–15.0)
MCH: 30.2 pg (ref 26.0–34.0)
MCHC: 31.7 g/dL (ref 30.0–36.0)
MCV: 95.2 fL (ref 78.0–100.0)
Platelets: 148 10*3/uL — ABNORMAL LOW (ref 150–400)
RBC: 3.31 MIL/uL — AB (ref 3.87–5.11)
RDW: 14.4 % (ref 11.5–15.5)
WBC: 7.8 10*3/uL (ref 4.0–10.5)

## 2013-12-30 MED ORDER — FUROSEMIDE 40 MG PO TABS
40.0000 mg | ORAL_TABLET | Freq: Two times a day (BID) | ORAL | Status: DC
  Administered 2013-12-30 (×2): 40 mg via ORAL
  Filled 2013-12-30 (×5): qty 1

## 2013-12-30 MED ORDER — POTASSIUM CHLORIDE 20 MEQ/15ML (10%) PO LIQD
40.0000 meq | Freq: Every day | ORAL | Status: DC
Start: 2013-12-30 — End: 2013-12-30
  Filled 2013-12-30: qty 30

## 2013-12-30 MED ORDER — NYSTATIN 100000 UNIT/ML MT SUSP
5.0000 mL | Freq: Four times a day (QID) | OROMUCOSAL | Status: DC
  Administered 2013-12-30 – 2013-12-31 (×5): 500000 [IU] via OROMUCOSAL
  Filled 2013-12-30 (×8): qty 5

## 2013-12-30 MED ORDER — POTASSIUM CHLORIDE 20 MEQ/15ML (10%) PO LIQD
20.0000 meq | Freq: Every day | ORAL | Status: DC
  Administered 2013-12-30 – 2013-12-31 (×2): 20 meq via ORAL
  Filled 2013-12-30 (×2): qty 15

## 2013-12-30 NOTE — Progress Notes (Signed)
Pt may be having a beginning stage of an oral thrush. Kindly address. thanks

## 2013-12-30 NOTE — Progress Notes (Signed)
Pt had ambulated 100 feet with O2

## 2013-12-30 NOTE — Progress Notes (Signed)
Primary cardiologist: Dr. Pierre Bali  Subjective:    Patient up in chair this morning. States that she feels better. Eating breakfast.  Objective:   Temp:  [98.2 F (36.8 C)] 98.2 F (36.8 C) (03/01 0506) Pulse Rate:  [60-62] 60 (03/01 0506) Resp:  [15] 15 (03/01 0506) BP: (103-108)/(53-57) 103/53 mmHg (03/01 0506) SpO2:  [95 %-97 %] 95 % (03/01 0506) Last BM Date: 01/24/14  Filed Weights   12/27/13 0028 12/28/13 0525 12/29/13 0527  Weight: 110 lb 12.8 oz (50.259 kg) 108 lb 9.6 oz (49.261 kg) 109 lb 14.4 oz (49.85 kg)    Intake/Output Summary (Last 24 hours) at 12/30/13 0826 Last data filed at 12/30/13 0453  Gross per 24 hour  Intake    480 ml  Output    550 ml  Net    -70 ml    Telemetry: Sinus rhythm. Brief burst of SVT.  Exam:  General: Frail-appearing elderly woman, no distress.  Lungs: Clear, nonlabored.  Cardiac: RRR, 3-5/3 systolic murmur best at the apex.  Extremities: No pitting edema.   Lab Results:  Basic Metabolic Panel:  Recent Labs Lab 12/24/13 2000  12/28/13 0932 12/29/13 1100 12/29/13 1500 12/30/13 0440  NA 140  < > 142 139  --  144  K 2.7*  < > 2.8* 5.7* 4.8 4.5  CL 90*  < > 93* 93*  --  98  CO2 35*  < > 35* 33*  --  34*  GLUCOSE 140*  < > 84 129*  --  91  BUN 27*  < > 29* 30*  --  30*  CREATININE 1.19*  < > 1.31* 1.41*  --  1.42*  CALCIUM 9.7  < > 9.7 9.9  --  9.8  MG 2.2  --   --   --   --   --   < > = values in this interval not displayed.   CBC:  Recent Labs Lab 12/27/13 0400 12/28/13 0932 12/30/13 0440  WBC 7.4 6.5 7.8  HGB 10.9* 11.0* 10.0*  HCT 33.4* 34.2* 31.5*  MCV 94.4 94.5 95.2  PLT 145* 149* 148*    Cardiac Enzymes:  Recent Labs Lab 12/27/13 0400  TROPONINI <0.30    Echocardiogram (2/24); Study Conclusions  - Left ventricle: The cavity size was normal. Wall thickness was normal. Systolic function was normal. The estimated ejection fraction was in the range of 55% to 60%. Wall motion  was normal; there were no regional wall motion abnormalities. Features are consistent with a pseudonormal left ventricular filling pattern, with concomitant abnormal relaxation and increased filling pressure (grade 2 diastolic dysfunction). - Aortic valve: Mild to moderate regurgitation directed centrally in the LVOT. - Mitral valve: Calcified annulus. Moderately thickened, mildly calcified leaflets . Severe, holosystolicprolapse, involving the posterior leaflet. Moderate to severe regurgitation. - Left atrium: The atrium was severely dilated. - Right ventricle: The cavity size was moderately to severely dilated. Systolic function was moderately to severely reduced. - Right atrium: The atrium was severely dilated. - Tricuspid valve: Moderate regurgitation. - Pulmonary arteries: Systolic pressure was moderately increased. PA peak pressure: 13mm Hg (S).    Medications:   Scheduled Medications: . amiodarone  200 mg Oral Daily  . apixaban  2.5 mg Oral BID  . calcium-vitamin D  1 tablet Oral TID WC  . carvedilol  6.25 mg Oral BID WC  . cycloSPORINE  1 drop Both Eyes BID  . guaiFENesin  1,200 mg Oral BID  .  omega-3 acid ethyl esters  2 g Oral QPM  . polyvinyl alcohol  1 drop Both Eyes QHS  . sodium chloride  3 mL Intravenous Q12H     PRN Medications: acetaminophen, acetaminophen, ondansetron (ZOFRAN) IV, ondansetron   Assessment:   1. Acute on chronic diastolic heart failure. Recent echocardiogram noted above.  2. MVP with moderate to severe mitral regurgitation.  3. Recently diagnosed atrial fibrillation, currently in sinus rhythm, on amiodarone and Eliquis.  4. CKD, stage 3, current creatinine 1.3.  5. Hypokalemia, resolved.   Plan/Discussion:    Patient feels somewhat better today. I wonder whether she is around her dry weight. We held Lasix yesterday. Plan to resume Lasix at 40 mg twice daily with potassium supplements, ambulate today, followup lab work in the  morning. Possible discharge tomorrow if continues to do better.   Satira Sark, M.D., F.A.C.C.

## 2013-12-30 NOTE — Progress Notes (Signed)
Ms. Dismore family states that she frequently chokes on thin liquids and pills.  They use thickened liquids such as resource and ensure at home to prevent this.  She does have throat clearing and some coughing with meds and water.

## 2013-12-30 NOTE — Progress Notes (Signed)
[facility] PROGRESS NOTE  [name] LOV:[id number] DOB: Apr 01, 1918 DOA: 12/26/2013 PCP: Adella Hare, MD  Brief narrative: 80 year old female with past medical history of atrial fibrillation on apixaban and amiodarone who presented to [facility] ED 12/26/2013 with worsening shortness of breath. She was found to have BNP of 12.752. Pt was started on IV lasix in ED.   Assessment/Plan:   Principal Problem:  Acute diastolic CHF with Severe MR - 2 D ECHO 12/25/2013 showed EF of 55% - pt was on IV lasix 80 mg Q 12 BID; held due to soft BP  - now on PO lasix - continue coreg 6.25 mg PO BID  - cardiology following  - Bmet in am  HYPERLIPIDEMIA  - continue omega 3 supplements   Atrial fibrillation  - continue amiodarone and apixaban  - continue coreg 6.25 mg BID    hypokalemia - repleted yesterday, check bmet this am  Chronic kidney disease (CKD), stage IV (severe)  - stable  Code Status: full code  Family Communication: d/w daughter and son at bedside  Disposition Plan: home tomorrow if stable  Consultants:  Cardiology  Procedures:  None  Antibiotics:  None    Domenic Polite, MD  [facility] Pager [phone]  If 7PM-7AM, please contact night-coverage [email or web address] Password [email or web address] 12/30/2013, 1:13 PM   LOS: 4 days    HPI/Subjective: No acute overnight events, breathing better, weak, sleepy this am  Objective: Filed Vitals:   12/28/13 2038 12/29/13 0527 12/29/13 2100 12/30/13 0506  BP: 97/44 97/43 108/57 103/53  Pulse: 59 60 62 60  Temp: 97.9 F (36.6 C) 97.8 F (36.6 C) 98.2 F (36.8 C) 98.2 F (36.8 C)  TempSrc: Oral Oral Oral Oral  Resp: 16 18 15 15   Height:      Weight:  49.85 kg (109 lb 14.4 oz)    SpO2: 100% 99% 97% 95%    Intake/Output Summary (Last 24 hours) at 12/30/13 1313 Last data filed at 12/30/13 0900  Gross per 24 hour  Intake    480 ml  Output    550 ml  Net    -70 ml    Exam:   General:  Pt is alert, follows commands  appropriately, not in acute distress  Cardiovascular: Regular rate and rhythm, Y3/K1, pansystolic murmur, no rubs, no gallops  Respiratory: bilateral basilar crackles  Abdomen: Soft, non tender, non distended, bowel sounds present, no guarding  Extremities: No edema, pulses DP and PT palpable bilaterally  Neuro: Grossly nonfocal  Data Reviewed: Basic Metabolic Panel:  Recent Labs Lab 12/24/13 2000  12/26/13 2133 12/27/13 0400 12/28/13 0932 12/29/13 1100 12/29/13 1500 12/30/13 0440  NA 140  < > 139 140 142 139  --  144  K 2.7*  < > 5.6* 3.9 2.8* 5.7* 4.8 4.5  CL 90*  < > 95* 95* 93* 93*  --  98  CO2 35*  < > 31 34* 35* 33*  --  34*  GLUCOSE 140*  < > 174* 124* 84 129*  --  91  BUN 27*  < > 35* 33* 29* 30*  --  30*  CREATININE 1.19*  < > 1.55* 1.44* 1.31* 1.41*  --  1.42*  CALCIUM 9.7  < > 9.4 9.3 9.7 9.9  --  9.8  MG 2.2  --   --   --   --   --   --   --   < > = values in this interval not displayed. Liver Function  Tests:  Recent Labs Lab 12/24/13 2000  AST 30  ALT 18  ALKPHOS 80  BILITOT 0.5  PROT 7.8  ALBUMIN 3.3*   No results found for this basename: LIPASE, AMYLASE,  in the last 168 hours No results found for this basename: AMMONIA,  in the last 168 hours CBC:  Recent Labs Lab 12/24/13 2000 12/26/13 2133 12/27/13 0400 12/28/13 0932 12/30/13 0440  WBC 7.4 9.2 7.4 6.5 7.8  NEUTROABS 4.3 5.3 4.6  --   --   HGB 10.7* 11.9* 10.9* 11.0* 10.0*  HCT 33.2* 36.3 33.4* 34.2* 31.5*  MCV 94.3 94.5 94.4 94.5 95.2  PLT 158 166 145* 149* 148*   Cardiac Enzymes:  Recent Labs Lab 12/27/13 0400  TROPONINI <0.30   BNP: No components found with this basename: POCBNP,  CBG: No results found for this basename: GLUCAP,  in the last 168 hours  MRSA PCR SCREENING     Status: None   Collection Time    12/25/13  4:29 AM      Result Value Ref Range Status   MRSA by PCR NEGATIVE  NEGATIVE Final     Studies: Dg Chest Portable 1 View 12/26/2013    IMPRESSION:  Lungs hypoexpanded. Vascular congestion and cardiomegaly noted. Chronically increased interstitial markings again seen.   Electronically Signed   By: Garald Balding M.D.   On: 12/26/2013 22:01    Scheduled Meds: . amiodarone  200 mg Oral BID  . apixaban  2.5 mg Oral BID  . calcium-vitamin D  1 tablet Oral TID WC  . carvedilol  6.25 mg Oral BID WC  . cycloSPORINE  1 drop Both Eyes BID  . furosemide  80 mg Intravenous BID  . guaiFENesin  1,200 mg Oral BID  . omega-3 acid ethyl esters  2 g Oral QPM

## 2013-12-31 LAB — BASIC METABOLIC PANEL
BUN: 26 mg/dL — AB (ref 6–23)
CHLORIDE: 95 meq/L — AB (ref 96–112)
CO2: 32 meq/L (ref 19–32)
CREATININE: 1.28 mg/dL — AB (ref 0.50–1.10)
Calcium: 9.6 mg/dL (ref 8.4–10.5)
GFR calc Af Amer: 40 mL/min — ABNORMAL LOW (ref >90)
GFR calc non Af Amer: 35 mL/min — ABNORMAL LOW (ref >90)
Glucose, Bld: 82 mg/dL (ref 70–99)
Potassium: 3.9 mEq/L (ref 3.7–5.3)
Sodium: 138 mEq/L (ref 137–147)

## 2013-12-31 LAB — CBC
HCT: 31.4 % — ABNORMAL LOW (ref 36.0–46.0)
Hemoglobin: 10.1 g/dL — ABNORMAL LOW (ref 12.0–15.0)
MCH: 30.6 pg (ref 26.0–34.0)
MCHC: 32.2 g/dL (ref 30.0–36.0)
MCV: 95.2 fL (ref 78.0–100.0)
PLATELETS: 152 10*3/uL (ref 150–400)
RBC: 3.3 MIL/uL — ABNORMAL LOW (ref 3.87–5.11)
RDW: 14.5 % (ref 11.5–15.5)
WBC: 6.7 10*3/uL (ref 4.0–10.5)

## 2013-12-31 MED ORDER — CARVEDILOL 3.125 MG PO TABS: 3.1250 mg | ORAL_TABLET | Freq: Two times a day (BID) | ORAL | Status: DC

## 2013-12-31 MED ORDER — FUROSEMIDE 40 MG PO TABS
60.0000 mg | ORAL_TABLET | Freq: Two times a day (BID) | ORAL | Status: DC
  Administered 2013-12-31: 60 mg via ORAL
  Filled 2013-12-31 (×3): qty 1

## 2013-12-31 MED ORDER — CARVEDILOL 3.125 MG PO TABS
3.1250 mg | ORAL_TABLET | Freq: Two times a day (BID) | ORAL | Status: DC
  Filled 2013-12-31 (×2): qty 1

## 2013-12-31 NOTE — Progress Notes (Signed)
Patient ID: [name], female   DOB: [birth year], 80 y.o.   MRN: [id number]  ADVANCED HF PROGRESS NOTE  HPI:  [name] is a 80 y.o. female with history of diastolic heart failure, severe mitral regurgitation no surgical candidate, HL and hypothyroidism.   She was admitted last week  from HF clinic with new onset A fib. She converted to NSR before amiodarone started. She was given amiodarone bolus and started on amiodarone 400 mg bid as well as eliquis. She was discharged but returned to ER several hours later with worsening dyspnea. Sats dropped into mid 80s at home. CXR with mild vascular congestion. No infiltrate. pBNP > 12,500. No fevers or chills  She was initially diuresed on IV Lasix.  Over the weekend, she has been on po Lasix.  Daughter says she had a good day yesterday.  She denies dyspnea currently.  Seen by PT, report does not recommend PT followup.  She remains in NSR.  SBP stable in 90s-100s.     PHYSICAL EXAM: Filed Vitals:   12/31/13 0816  BP: 96/42  Pulse: 66  Temp:   Resp:      Intake/Output Summary (Last 24 hours) at 12/31/13 0933 Last data filed at 12/31/13 0421  Gross per 24 hour  Intake    120 ml  Output    800 ml  Net   -680 ml    General: Elderly. Weak. Lying in bed HEENT: normal  Neck: supple. JVP 7. Carotids 2+ bilaterally; no bruits. No lymphadenopathy or thryomegaly appreciated.  Cor: s/p L mastectomy. PMI normal. regular rate & irregular rhythm. 3/6 MR murmur. No rubs, gallops.  Lungs: clear Abdomen: soft, nontender, nondistended. No hepatosplenomegaly. No bruits or masses. Good bowel sounds.  Extremities: no cyanosis, clubbing, rash, tr edema  Neuro: alert & orientedx3, cranial nerves grossly intact. Moves all 4 extremities w/o difficulty. Affect pleasant.    ECG: SR 67 RBBB  Results for orders placed during the hospital encounter of 12/26/13 (from the past 24 hour(s))  BASIC METABOLIC PANEL     Status: Abnormal   Collection Time   12/31/13  4:40 AM      Result Value Ref Range   Sodium 138  137 - 147 mEq/L   Potassium 3.9  3.7 - 5.3 mEq/L   Chloride 95 (*) 96 - 112 mEq/L   CO2 32  19 - 32 mEq/L   Glucose, Bld 82  70 - 99 mg/dL   BUN 26 (*) 6 - 23 mg/dL   Creatinine, Ser 1.28 (*) 0.50 - 1.10 mg/dL   Calcium 9.6  8.4 - 10.5 mg/dL   GFR calc non Af Amer 35 (*) >90 mL/min   GFR calc Af Amer 40 (*) >90 mL/min  CBC     Status: Abnormal   Collection Time    12/31/13  4:40 AM      Result Value Ref Range   WBC 6.7  4.0 - 10.5 K/uL   RBC 3.30 (*) 3.87 - 5.11 MIL/uL   Hemoglobin 10.1 (*) 12.0 - 15.0 g/dL   HCT 31.4 (*) 36.0 - 46.0 %   MCV 95.2  78.0 - 100.0 fL   MCH 30.6  26.0 - 34.0 pg   MCHC 32.2  30.0 - 36.0 g/dL   RDW 14.5  11.5 - 15.5 %   Platelets 152  150 - 400 K/uL   No results found.   ASSESSMENT: 1. A/c diastolic HF 2. PAF, recently diagnosed - loading amiodarone 3. Severe  MR 4. Hypothyroidism 5. RBBB 6. CKD IV 7. Hyperkalemia, improved 8. Chronic anemia 9. Limited Code---> No CPR. Intubation ok 10. A/c respiratory failure   PLAN/DISCUSSION: Some improvement with diuresis though weight not down much.  She has been on po Lasix for 2 days and breathing has been stable.  I am going to increase her po Lasix to 60 mg po bid.  I am also going to decrease Coreg to 3.125 mg bid with soft BP.  She will continue apixaban and amiodarone.    She feels good today.  I asked her to move around her room some this morning.  If she continues to do well, she could potentially go home this afternoon with her daughter.  She is very frail.  Will arrange followup later this week in CHF clinic.    Ariea Rochin,MD 9:33 AM

## 2013-12-31 NOTE — Discharge Summary (Signed)
Physician Discharge Summary  [name] NWG:[id number] DOB: 18-Nov-1917 DOA: 12/26/2013  PCP: Adella Hare, MD  Admit date: 12/26/2013 Discharge date: 12/31/2013  Time spent: 21minutes  Recommendations for Outpatient Follow-up:  1. CHF clinic in 1 week  Discharge Diagnoses:  Principal Problem:   Acute diastolic CHF (congestive heart failure) Active Problems:   HYPERLIPIDEMIA   MITRAL REGURGITATION, SEVERE   Atrial fibrillation   Hyperkalemia   Chronic kidney disease (CKD), stage IV (severe)   Acute on chronic diastolic congestive heart failure   Acute on chronic respiratory failure   Discharge Condition: stable  Diet recommendation: low sodium heart healthy  Filed Weights   12/28/13 0525 12/29/13 0527 12/31/13 0435  Weight: 49.261 kg (108 lb 9.6 oz) 49.85 kg (109 lb 14.4 oz) 49.941 kg (110 lb 1.6 oz)    History of present illness:  [name] is a 80 y.o. female with history of recent diagnosis fibrillation and has been placed on amiodarone and discharged yesterday was found to be increasingly short of breath after reaching home and was brought to the ER. In the ER patient has been given IV Lasix 60 mg. In addition patient's labs reveal hyperkalemia. EKG presently shows sinus rhythm. Patient has been admitted for further workup. Patient denies any chest pain nausea vomiting abdominal pain diarrhea fever chills or productive cough.  Hospital Course:  Acute diastolic CHF with Severe MR  - 2 D ECHO 12/25/2013 showed EF of 55%  - Initially diuresed with IV lasix 80 mg Q 12, then held due to soft BP  - clinically improved but weight without significant change - now on PO lasix  - continue coreg BID at lower dose due to soft BP - was followed by cardiology, discharge home on PO lasix 60mg  BID per cardiology - Close FU with Dr.Bensimhom in CHF clinic  HYPERLIPIDEMIA  - continue omega 3 supplements   Atrial fibrillation  - continue amiodarone and apixaban  - dose  of coreg changed to 3.125mg  BID from 6.25 mg BID due to soft BP  hypokalemia  - repleted   Chronic kidney disease (CKD), stage IV (severe)  - stable     Consultations:  Cardiology  Discharge Exam: Filed Vitals:   12/31/13 1040  BP: 102/49  Pulse: 60  Temp:   Resp:     General: AAOx3 Cardiovascular: S1S2/RRR Respiratory: crackles at bases  Discharge Instructions  Discharge Orders   Future Orders Complete By Expires   Diet - low sodium heart healthy  As directed    Increase activity slowly  As directed        Medication List         amiodarone 400 MG tablet  Commonly known as:  PACERONE  Take 400 mg by mouth See admin instructions. *taking 400mg  twice daily for 5 days, then 200mg  daily thereafter* Started regimen 12/24/13     calcium-vitamin D 500-200 MG-UNIT per tablet  Commonly known as:  OSCAL WITH D  Take 1 tablet by mouth 3 (three) times daily.     carvedilol 3.125 MG tablet  Commonly known as:  COREG  Take 1 tablet (3.125 mg total) by mouth 2 (two) times daily with a meal.     cycloSPORINE 0.05 % ophthalmic emulsion  Commonly known as:  RESTASIS  Place 1 drop into both eyes 2 (two) times daily.     ELIQUIS 2.5 MG Tabs tablet  Generic drug:  apixaban  Take 2.5 mg by mouth 2 (two) times daily.  FRESHKOTE OP  Place 1 drop into both eyes 4 (four) times daily.     furosemide 40 MG tablet  Commonly known as:  LASIX  Take 60 mg by mouth 2 (two) times daily.     guaiFENesin 600 MG 12 hr tablet  Commonly known as:  MUCINEX  Take 1,200 mg by mouth 2 (two) times daily.     multivitamin tablet  Take 1 tablet by mouth daily. Athruz Select 50+     omega-3 acid ethyl esters 1 G capsule  Commonly known as:  LOVAZA  Take 2 g by mouth every evening.     potassium chloride 40 MEQ/15ML (20%) Liqd  Take 40 mEq by mouth 2 (two) times daily.     PRESERVISION AREDS PO  Take 2 tablets by mouth daily.     SYSTANE 0.4-0.3 % Gel  Generic drug:  Polyethyl  Glycol-Propyl Glycol  Place 1 drop into both eyes at bedtime.       No Known Allergies     Follow-up Information   Follow up with Glori Bickers, MD. Schedule an appointment as soon as possible for a visit in 1 week.   Specialty:  Cardiology   Contact information:   [address] [place] [address] [phone]        The results of significant diagnostics from this hospitalization (including imaging, microbiology, ancillary and laboratory) are listed below for reference.    Significant Diagnostic Studies: Dg Chest Portable 1 View  12/26/2013   CLINICAL DATA:  Shortness of breath for several days.  EXAM: PORTABLE CHEST - 1 VIEW  COMPARISON:  Chest radiograph performed 12/25/2013  FINDINGS: The lungs are hypoexpanded. Vascular congestion is noted. Chronically increased interstitial markings are seen, grossly unchanged from the prior study. No pleural effusion or pneumothorax is identified.  The cardiomediastinal silhouette is enlarged. No acute osseous abnormalities are seen. Clips are seen overlying the left axilla.  IMPRESSION: Lungs hypoexpanded. Vascular congestion and cardiomegaly noted. Chronically increased interstitial markings again seen.   Electronically Signed   By: Garald Balding M.D.   On: 12/26/2013 22:01   Dg Chest Port 1 View  12/25/2013   CLINICAL DATA:  Shortness of breath.  EXAM: PORTABLE CHEST - 1 VIEW  COMPARISON:  09/22/2012 and prior chest radiographs  FINDINGS: Cardiomegaly is identified.  Interstitial prominence is again noted.  There is no evidence of airspace disease, pleural effusion or pneumothorax.  No acute bony abnormalities are identified.  IMPRESSION: Cardiomegaly without evidence of acute cardiopulmonary disease.  Chronic interstitial opacities.   Electronically Signed   By: Hassan Rowan M.D.   On: 12/25/2013 01:23    Microbiology: Recent Results (from the past 240 hour(s))  MRSA PCR SCREENING     Status: None   Collection Time     12/25/13  4:29 AM      Result Value Ref Range Status   MRSA by PCR NEGATIVE  NEGATIVE Final   Comment:            The GeneXpert MRSA Assay (FDA     approved for NASAL specimens     only), is one component of a     comprehensive MRSA colonization     surveillance program. It is not     intended to diagnose MRSA     infection nor to guide or     monitor treatment for     MRSA infections.     Labs: Basic Metabolic Panel:  Recent Labs  Lab 12/24/13 2000  12/27/13 0400 12/28/13 0932 12/29/13 1100 12/29/13 1500 12/30/13 0440 12/31/13 0440  NA 140  < > 140 142 139  --  144 138  K 2.7*  < > 3.9 2.8* 5.7* 4.8 4.5 3.9  CL 90*  < > 95* 93* 93*  --  98 95*  CO2 35*  < > 34* 35* 33*  --  34* 32  GLUCOSE 140*  < > 124* 84 129*  --  91 82  BUN 27*  < > 33* 29* 30*  --  30* 26*  CREATININE 1.19*  < > 1.44* 1.31* 1.41*  --  1.42* 1.28*  CALCIUM 9.7  < > 9.3 9.7 9.9  --  9.8 9.6  MG 2.2  --   --   --   --   --   --   --   < > = values in this interval not displayed. Liver Function Tests:  Recent Labs Lab 12/24/13 2000  AST 30  ALT 18  ALKPHOS 80  BILITOT 0.5  PROT 7.8  ALBUMIN 3.3*   No results found for this basename: LIPASE, AMYLASE,  in the last 168 hours No results found for this basename: AMMONIA,  in the last 168 hours CBC:  Recent Labs Lab 12/24/13 2000 12/26/13 2133 12/27/13 0400 12/28/13 0932 12/30/13 0440 12/31/13 0440  WBC 7.4 9.2 7.4 6.5 7.8 6.7  NEUTROABS 4.3 5.3 4.6  --   --   --   HGB 10.7* 11.9* 10.9* 11.0* 10.0* 10.1*  HCT 33.2* 36.3 33.4* 34.2* 31.5* 31.4*  MCV 94.3 94.5 94.4 94.5 95.2 95.2  PLT 158 166 145* 149* 148* 152   Cardiac Enzymes:  Recent Labs Lab 12/27/13 0400  TROPONINI <0.30   BNP: BNP (last 3 results)  Recent Labs  10/17/13 1538 12/24/13 2000 12/26/13 2133  PROBNP 12898.0* 11373.0* 12752.0*   CBG: No results found for this basename: GLUCAP,  in the last 168 hours     Signed:  Hadli Vandemark  [facility] 12/31/2013, 12:26 PM

## 2013-12-31 NOTE — Progress Notes (Signed)
Physical Therapy Treatment Patient Details Name: [name] MRN: [id number] DOB: [birth year] Today's Date: 12/31/2013 Time: 5621-3086 PT Time Calculation (min): 26 min  PT Assessment / Plan / Recommendation  History of Present Illness 79 y.o. female with history of diastolic heart failure, severe mitral regurgitation no surgical candidate, HL and hypothyroidism. She was admitted earlier in the week  from HF clinic with new onset A fib. She converted to NSR before amiodarone started. She was given amiodarone bolus and started on amiodarone 400 mg bid as well as eliquis. She was discharged yesterday but returned to ER several hours later with worsening dyspnea.  Dx with acute diastolyic CHF exacerbation.     PT Comments   Pt is progressing well with her therapy and was able to tolerate sitting exercises after gait.  DOE with 3/4 with gait managed with standing rest break, but O2 sats stable with 2 L O2 [place] (this is her normal home level).  Pt ready to go home with family's assist.    Follow Up Recommendations  No PT follow up;Supervision - Intermittent     Does the patient have the potential to tolerate intense rehabilitation    NA  Barriers to Discharge   None, good family support.      Equipment Recommendations  None recommended by PT    Recommendations for Other Services   NA  Frequency Min 3X/week   Progress towards PT Goals Progress towards PT goals: Progressing toward goals  Plan Current plan remains appropriate    Precautions / Restrictions Precautions Precautions: Fall Precaution Comments: wears O2 2 L at home all the time   Pertinent Vitals/Pain Hr 60s at rest, O2 sats 98% on 2 L O2 [place] at rest, HR 106 with gait , O2 sats 93% with gait on 2 L O2 [place], HR back down to 60s after walking seated and O2 sats 96% on 2 L O2 [place].     Mobility  Transfers Overall transfer level: Needs assistance Equipment used: Rolling walker (2 wheeled) Transfers: Sit to/from Stand Sit to Stand:  Supervision General transfer comment: supervision for safety during transitions.  Verbal cues for safe hand placement.  Ambulation/Gait Ambulation/Gait assistance: Supervision Ambulation Distance (Feet): 120 Feet Assistive device: Rolling walker (2 wheeled) Gait Pattern/deviations: Step-through pattern;Shuffle;Trunk flexed Gait velocity: decreased Gait velocity interpretation: Below normal speed for age/gender General Gait Details: Needed one standing rest break due to 3/4 DOE (despite O2 sats 93 % on 2 L O2 [place], HR did increase to 103 bpm during gait from 60s).  Supervision for safety during gait.     Exercises General Exercises - Upper Extremity Shoulder Flexion: AROM;Both;10 reps;Seated Elbow Flexion: AROM;Both;10 reps;Seated General Exercises - Lower Extremity Long Arc Quad: AROM;Both;10 reps;Seated Hip ABduction/ADduction: AROM;Both;10 reps;Seated (adduct against pillow) Hip Flexion/Marching: AROM;Both;10 reps;Seated Toe Raises: AROM;Both;10 reps;Seated Heel Raises: AROM;Both;10 reps;Seated Other Exercises Other Exercises: HEP handout provided to pt/daughter to do at home    PT Goals (current goals can now be found in the care plan section) Acute Rehab PT Goals Patient Stated Goal: return home  Visit Information  Last PT Received On: 12/31/13 Assistance Needed: +1 History of Present Illness: 80 y.o. female with history of diastolic heart failure, severe mitral regurgitation no surgical candidate, HL and hypothyroidism. She was admitted earlier in the week  from HF clinic with new onset A fib. She converted to NSR before amiodarone started. She was given amiodarone bolus and started on amiodarone 400 mg bid as well as eliquis. She  was discharged yesterday but returned to ER several hours later with worsening dyspnea.  Dx with acute diastolyic CHF exacerbation.      Subjective Data  Subjective: Pt reports that her legs get weak and her lungs give out on her while walking.   Patient Stated Goal: return home   Cognition  Cognition Arousal/Alertness: Awake/alert Behavior During Therapy: WFL for tasks assessed/performed Overall Cognitive Status: Within Functional Limits for tasks assessed    Balance  Balance Overall balance assessment: Needs assistance Standing balance support: Bilateral upper extremity supported Standing balance-Leahy Scale: Fair  End of Session PT - End of Session Equipment Utilized During Treatment: Gait belt Activity Tolerance: Patient limited by fatigue Patient left: in chair;with call bell/phone within reach;with family/visitor present Nurse Communication: Mobility status     Wells Guiles B. [place], [place], DPT [phone]   12/31/2013, 3:24 PM

## 2014-01-01 ENCOUNTER — Encounter (HOSPITAL_COMMUNITY): Payer: Medicare Other

## 2014-01-02 ENCOUNTER — Other Ambulatory Visit (HOSPITAL_COMMUNITY): Payer: Self-pay

## 2014-01-02 ENCOUNTER — Encounter (HOSPITAL_COMMUNITY): Payer: Medicare Other

## 2014-01-02 MED ORDER — POTASSIUM CHLORIDE 40 MEQ/15ML (20%) PO LIQD: 40.0000 meq | Freq: Two times a day (BID) | ORAL | Status: DC

## 2014-01-03 ENCOUNTER — Ambulatory Visit (HOSPITAL_COMMUNITY)
Admission: RE | Admit: 2014-01-03 | Discharge: 2014-01-03 | Disposition: A | Discharge status: Home / Self Care | Payer: Medicare Other | Source: Ambulatory Visit | Attending: Internal Medicine | Admitting: Internal Medicine

## 2014-01-03 ENCOUNTER — Encounter (HOSPITAL_COMMUNITY): Payer: Self-pay

## 2014-01-03 VITALS — BP 106/62 | HR 55 | Wt 110.8 lb

## 2014-01-03 DIAGNOSIS — I4891 Unspecified atrial fibrillation: Secondary | ICD-10-CM

## 2014-01-03 DIAGNOSIS — R011 Cardiac murmur, unspecified: Secondary | ICD-10-CM | POA: Insufficient documentation

## 2014-01-03 DIAGNOSIS — I509 Heart failure, unspecified: Secondary | ICD-10-CM | POA: Insufficient documentation

## 2014-01-03 DIAGNOSIS — Z79899 Other long term (current) drug therapy: Secondary | ICD-10-CM | POA: Insufficient documentation

## 2014-01-03 DIAGNOSIS — K219 Gastro-esophageal reflux disease without esophagitis: Secondary | ICD-10-CM | POA: Insufficient documentation

## 2014-01-03 DIAGNOSIS — I080 Rheumatic disorders of both mitral and aortic valves: Secondary | ICD-10-CM

## 2014-01-03 DIAGNOSIS — I2510 Atherosclerotic heart disease of native coronary artery without angina pectoris: Secondary | ICD-10-CM | POA: Insufficient documentation

## 2014-01-03 DIAGNOSIS — E039 Hypothyroidism, unspecified: Secondary | ICD-10-CM | POA: Insufficient documentation

## 2014-01-03 DIAGNOSIS — G609 Hereditary and idiopathic neuropathy, unspecified: Secondary | ICD-10-CM | POA: Insufficient documentation

## 2014-01-03 DIAGNOSIS — E785 Hyperlipidemia, unspecified: Secondary | ICD-10-CM | POA: Insufficient documentation

## 2014-01-03 DIAGNOSIS — I5032 Chronic diastolic (congestive) heart failure: Secondary | ICD-10-CM | POA: Insufficient documentation

## 2014-01-03 NOTE — Progress Notes (Signed)
Patient ID: [name], female   DOB: 1918/01/04, 80 y.o.   MRN: [id number]  Primary Cardiologist: Dr. Angelena Form PCP: Dr. Linda Hedges   HPI: [name] is a 80 y.o. female with history of diastolic heart failure, severe mitral regurgitation no surgical candidate, HL and hypothyroidism.      Echo 08/28/12: LVEF 55-60%.  Mild AI, Severe MR.  LA Mod to severely dilated.  PFO/small ASD.  PAPP 70 mmHg.    Discharged from [facility] 09/25/12 after being diuresed a few pounds. She was placed on  continuous 2 liters [place].    Admitted to [facility] 2/23 through 2/25 with Afib RVR. Readmitted 12/27/13 with volume overload. Diuresed with IV lasix. Discharge weight was 110 pounds.    She returns for follow up.  Mild dyspnea at rest and with exertion but feels better.No bleeding problems.  Weight at home 106-107 pounds. Plan for [facility]---> PT. Plan for pessary replacement next Thursday which is usually accompanied by some bleeding.     12/31/13 K 3.9 Cr 1.28  ROS: All systems negative except as listed in HPI, PMH and Problem List.  Past Medical History  Diagnosis Date  . Coronary artery disease   . Peripheral neuropathy   . Aortic insufficiency     mild  . Cerumen impaction   . Unspecified diastolic heart failure   . Hyperlipemia   . Mitral valve prolapse     "severe" (09/22/2012)  . Mitral regurgitation     severe  . Urosepsis   . Anemia, unspecified   . Osteoporosis   . Adenocarcinoma, breast   . Irritable bladder   . Urinary incontinence   . Hypothyroidism   . GERD (gastroesophageal reflux disease)   . H/O: whooping cough   . Hypercholesterolemia     "stopped statins ~ 2 yr ago" (09/22/2012)  . Heart murmur   . CHF (congestive heart failure)   . Shortness of breath     "basically all the time; worse w/exertion" (09/22/2012)  . Bleeding hemorrhoids ~ 2011  . Osteoarthritis     "knees, hips, hands" (09/22/2012)    Current Outpatient Prescriptions  Medication Sig Dispense Refill  . amiodarone  (PACERONE) 400 MG tablet Take 400 mg by mouth See admin instructions. *taking 400mg  twice daily for 5 days, then 200mg  daily thereafter* Started regimen 12/24/13      . apixaban (ELIQUIS) 2.5 MG TABS tablet Take 2.5 mg by mouth 2 (two) times daily.      . calcium-vitamin D (OSCAL WITH D) 500-200 MG-UNIT per tablet Take 1 tablet by mouth 3 (three) times daily.       . carvedilol (COREG) 3.125 MG tablet Take 1 tablet (3.125 mg total) by mouth 2 (two) times daily with a meal.  30 tablet  0  . cycloSPORINE (RESTASIS) 0.05 % ophthalmic emulsion Place 1 drop into both eyes 2 (two) times daily.       . furosemide (LASIX) 40 MG tablet Take 60 mg by mouth 2 (two) times daily.      [name] guaiFENesin (MUCINEX) 600 MG 12 hr tablet Take 1,200 mg by mouth 2 (two) times daily.      . Multiple Vitamin (MULTIVITAMIN) tablet Take 1 tablet by mouth daily. Athruz Select 50+      . Multiple Vitamins-Minerals (PRESERVISION AREDS PO) Take 2 tablets by mouth daily.      [name] omega-3 acid ethyl esters (LOVAZA) 1 G capsule Take 2 g by mouth every evening.      [name]  Polyethyl Glycol-Propyl Glycol (SYSTANE) 0.4-0.3 % GEL Place 1 drop into both eyes at bedtime.      . Polyvinyl Alcohol-Povidone (FRESHKOTE OP) Place 1 drop into both eyes 4 (four) times daily.       . potassium chloride 40 MEQ/15ML (20%) LIQD Take 15 mLs (40 mEq total) by mouth 2 (two) times daily.  600 mL  2   No current facility-administered medications for this encounter.    Filed Vitals:   01/03/14 1319  BP: 106/62  Pulse: 55  Weight: 110 lb 12.8 oz (50.259 kg)  SpO2: 97%   PHYSICAL EXAM: General:  Elderly in wheelchair.  No resp difficulty son and daughter present HEENT: normal Neck: supple. JVP 8 with prominent CV waves. Carotids 2+ bilaterally; no bruits. No lymphadenopathy or thryomegaly appreciated. Cor: s/p L mastectomy. PMI normal. regular rate & irregular rhythm. 3/6 MR murmur. No rubs, gallops. Lungs: clear on 2 liters  Abdomen: soft, nontender,  nondistended. No hepatosplenomegaly. No bruits or masses. Good bowel sounds. Extremities: no cyanosis, clubbing, rash, tr edema Neuro: alert & orientedx3, cranial nerves grossly intact. Moves all 4 extremities w/o difficulty. Affect pleasant.  EKG: NSR 65 BPM  CLEGG,AMY NP-C 1:46 PM   ASSESSMENT & PLAN:  1. AF with RVR - In NSR . Continue amiodarone 200 mg daily.  Will hold Eliquis 24 hours prior to pessary procedure. Resume the night after procedure unless there is ongoing bleeding then can hold until bleeding resolves.   2. Chronic Diastolic Heart Failure:  Volume status much improved. New target weight 106-108. Take metolazone for weight 109 or greater.   Patient seen and examined with Darrick Grinder, NP. We discussed all aspects of the encounter.See my assessment and plan as stated above.   Daniel Bensimhon,MD 2:09 PM

## 2014-01-04 ENCOUNTER — Encounter (HOSPITAL_COMMUNITY): Payer: Medicare Other

## 2014-01-04 DIAGNOSIS — I5032 Chronic diastolic (congestive) heart failure: Secondary | ICD-10-CM

## 2014-01-04 DIAGNOSIS — I4891 Unspecified atrial fibrillation: Secondary | ICD-10-CM

## 2014-01-04 DIAGNOSIS — I509 Heart failure, unspecified: Secondary | ICD-10-CM

## 2014-01-04 DIAGNOSIS — I059 Rheumatic mitral valve disease, unspecified: Secondary | ICD-10-CM

## 2014-01-04 NOTE — Addendum Note (Signed)
Encounter addended by: Evalee Mutton, CCT on: 01/04/2014  9:25 AM<BR>     Documentation filed: Charges VN

## 2014-01-06 ENCOUNTER — Other Ambulatory Visit: Payer: Self-pay | Admitting: Internal Medicine

## 2014-01-09 ENCOUNTER — Other Ambulatory Visit (HOSPITAL_COMMUNITY): Payer: Self-pay | Admitting: Internal Medicine

## 2014-01-10 ENCOUNTER — Other Ambulatory Visit (HOSPITAL_COMMUNITY): Payer: Self-pay

## 2014-01-10 ENCOUNTER — Telehealth (HOSPITAL_COMMUNITY): Payer: Self-pay

## 2014-01-10 MED ORDER — POTASSIUM CHLORIDE 40 MEQ/15ML (20%) PO LIQD: 40.0000 meq | Freq: Three times a day (TID) | ORAL | Status: DC

## 2014-01-10 NOTE — Telephone Encounter (Signed)
Patient's daughter [name] made aware of patient's lab results, instructed to increase potassium frequency to 40 meq TID.  Corrected Rx sent to preferred pharmacy. Reminded of appointment 4/14.  Aware, agreeable, and appreciative of call. Renee Pain

## 2014-01-21 LAB — BASIC METABOLIC PANEL
BUN/Creatinine Ratio: 20 (ref 11–26)
BUN: 25 mg/dL (ref 10–36)
CALCIUM: 9.4 mg/dL (ref 8.7–10.3)
CHLORIDE: 88 mmol/L — AB (ref 97–108)
CO2: 31 mmol/L — ABNORMAL HIGH (ref 18–29)
Creatinine, Ser: 1.25 mg/dL — ABNORMAL HIGH (ref 0.57–1.00)
GFR calc non Af Amer: 37 mL/min/{1.73_m2} — ABNORMAL LOW (ref >59)
GFR, EST AFRICAN AMERICAN: 43 mL/min/{1.73_m2} — AB (ref >59)
GLUCOSE: 128 mg/dL — AB (ref 65–99)
POTASSIUM: 3.4 mmol/L — AB (ref 3.5–5.2)
Sodium: 139 mmol/L (ref 134–144)

## 2014-01-22 ENCOUNTER — Telehealth (HOSPITAL_COMMUNITY): Payer: Self-pay

## 2014-01-22 NOTE — Telephone Encounter (Signed)
Attempted to reach patient and/or her daughter regarding lab results.  Needs to take 1 extra 20 meq potassium.  Left message to call back.  Will reattempt first in the morning. Renee Pain

## 2014-01-23 ENCOUNTER — Other Ambulatory Visit (HOSPITAL_COMMUNITY): Payer: Self-pay

## 2014-01-23 ENCOUNTER — Telehealth (HOSPITAL_COMMUNITY): Payer: Self-pay

## 2014-01-23 MED ORDER — POTASSIUM CHLORIDE ER 10 MEQ PO TBCR: 40.0000 meq | EXTENDED_RELEASE_TABLET | Freq: Three times a day (TID) | ORAL | Status: DC

## 2014-01-23 NOTE — Telephone Encounter (Signed)
Patient's daughter called to make aware of patient's lab results, instructed to take extra 20 meq of potassium.  Patient is having hard time tolerating taste of liquid potassium, new Rx for 32meq tabs sent to preferred pharmacy to try.  Daughter will let us know if this is a better option or not.  Aware and agreeable. Renee Pain

## 2014-01-24 ENCOUNTER — Telehealth (HOSPITAL_COMMUNITY): Payer: Self-pay | Admitting: Cardiology

## 2014-01-24 NOTE — Telephone Encounter (Signed)
Called and talked to daughter who reports mom has some head congestion and feels like she may be a little SOB laying down. Hard to explain symptoms. Reports that nurse told her she had some crackles in her chest. Weight stable. Will have her take 2.5 mg metolazone today with extra 20 meq of potassium to see if helps but to follow up with PCP, maybe seasonal allergies. Call if worse or weight trending up.  [name] NP-C 5:09 PM

## 2014-01-24 NOTE — Telephone Encounter (Signed)
[name] called after home health visit Vitals and weight are both stable Pt feels great HOWEVER pt is having a lot of chest congestion and is very "junky" ?neb ?cxr Please advise  Also clarified BMET order- pt should have BMET rechecked next week to follow Potassium

## 2014-02-04 ENCOUNTER — Encounter: Payer: Self-pay | Admitting: Internal Medicine

## 2014-02-12 ENCOUNTER — Ambulatory Visit (HOSPITAL_COMMUNITY)
Admission: RE | Admit: 2014-02-12 | Discharge: 2014-02-12 | Disposition: A | Discharge status: Home / Self Care | Payer: Medicare Other | Source: Ambulatory Visit | Attending: Internal Medicine | Admitting: Internal Medicine

## 2014-02-12 VITALS — BP 108/64 | HR 65 | Wt 107.0 lb

## 2014-02-12 DIAGNOSIS — I5032 Chronic diastolic (congestive) heart failure: Secondary | ICD-10-CM | POA: Insufficient documentation

## 2014-02-12 LAB — BASIC METABOLIC PANEL
BUN: 18 mg/dL (ref 6–23)
CALCIUM: 9.5 mg/dL (ref 8.4–10.5)
CO2: 31 mEq/L (ref 19–32)
Chloride: 96 mEq/L (ref 96–112)
Creatinine, Ser: 1.14 mg/dL — ABNORMAL HIGH (ref 0.50–1.10)
GFR calc non Af Amer: 40 mL/min — ABNORMAL LOW (ref >90)
GFR, EST AFRICAN AMERICAN: 46 mL/min — AB (ref >90)
GLUCOSE: 90 mg/dL (ref 70–99)
POTASSIUM: 4.5 meq/L (ref 3.7–5.3)
Sodium: 138 mEq/L (ref 137–147)

## 2014-02-12 MED ORDER — AMIODARONE HCL 200 MG PO TABS: 200.0000 mg | ORAL_TABLET | Freq: Every day | ORAL | Status: DC

## 2014-02-12 NOTE — Progress Notes (Signed)
Patient ID: [name], female   DOB: 08-24-1918, 80 y.o.   MRN: [id number]  Primary Cardiologist: Dr. Angelena Form PCP: Dr. Linda Hedges   HPI: [name] is a 80 y.o. female with history of diastolic heart failure, severe mitral regurgitation no surgical candidate, HL and hypothyroidism.      Echo 08/28/12: LVEF 55-60%.  Mild AI, Severe MR.  LA Mod to severely dilated.  PFO/small ASD.  PAPP 70 mmHg.    Discharged from [facility] 09/25/12 after being diuresed a few pounds. She was placed on  continuous 2 liters [place].    Admitted to [facility] 2/23 through 2/25 with Afib RVR. Readmitted 12/27/13 with volume overload. Diuresed with IV lasix. Discharge weight was 110 pounds.    She returns for follow up with her son and daughter. Breathing is at her baseline. Tolerated pessary procedures. .No bleeding problems.  Weight at home 103-105 pounds. Daughter prepares  Plan for [facility]---> PT. Complaint with medications.   Labs  12/31/13 K 3.9 Cr 1.28 01/09/14 K 3.4 Creatinine 1.25   ROS: All systems negative except as listed in HPI, PMH and Problem List.  Past Medical History  Diagnosis Date  . Coronary artery disease   . Peripheral neuropathy   . Aortic insufficiency     mild  . Cerumen impaction   . Unspecified diastolic heart failure   . Hyperlipemia   . Mitral valve prolapse     "severe" (09/22/2012)  . Mitral regurgitation     severe  . Urosepsis   . Anemia, unspecified   . Osteoporosis   . Adenocarcinoma, breast   . Irritable bladder   . Urinary incontinence   . Hypothyroidism   . GERD (gastroesophageal reflux disease)   . H/O: whooping cough   . Hypercholesterolemia     "stopped statins ~ 2 yr ago" (09/22/2012)  . Heart murmur   . CHF (congestive heart failure)   . Shortness of breath     "basically all the time; worse w/exertion" (09/22/2012)  . Bleeding hemorrhoids ~ 2011  . Osteoarthritis     "knees, hips, hands" (09/22/2012)    Current Outpatient Prescriptions  Medication Sig Dispense  Refill  . amiodarone (PACERONE) 400 MG tablet Take 200 mg by mouth daily.       [name] apixaban (ELIQUIS) 2.5 MG TABS tablet Take 2.5 mg by mouth 2 (two) times daily.      . calcium-vitamin D (OSCAL WITH D) 500-200 MG-UNIT per tablet Take 1 tablet by mouth 3 (three) times daily.       . carvedilol (COREG) 3.125 MG tablet Take 1 tablet (3.125 mg total) by mouth 2 (two) times daily with a meal.  60 tablet  5  . cycloSPORINE (RESTASIS) 0.05 % ophthalmic emulsion Place 1 drop into both eyes 2 (two) times daily.       . furosemide (LASIX) 40 MG tablet Take 60 mg by mouth 2 (two) times daily.      [name] guaiFENesin (MUCINEX) 600 MG 12 hr tablet Take 1,200 mg by mouth 2 (two) times daily.      . Multiple Vitamin (MULTIVITAMIN) tablet Take 1 tablet by mouth daily. Athruz Select 50+      . Multiple Vitamins-Minerals (PRESERVISION AREDS PO) Take 2 tablets by mouth daily.      [name] omega-3 acid ethyl esters (LOVAZA) 1 G capsule Take 2 g by mouth every evening.      Vladimir Faster Glycol-Propyl Glycol (SYSTANE) 0.4-0.3 % GEL Place 1 drop into both eyes  at bedtime.      . Polyvinyl Alcohol-Povidone (FRESHKOTE OP) Place 1 drop into both eyes 4 (four) times daily.       . potassium chloride (K-DUR) 10 MEQ tablet Take 4 tablets (40 mEq total) by mouth 3 (three) times daily.  360 tablet  3   No current facility-administered medications for this encounter.    Filed Vitals:   02/12/14 1432  BP: 108/64  Pulse: 65  Weight: 107 lb (48.535 kg)  SpO2: 95%   PHYSICAL EXAM: General:  Elderly in wheelchair.  No resp difficulty son and daughter present HEENT: normal Neck: supple. JVP 6-7 with prominent CV waves. Carotids 2+ bilaterally; no bruits. No lymphadenopathy or thryomegaly appreciated. Cor: s/p L mastectomy. PMI normal. regular rate & irregular rhythm. 3/6 MR murmur. No rubs, gallops. Lungs: clear on 2 liters [place] Abdomen: soft, nontender, nondistended. No hepatosplenomegaly. No bruits or masses. Good bowel  sounds. Extremities: no cyanosis, clubbing, rash, tr edema Neuro: alert & orientedx3, cranial nerves grossly intact. Moves all 4 extremities w/o difficulty. Affect pleasant.   ASSESSMENT & PLAN:  1. AF with RVR - By exan rhythm is regular. Continue amiodarone 200 mg daily.  Cotninue 2.5 mg eilquis.twice a day.   2. Chronic Diastolic Heart Failure:  Volume status stable.  Continue lasix 60 mg twice a day. Target for weight is 106-108 pounds. Take metolazone for weight 109 or greater. Hold lasix and potassium for weight 100 pounds or less. Check BMET today. -->K 4.5 Creatinine 1.14  Follow up in 3 months  Amy D Clegg, NP-C  2:35 PM

## 2014-02-12 NOTE — Patient Instructions (Addendum)
Follow up 3 months   If weight drops to 100 pounds. Please hold lasix and potassium    Do the following things EVERYDAY: 1) Weigh yourself in the morning before breakfast. Write it down and keep it in a log. 2) Take your medicines as prescribed 3) Eat low salt foods-Limit salt (sodium) to 2000 mg per day.  4) Stay as active as you can everyday 5) Limit all fluids for the day to less than 2 liters

## 2014-02-19 ENCOUNTER — Telehealth (HOSPITAL_COMMUNITY): Payer: Self-pay | Admitting: *Deleted

## 2014-02-19 NOTE — Telephone Encounter (Signed)
Eliquis needed PA, called [employer] at [phone] (id # [id number]) med approved from 02/14/14-02/15/15, pt's daughter aware

## 2014-02-22 ENCOUNTER — Encounter: Payer: Self-pay | Admitting: Internal Medicine

## 2014-03-22 ENCOUNTER — Telehealth (HOSPITAL_COMMUNITY): Payer: Self-pay | Admitting: *Deleted

## 2014-03-22 NOTE — Telephone Encounter (Signed)
Pt's daughter called very concerned about pt, she reports she hasn't been feeling well since the weekend.  She states pt is much more fatigued and weak than usual, she has been having to help pt get up and down which is new.  She reports wt last Fri was 105 and today 103.  She also states pt seems to be more SOB and she has increased her oxygen to 2.5-3 L sats 98%, HR usually runs 60s but has been in the 70s since Tue, BP stable at 104/49, no appetite denies edema, n/v.  Discussed with Dr Haroldine Laws he does not feel heart related as wt and HR is down and she has no edema, he would recommend f/u with pcp.  Spoke with pt's daughter she is aware and will take pt to pcp or urgent care.

## 2014-03-29 ENCOUNTER — Encounter: Payer: Self-pay | Admitting: Internal Medicine

## 2014-03-29 ENCOUNTER — Ambulatory Visit (INDEPENDENT_AMBULATORY_CARE_PROVIDER_SITE_OTHER): Payer: Medicare Other | Admitting: Internal Medicine

## 2014-03-29 ENCOUNTER — Other Ambulatory Visit (INDEPENDENT_AMBULATORY_CARE_PROVIDER_SITE_OTHER): Payer: Medicare Other

## 2014-03-29 VITALS — BP 108/50 | HR 68 | Temp 97.5°F | Wt 102.8 lb

## 2014-03-29 DIAGNOSIS — R946 Abnormal results of thyroid function studies: Secondary | ICD-10-CM

## 2014-03-29 DIAGNOSIS — R627 Adult failure to thrive: Secondary | ICD-10-CM

## 2014-03-29 DIAGNOSIS — R634 Abnormal weight loss: Secondary | ICD-10-CM

## 2014-03-29 DIAGNOSIS — R630 Anorexia: Secondary | ICD-10-CM

## 2014-03-29 LAB — CBC WITH DIFFERENTIAL/PLATELET
BASOS PCT: 0.1 % (ref 0.0–3.0)
Basophils Absolute: 0 10*3/uL (ref 0.0–0.1)
EOS PCT: 3.6 % (ref 0.0–5.0)
Eosinophils Absolute: 0.3 10*3/uL (ref 0.0–0.7)
HCT: 31.1 % — ABNORMAL LOW (ref 36.0–46.0)
Hemoglobin: 10.3 g/dL — ABNORMAL LOW (ref 12.0–15.0)
Lymphocytes Relative: 12.2 % (ref 12.0–46.0)
Lymphs Abs: 1.1 10*3/uL (ref 0.7–4.0)
MCHC: 33.2 g/dL (ref 30.0–36.0)
MCV: 91.9 fl (ref 78.0–100.0)
MONOS PCT: 8.3 % (ref 3.0–12.0)
Monocytes Absolute: 0.8 10*3/uL (ref 0.1–1.0)
NEUTROS PCT: 75.8 % (ref 43.0–77.0)
Neutro Abs: 6.9 10*3/uL (ref 1.4–7.7)
PLATELETS: 274 10*3/uL (ref 150.0–400.0)
RBC: 3.38 Mil/uL — AB (ref 3.87–5.11)
RDW: 15.8 % — ABNORMAL HIGH (ref 11.5–15.5)
WBC: 9.2 10*3/uL (ref 4.0–10.5)

## 2014-03-29 NOTE — Patient Instructions (Signed)
Your next office appointment will be determined based upon review of your pending labs . Those instructions will be transmitted to you through My Chart  . Stop calcium & vitamin D supplements.

## 2014-03-29 NOTE — Progress Notes (Signed)
   Subjective:    Patient ID: [name], female    DOB: [birth year], 80 y.o.   MRN: [id number]  HPI She is here with failure to thrive type picture manifested by loss of appetite which is a chronic issue but worse the last 2 weeks with accelerated weight loss of 3 pounds in the last week  She describes dyspnea with any exertion and even sometimes at rest.  She has minimal sputum production. She's on Mucinex in an effort to thin the secretions so she can expectorate them.  She is on supplemental oxygen which has ameliorated her  paroxysmal nocturnal dyspnea  She does have some rectal bleeding attributed to hemorrhoids.  She is followed in the heart clinic; the protocol was reviewed. She is on amiodarone; her last TSH was 6.38 on February She is on 60 mg of furosemide twice a day. Potassium is given as 40 electrolytes 3 times a day. Her daughter dispenses this in applesauce.  She does have a living will in place. She is DO NOT RESUSCITATE    Review of Systems  She denies fever, chills, or sweats  She has no blurred vision, double vision, or loss of vision  She has no extrinsic symptoms or angioedema.  She's had no hoarseness or difficulty swallowing.  At this time she denies chest pain or change in heart rate.  She has no bowel changes of  Constipation or diarrhea there's been no melena despite the history of rectal bleeding  She does have some diffuse muscle and joint pain without associated swelling or redness.  There's been no new change in her hair, skin, nails  There is no description of apnea or excessive snoring     Objective:   Physical Exam   She is a delightful very deaf, birdlike lady who sits in a wheelchair. She appears suboptimally nourished.  She has bilateral hearing aids but still ha significant hearing deficit..  She has no lymphadenopathy about the neck or axilla  Thyroid is normal to palpation. Accentuated lordosis of the thoracic  spine  She has a accentuated S2. Loud S4 is noted  Abdomen is doughy & protuberant but nontender  Pedal pulses are decreased but present  No edema is noted  She has no jaundice or scleral icterus.        Assessment & Plan:   #1 adult failure to thrive  #2 accelerated weight loss in the context of anorexia  #3 abnormal thyroid function tests in the context of amiodarone therapy  Plan: See orders .She has been asked to discontinue the vitamin D and calcium as this is superfluous. Attempts will be made to discontinue as many medications as possible not mandated by her chronic systolic heart failure.

## 2014-03-29 NOTE — Progress Notes (Signed)
Pre visit review using our clinic review tool, if applicable. No additional management support is needed unless otherwise documented below in the visit note. 

## 2014-03-31 ENCOUNTER — Encounter: Payer: Self-pay | Admitting: Internal Medicine

## 2014-04-01 ENCOUNTER — Telehealth (HOSPITAL_COMMUNITY): Payer: Self-pay | Admitting: Cardiology

## 2014-04-01 LAB — HEPATIC FUNCTION PANEL
ALT: 25 U/L (ref 0–35)
AST: 36 U/L (ref 0–37)
Albumin: 2.7 g/dL — ABNORMAL LOW (ref 3.5–5.2)
Alkaline Phosphatase: 70 U/L (ref 39–117)
BILIRUBIN DIRECT: 0.2 mg/dL (ref 0.0–0.3)
BILIRUBIN TOTAL: 0.9 mg/dL (ref 0.2–1.2)
TOTAL PROTEIN: 7.6 g/dL (ref 6.0–8.3)

## 2014-04-01 LAB — BASIC METABOLIC PANEL
BUN: 28 mg/dL — AB (ref 6–23)
CO2: 25 mEq/L (ref 19–32)
Calcium: 9.2 mg/dL (ref 8.4–10.5)
Chloride: 104 mEq/L (ref 96–112)
Creatinine, Ser: 1.3 mg/dL — ABNORMAL HIGH (ref 0.4–1.2)
GFR: 41.25 mL/min — AB (ref >60.00)
Glucose, Bld: 108 mg/dL — ABNORMAL HIGH (ref 70–99)
POTASSIUM: 3.9 meq/L (ref 3.5–5.1)
Sodium: 142 mEq/L (ref 135–145)

## 2014-04-01 LAB — T3, FREE: T3, Free: 1.6 pg/mL — ABNORMAL LOW (ref 2.3–4.2)

## 2014-04-01 LAB — T4, FREE: Free T4: 0.97 ng/dL (ref 0.60–1.60)

## 2014-04-01 LAB — TSH: TSH: 6.46 u[IU]/mL — ABNORMAL HIGH (ref 0.35–4.50)

## 2014-04-01 NOTE — Telephone Encounter (Signed)
Spoke w/pt's daughter per last OV hold Lasix and KCL if wt is 100lbs or less, daughter states she has still been giving both meds, advised her to hold both until wt comes back up to 100 lbs, will c/b as needed

## 2014-04-01 NOTE — Telephone Encounter (Signed)
[name], called Friday 03/29/14 with concerns of decreased weight, states pt may need urinalysis and urine cx to rule out uti. Pt seen by PCP 5/29, labs done and family is awaiting return call from [place] with [name] today and pts weight is still decreasing (98.8lb on5/31/15) O2 Sat 97-98% b/p 112/54 Increased thirst and fatigue Currently taking meds as precsribed  Please advise further if needed

## 2014-04-02 ENCOUNTER — Encounter: Payer: Self-pay | Admitting: Internal Medicine

## 2014-04-04 ENCOUNTER — Telehealth: Payer: Self-pay

## 2014-04-04 ENCOUNTER — Ambulatory Visit: Payer: Medicare Other

## 2014-04-04 ENCOUNTER — Other Ambulatory Visit: Payer: Self-pay | Admitting: Internal Medicine

## 2014-04-04 DIAGNOSIS — R946 Abnormal results of thyroid function studies: Secondary | ICD-10-CM | POA: Insufficient documentation

## 2014-04-04 DIAGNOSIS — R7309 Other abnormal glucose: Secondary | ICD-10-CM

## 2014-04-04 LAB — HEMOGLOBIN A1C: Hgb A1c MFr Bld: 6 % (ref 4.6–6.5)

## 2014-04-04 NOTE — Telephone Encounter (Signed)
Add on request has been faxed to lab 

## 2014-04-04 NOTE — Telephone Encounter (Signed)
Message copied by Shelly Coss on Thu Apr 04, 2014  9:34 AM ------      Message from: Hendricks Limes      Created: Thu Apr 04, 2014  7:15 AM       Please add A1c (790.29)       ------

## 2014-04-12 ENCOUNTER — Telehealth (HOSPITAL_COMMUNITY): Payer: Self-pay | Admitting: Cardiology

## 2014-04-12 NOTE — Telephone Encounter (Signed)
Daughter called with questions regarding restarting Lasix as pts weight this AM was 100.4 Advised ok to restart Lasix if she feels pt really needs meds, Pt does have increased SOB, mild LE edema  ok to restart at previous dose 60/60 per last ov with CHF clinic  Will call back Monday regarding other med changes

## 2014-04-13 ENCOUNTER — Inpatient Hospital Stay (HOSPITAL_COMMUNITY)
Admission: EM | Admit: 2014-04-13 | Discharge: 2014-04-16 | DRG: 291 | Disposition: A | Discharge status: Home Health | Payer: Medicare Other | Attending: Internal Medicine | Admitting: Internal Medicine

## 2014-04-13 ENCOUNTER — Emergency Department (HOSPITAL_COMMUNITY): Payer: Medicare Other

## 2014-04-13 ENCOUNTER — Encounter (HOSPITAL_COMMUNITY): Payer: Self-pay | Admitting: Emergency Medicine

## 2014-04-13 DIAGNOSIS — E785 Hyperlipidemia, unspecified: Secondary | ICD-10-CM | POA: Diagnosis present

## 2014-04-13 DIAGNOSIS — Z803 Family history of malignant neoplasm of breast: Secondary | ICD-10-CM

## 2014-04-13 DIAGNOSIS — D649 Anemia, unspecified: Secondary | ICD-10-CM | POA: Diagnosis present

## 2014-04-13 DIAGNOSIS — I5031 Acute diastolic (congestive) heart failure: Secondary | ICD-10-CM

## 2014-04-13 DIAGNOSIS — E875 Hyperkalemia: Secondary | ICD-10-CM | POA: Diagnosis present

## 2014-04-13 DIAGNOSIS — Z862 Personal history of diseases of the blood and blood-forming organs and certain disorders involving the immune mechanism: Secondary | ICD-10-CM

## 2014-04-13 DIAGNOSIS — Z66 Do not resuscitate: Secondary | ICD-10-CM | POA: Diagnosis present

## 2014-04-13 DIAGNOSIS — I5021 Acute systolic (congestive) heart failure: Secondary | ICD-10-CM | POA: Insufficient documentation

## 2014-04-13 DIAGNOSIS — Z9849 Cataract extraction status, unspecified eye: Secondary | ICD-10-CM

## 2014-04-13 DIAGNOSIS — Z9010 Acquired absence of unspecified breast and nipple: Secondary | ICD-10-CM

## 2014-04-13 DIAGNOSIS — I2510 Atherosclerotic heart disease of native coronary artery without angina pectoris: Secondary | ICD-10-CM | POA: Diagnosis present

## 2014-04-13 DIAGNOSIS — E43 Unspecified severe protein-calorie malnutrition: Secondary | ICD-10-CM | POA: Insufficient documentation

## 2014-04-13 DIAGNOSIS — I5033 Acute on chronic diastolic (congestive) heart failure: Secondary | ICD-10-CM

## 2014-04-13 DIAGNOSIS — M810 Age-related osteoporosis without current pathological fracture: Secondary | ICD-10-CM | POA: Diagnosis present

## 2014-04-13 DIAGNOSIS — N183 Chronic kidney disease, stage 3 unspecified: Secondary | ICD-10-CM | POA: Diagnosis present

## 2014-04-13 DIAGNOSIS — I4891 Unspecified atrial fibrillation: Secondary | ICD-10-CM | POA: Diagnosis present

## 2014-04-13 DIAGNOSIS — IMO0002 Reserved for concepts with insufficient information to code with codable children: Secondary | ICD-10-CM

## 2014-04-13 DIAGNOSIS — Z79899 Other long term (current) drug therapy: Secondary | ICD-10-CM

## 2014-04-13 DIAGNOSIS — D6489 Other specified anemias: Secondary | ICD-10-CM | POA: Diagnosis present

## 2014-04-13 DIAGNOSIS — R946 Abnormal results of thyroid function studies: Secondary | ICD-10-CM

## 2014-04-13 DIAGNOSIS — R64 Cachexia: Secondary | ICD-10-CM | POA: Diagnosis present

## 2014-04-13 DIAGNOSIS — I080 Rheumatic disorders of both mitral and aortic valves: Secondary | ICD-10-CM | POA: Diagnosis present

## 2014-04-13 DIAGNOSIS — Z853 Personal history of malignant neoplasm of breast: Secondary | ICD-10-CM

## 2014-04-13 DIAGNOSIS — T462X1A Poisoning by other antidysrhythmic drugs, accidental (unintentional), initial encounter: Secondary | ICD-10-CM

## 2014-04-13 DIAGNOSIS — E032 Hypothyroidism due to medicaments and other exogenous substances: Secondary | ICD-10-CM | POA: Diagnosis present

## 2014-04-13 DIAGNOSIS — J9620 Acute and chronic respiratory failure, unspecified whether with hypoxia or hypercapnia: Secondary | ICD-10-CM | POA: Diagnosis present

## 2014-04-13 DIAGNOSIS — E039 Hypothyroidism, unspecified: Secondary | ICD-10-CM | POA: Diagnosis present

## 2014-04-13 DIAGNOSIS — E7800 Pure hypercholesterolemia, unspecified: Secondary | ICD-10-CM | POA: Diagnosis present

## 2014-04-13 DIAGNOSIS — Z823 Family history of stroke: Secondary | ICD-10-CM

## 2014-04-13 DIAGNOSIS — N184 Chronic kidney disease, stage 4 (severe): Secondary | ICD-10-CM | POA: Diagnosis present

## 2014-04-13 DIAGNOSIS — I38 Endocarditis, valve unspecified: Secondary | ICD-10-CM | POA: Diagnosis present

## 2014-04-13 DIAGNOSIS — Z961 Presence of intraocular lens: Secondary | ICD-10-CM

## 2014-04-13 DIAGNOSIS — Z820 Family history of epilepsy and other diseases of the nervous system: Secondary | ICD-10-CM

## 2014-04-13 DIAGNOSIS — Z8639 Personal history of other endocrine, nutritional and metabolic disease: Secondary | ICD-10-CM

## 2014-04-13 DIAGNOSIS — Z9089 Acquired absence of other organs: Secondary | ICD-10-CM

## 2014-04-13 DIAGNOSIS — Z8249 Family history of ischemic heart disease and other diseases of the circulatory system: Secondary | ICD-10-CM

## 2014-04-13 DIAGNOSIS — G609 Hereditary and idiopathic neuropathy, unspecified: Secondary | ICD-10-CM | POA: Diagnosis present

## 2014-04-13 DIAGNOSIS — Z7901 Long term (current) use of anticoagulants: Secondary | ICD-10-CM

## 2014-04-13 DIAGNOSIS — I5032 Chronic diastolic (congestive) heart failure: Secondary | ICD-10-CM

## 2014-04-13 DIAGNOSIS — K219 Gastro-esophageal reflux disease without esophagitis: Secondary | ICD-10-CM | POA: Diagnosis present

## 2014-04-13 DIAGNOSIS — I509 Heart failure, unspecified: Secondary | ICD-10-CM | POA: Diagnosis present

## 2014-04-13 LAB — I-STAT CHEM 8, ED
BUN: 37 mg/dL — AB (ref 6–23)
BUN: 33 mg/dL — ABNORMAL HIGH (ref 6–23)
CHLORIDE: 107 meq/L (ref 96–112)
CREATININE: 1.3 mg/dL — AB (ref 0.50–1.10)
Calcium, Ion: 1.28 mmol/L (ref 1.13–1.30)
Calcium, Ion: 1.23 mmol/L (ref 1.13–1.30)
Chloride: 107 mEq/L (ref 96–112)
Creatinine, Ser: 1.4 mg/dL — ABNORMAL HIGH (ref 0.50–1.10)
GLUCOSE: 186 mg/dL — AB (ref 70–99)
GLUCOSE: 151 mg/dL — AB (ref 70–99)
HCT: 36 % (ref 36.0–46.0)
HCT: 35 % — ABNORMAL LOW (ref 36.0–46.0)
HEMOGLOBIN: 11.9 g/dL — AB (ref 12.0–15.0)
Hemoglobin: 12.2 g/dL (ref 12.0–15.0)
Potassium: 6 mEq/L — ABNORMAL HIGH (ref 3.7–5.3)
Potassium: 5.7 mEq/L — ABNORMAL HIGH (ref 3.7–5.3)
Sodium: 140 mEq/L (ref 137–147)
Sodium: 141 mEq/L (ref 137–147)
TCO2: 23 mmol/L (ref 0–100)
TCO2: 23 mmol/L (ref 0–100)

## 2014-04-13 LAB — CBC
HEMATOCRIT: 33.1 % — AB (ref 36.0–46.0)
HEMOGLOBIN: 10.3 g/dL — AB (ref 12.0–15.0)
MCH: 29.8 pg (ref 26.0–34.0)
MCHC: 31.1 g/dL (ref 30.0–36.0)
MCV: 95.7 fL (ref 78.0–100.0)
Platelets: 255 10*3/uL (ref 150–400)
RBC: 3.46 MIL/uL — AB (ref 3.87–5.11)
RDW: 16.6 % — ABNORMAL HIGH (ref 11.5–15.5)
WBC: 9 10*3/uL (ref 4.0–10.5)

## 2014-04-13 LAB — BASIC METABOLIC PANEL
BUN: 34 mg/dL — AB (ref 6–23)
CHLORIDE: 101 meq/L (ref 96–112)
CO2: 21 meq/L (ref 19–32)
Calcium: 9.7 mg/dL (ref 8.4–10.5)
Creatinine, Ser: 1.25 mg/dL — ABNORMAL HIGH (ref 0.50–1.10)
GFR calc Af Amer: 41 mL/min — ABNORMAL LOW (ref >90)
GFR, EST NON AFRICAN AMERICAN: 36 mL/min — AB (ref >90)
GLUCOSE: 191 mg/dL — AB (ref 70–99)
POTASSIUM: 6.2 meq/L — AB (ref 3.7–5.3)
SODIUM: 137 meq/L (ref 137–147)

## 2014-04-13 LAB — PRO B NATRIURETIC PEPTIDE: PRO B NATRI PEPTIDE: 21340 pg/mL — AB (ref 0–450)

## 2014-04-13 LAB — I-STAT TROPONIN, ED: Troponin i, poc: 0.03 ng/mL (ref 0.00–0.08)

## 2014-04-13 LAB — I-STAT CG4 LACTIC ACID, ED: Lactic Acid, Venous: 2.2 mmol/L (ref 0.5–2.2)

## 2014-04-13 MED ORDER — FUROSEMIDE 10 MG/ML IJ SOLN
60.0000 mg | Freq: Once | INTRAMUSCULAR | Status: AC
  Administered 2014-04-13: 60 mg via INTRAVENOUS
  Filled 2014-04-13: qty 6

## 2014-04-13 MED ORDER — FUROSEMIDE 10 MG/ML IJ SOLN
40.0000 mg | Freq: Two times a day (BID) | INTRAMUSCULAR | Status: DC
  Administered 2014-04-14: 40 mg via INTRAVENOUS
  Filled 2014-04-13 (×3): qty 4

## 2014-04-13 MED ORDER — NITROFURANTOIN MACROCRYSTAL 50 MG PO CAPS
50.0000 mg | ORAL_CAPSULE | Freq: Every day | ORAL | Status: DC
  Filled 2014-04-13: qty 1

## 2014-04-13 MED ORDER — CYCLOSPORINE 0.05 % OP EMUL
1.0000 [drp] | Freq: Two times a day (BID) | OPHTHALMIC | Status: DC
  Administered 2014-04-14 – 2014-04-16 (×5): 1 [drp] via OPHTHALMIC
  Filled 2014-04-13 (×6): qty 1

## 2014-04-13 MED ORDER — SODIUM CHLORIDE 0.9 % IV SOLN: 250.0000 mL | INTRAVENOUS | Status: DC | PRN

## 2014-04-13 MED ORDER — GUAIFENESIN ER 600 MG PO TB12
1200.0000 mg | ORAL_TABLET | Freq: Two times a day (BID) | ORAL | Status: DC
  Administered 2014-04-14 – 2014-04-16 (×5): 1200 mg via ORAL
  Filled 2014-04-13 (×6): qty 2

## 2014-04-13 MED ORDER — PRESERVISION AREDS 2 PO CAPS: 1.0000 | ORAL_CAPSULE | Freq: Two times a day (BID) | ORAL | Status: DC

## 2014-04-13 MED ORDER — APIXABAN 2.5 MG PO TABS
2.5000 mg | ORAL_TABLET | Freq: Two times a day (BID) | ORAL | Status: DC
  Administered 2014-04-14 – 2014-04-16 (×5): 2.5 mg via ORAL
  Filled 2014-04-13 (×6): qty 1

## 2014-04-13 MED ORDER — SODIUM POLYSTYRENE SULFONATE 15 GM/60ML PO SUSP
15.0000 g | Freq: Once | ORAL | Status: AC
  Administered 2014-04-13: 15 g via ORAL
  Filled 2014-04-13: qty 60

## 2014-04-13 MED ORDER — SODIUM BICARBONATE 8.4 % IV SOLN
50.0000 meq | Freq: Once | INTRAVENOUS | Status: AC
  Administered 2014-04-13: 50 meq via INTRAVENOUS
  Filled 2014-04-13: qty 50

## 2014-04-13 MED ORDER — POLYETHYL GLYCOL-PROPYL GLYCOL 0.4-0.3 % OP GEL: 1.0000 [drp] | Freq: Every day | OPHTHALMIC | Status: DC

## 2014-04-13 MED ORDER — SODIUM CHLORIDE 0.9 % IJ SOLN
3.0000 mL | INTRAMUSCULAR | Status: DC | PRN
  Administered 2014-04-14: 3 mL via INTRAVENOUS

## 2014-04-13 MED ORDER — ADULT MULTIVITAMIN W/MINERALS CH
1.0000 | ORAL_TABLET | Freq: Every evening | ORAL | Status: DC
  Administered 2014-04-14 – 2014-04-15 (×2): 1 via ORAL
  Filled 2014-04-13 (×3): qty 1

## 2014-04-13 MED ORDER — CARVEDILOL 3.125 MG PO TABS
3.1250 mg | ORAL_TABLET | Freq: Two times a day (BID) | ORAL | Status: DC
  Administered 2014-04-14 – 2014-04-16 (×5): 3.125 mg via ORAL
  Filled 2014-04-13 (×8): qty 1

## 2014-04-13 MED ORDER — AMIODARONE HCL 200 MG PO TABS
200.0000 mg | ORAL_TABLET | Freq: Every day | ORAL | Status: DC
  Administered 2014-04-14 – 2014-04-16 (×3): 200 mg via ORAL
  Filled 2014-04-13 (×3): qty 1

## 2014-04-13 MED ORDER — SODIUM CHLORIDE 0.9 % IJ SOLN
3.0000 mL | Freq: Two times a day (BID) | INTRAMUSCULAR | Status: DC
Start: 2014-04-14 — End: 2014-04-16
  Administered 2014-04-14 – 2014-04-16 (×5): 3 mL via INTRAVENOUS

## 2014-04-13 NOTE — ED Notes (Signed)
Pt reports to the ED via [facility] for eval of incerased SOB x the past several weeks. Pt has hx of CHF. She wears 2 L of O2 at all times via nasal cannula. Pt reports the SOB became more severe today which prompted her to call 911. Pt 88% on RA for EMS. They gave her 5 mg of Albuterol en route and pt reports sensation of decreased SOB. Pt remains tachypnic at this time and has difficulty speaking in full sentences. Denies any CP. 12 lead shows NSR with RBBB and multifocal PVCs. Lung sounds diminished with some crackles and wheezing noted. Pt A&Ox4 and skin pale, cool, and dry.

## 2014-04-13 NOTE — ED Notes (Signed)
Dr. Jenkins at bedside. 

## 2014-04-13 NOTE — H&P (Signed)
[facility] History and Physical  JAZ LANINGHAM ASN:[id number] DOB: 1918/09/06 DOA: 04/13/2014  Referring physician:  EDP PCP: Adella Hare, MD  Specialists:   Chief Complaint: Worsening SOB  HPI: [name] is a 80 y.o. female with a history of Chronic Diastolic CHF on continuous NCO2 at home who was brought to the ED due to worsening SOB and DOE x 1 week worse over the past 2 days    At home her NCO2 is 2.5 liters and it was increased to 3.5 liters but when EMS arrived at her home she was found to have O2 sats of 88% and was placed on increased O2.  She was evaluated in the ED and found to have an elevated BNP of 213400.0, and a chest X-Ray revealing Cardiomegaly and small bibasilar effusions.   Her potassium level was also found to be elevated and she was administered IV lasix x1, and Kayexalate x1 and referred for medical admission.       Review of Systems:  Constitutional: No Weight Loss, No Weight Gain, Night Sweats, Fevers, Chills, Fatigue, or Generalized Weakness HEENT: No Headaches, Difficulty Swallowing,Tooth/Dental Problems,Sore Throat,  No Sneezing, Rhinitis, Ear Ache, Nasal Congestion, or Post Nasal Drip,  Cardio-vascular:  No Chest pain, +Orthopnea, PND, Edema in lower extremities, Anasarca, Dizziness, Palpitations  Resp: +Dyspnea, +DOE, +Non-Productive Cough, No Hemoptysis,  No Wheezing.    GI: No Heartburn, Indigestion, Abdominal Pain, Nausea, Vomiting, Diarrhea, Change in Bowel Habits,  Loss of Appetite  GU: No Dysuria, Change in Color of Urine, No Urgency or Frequency.  No flank pain.  Musculoskeletal: No Joint Pain or Swelling.  No Decreased Range of Motion. No Back Pain.  Neurologic: No Syncope, No Seizures, Muscle Weakness, Paresthesia, Vision Disturbance or Loss, + HOH, No Diplopia, No Vertigo, +Difficulty Walking,  Skin: No Rash or Lesions. Psych: No Change in Mood or Affect. No Depression or Anxiety. No Memory loss. No Confusion or  Hallucinations   Past Medical History  Diagnosis Date  . Coronary artery disease   . Peripheral neuropathy   . Aortic insufficiency     mild  . Cerumen impaction   . Unspecified diastolic heart failure   . Hyperlipemia   . Mitral valve prolapse     "severe" (09/22/2012)  . Mitral regurgitation     severe  . Urosepsis   . Anemia, unspecified   . Osteoporosis   . Adenocarcinoma, breast   . Irritable bladder   . Urinary incontinence   . Hypothyroidism   . GERD (gastroesophageal reflux disease)   . H/O: whooping cough   . Hypercholesterolemia     "stopped statins ~ 2 yr ago" (09/22/2012)  . Heart murmur   . CHF (congestive heart failure)   . Shortness of breath     "basically all the time; worse w/exertion" (09/22/2012)  . Bleeding hemorrhoids ~ 2011  . Osteoarthritis     "knees, hips, hands" (09/22/2012)      Past Surgical History  Procedure Laterality Date  . Appendectomy  10/2004  . Cataract extraction w/ intraocular lens  implant, bilateral  ? 1990's  . Breast biopsy      "several on both sides; years ago; none since 1975" (09/22/2012)  . Tonsillectomy      "as a child" (09/22/2012)  . Mastectomy  ~ 1975    left breast  . Closed reduction patella fracture  ~ 1958    right      Prior to Admission medications   Medication Sig  Start Date End Date Taking? Authorizing Provider  amiodarone (PACERONE) 200 MG tablet Take 1 tablet (200 mg total) by mouth daily. 02/12/14  Yes Amy D Clegg, NP  apixaban (ELIQUIS) 2.5 MG TABS tablet Take 2.5 mg by mouth 2 (two) times daily. 10am and 6:30pm   Yes Historical Provider, MD  carvedilol (COREG) 3.125 MG tablet Take 1 tablet (3.125 mg total) by mouth 2 (two) times daily with a meal.   Yes Jolaine Artist, MD  cycloSPORINE (RESTASIS) 0.05 % ophthalmic emulsion Place 1 drop into both eyes 2 (two) times daily.    Yes Historical Provider, MD  furosemide (LASIX) 40 MG tablet Take 60 mg by mouth 2 (two) times daily. 10am and 4pm    Yes Historical Provider, MD  guaiFENesin (MUCINEX) 600 MG 12 hr tablet Take 1,200 mg by mouth 2 (two) times daily.   Yes Historical Provider, MD  Multiple Vitamin (MULTIVITAMIN WITH MINERALS) TABS tablet Take 1 tablet by mouth every evening. 4pm   Yes Historical Provider, MD  Multiple Vitamins-Minerals (PRESERVISION AREDS 2) CAPS Take 1 capsule by mouth 2 (two) times daily. 10am and 6:30pm   Yes Historical Provider, MD  nitrofurantoin (MACRODANTIN) 50 MG capsule Take 50 mg by mouth daily. Continuous course for UTI prevention   Yes Historical Provider, MD  Polyethyl Glycol-Propyl Glycol (SYSTANE) 0.4-0.3 % GEL Place 1 drop into both eyes at bedtime.   Yes Historical Provider, MD  Polyvinyl Alcohol-Povidone (FRESHKOTE OP) Place 1 drop into both eyes 4 (four) times daily.    Yes Historical Provider, MD  potassium chloride (MICRO-K) 10 MEQ CR capsule Take 40 mEq by mouth 3 (three) times daily. Open capsules and take with applesauce (10am, 4pm and 6:30pm)   Yes Historical Provider, MD    No Known Allergies   Social History:  reports that she has never smoked. She has never used smokeless tobacco. She reports that she does not drink alcohol or use illicit drugs.     Family History  Problem Relation Age of Onset  . Heart disease Mother   . Cancer Mother     breast cancer  . Stroke Father   . Heart disease Father   . Heart disease Sister   . Alzheimer's disease Sister   . Heart disease Sister   . Heart disease Brother        Physical Exam:  GEN:  Pleasant Elderly Cachectic 80 y.o. Caucasian female  examined  and in no acute distress; cooperative with exam Filed Vitals:   04/13/14 2215 04/13/14 2230 04/13/14 2245 04/13/14 2300  BP: 119/66 136/54 116/69 119/55  Pulse: 69 71 75 72  Temp:      TempSrc:      Resp: 22 24 26 25   Height:      Weight:      SpO2: 96% 92% 90% 94%   Blood pressure 119/55, pulse 72, temperature 97.4 F (36.3 C), temperature source Oral, resp. rate 25, height  4\' 7"  (1.397 m), weight 44.906 kg (99 lb), SpO2 94.00%. PSYCH: She is alert and oriented x4; does not appear anxious does not appear depressed; affect is normal HEENT: Normocephalic and Atraumatic, Mucous membranes pink; PERRLA; EOM intact; Fundi:  Benign;  No scleral icterus, Nares: Patent, Oropharynx: Clear, Fair Dentition, Neck:  FROM, no cervical lymphadenopathy nor thyromegaly or carotid bruit; no JVD; Breasts:: Not examined CHEST WALL: No tenderness CHEST: Normal respiration,  Decreased Breath Sounds,  BiBasilar Rales Present,   HEART: Regular rate and rhythm; no murmurs  rubs or gallops BACK: + Kyphosis or scoliosis; no CVA tenderness ABDOMEN: Positive Bowel Sounds, Scaphoid, soft non-tender; no masses, no organomegaly. Rectal Exam: Not done EXTREMITIES: No cyanosis, clubbing or edema; no ulcerations. Genitalia: not examined PULSES: 2+ and symmetric SKIN: Normal hydration no rash or ulceration CNS:  Alert X Oriented x 4,  No Focal Deficits Vascular: pulses palpable throughout    Labs on Admission:  Basic Metabolic Panel:  Recent Labs Lab 04/13/14 2106 04/13/14 2204 04/13/14 2233  NA 137 140 141  K 6.2* 6.0* 5.7*  CL 101 107 107  CO2 21  --   --   GLUCOSE 191* 186* 151*  BUN 34* 37* 33*  CREATININE 1.25* 1.40* 1.30*  CALCIUM 9.7  --   --    Liver Function Tests: No results found for this basename: AST, ALT, ALKPHOS, BILITOT, PROT, ALBUMIN,  in the last 168 hours No results found for this basename: LIPASE, AMYLASE,  in the last 168 hours No results found for this basename: AMMONIA,  in the last 168 hours CBC:  Recent Labs Lab 04/13/14 2106 04/13/14 2204 04/13/14 2233  WBC 9.0  --   --   HGB 10.3* 12.2 11.9*  HCT 33.1* 36.0 35.0*  MCV 95.7  --   --   PLT 255  --   --    Cardiac Enzymes: No results found for this basename: CKTOTAL, CKMB, CKMBINDEX, TROPONINI,  in the last 168 hours  BNP (last 3 results)  Recent Labs  12/24/13 2000 12/26/13 2133  04/13/14 2106  PROBNP 11373.0* 12752.0* 21340.0*   CBG: No results found for this basename: GLUCAP,  in the last 168 hours  Radiological Exams on Admission: Dg Chest Portable 1 View  04/13/2014   CLINICAL DATA:  Shortness of breath.  EXAM: PORTABLE CHEST - 1 VIEW  COMPARISON:  Prior radiograph from 12/26/2013.  FINDINGS: Cardiomegaly is stable as compared to prior exam. Tortuosity of the intrathoracic aorta noted. Calcifications present within the aortic arch. Surgical clips overlie the left axilla.  Lungs are hypoinflated with chronic coarsening of the interstitial markings, similar to prior. Patchy opacity present within the medial right lung base, which may reflect atelectasis and/ bronchovascular crowding. Possible infectious infiltrate could be considered in the correct clinical setting. Blunting of the bilateral costophrenic angles is suggestive of tiny bilateral pleural effusions. No overt pulmonary edema. There is no pneumothorax.  Diffuse osteopenia noted.  No acute osseous abnormality.  IMPRESSION: 1. Shallow lung inflation with patchy opacity within the medial right lung base. Findings may reflect atelectasis and/ bronchovascular crowding related to hypoinflation. Possible infectious infiltrate could be considered in the correct clinical setting. 2. Probable small bilateral pleural effusions. 3. Stable cardiomegaly without overt pulmonary edema.   Electronically Signed   By: Jeannine Boga M.D.   On: 04/13/2014 21:29    EKG: Independently reviewed. Atrial fibrillation, RBBB,     Assessment/Plan:   80 y.o. female with Acute on Chronic Diastolic CHF  Active Problems:   Hyperkalemia   Acute on chronic diastolic congestive heart failure   Atrial fibrillation   Chronic kidney disease (CKD), stage IV (severe)   Acute on chronic respiratory failure   HYPERLIPIDEMIA   HYPOTHYROIDISM, HX OF   Valvular disease   Anemia     1.   Acute on Chronic Diastolic CHF-   Admitted to [place]  Bed,  Diurese with IV Lasix.     2.   Acute Respiratory Failure-   [place] O2 PRN.  On [place] O2 continuously.     3.   Hyperkalemia-   K+ on hold and given Kayexalate x1,  And IV Lasix, Monitor K+.     4.   Atrial Fibrillation- on Eliquis,  Carvedilol, and Amiodarone.     5.   CKD Stage IV-   Monitor BUN/Cr.     6.   Hypothyroid-  Check TSH level.    7.   Valvular Disease-   Chronic.     8.   Anemia-  Send Anemia panel.     9.  Hyperlipidemia-  Hx.  Continue Heart Healthy Diet.      Code Status:  DO NOT RESUSCITATE (DNR)  Family Communication:  Family at Bedside   Disposition Plan:    Inpatient  Time spent:  [facility] Pager [phone]  If 7PM-7AM, please contact night-coverage [email or web address] Password [email or web address] 04/13/2014, 11:11 PM

## 2014-04-13 NOTE — ED Provider Notes (Signed)
CSN: [id number]     Arrival date & time 04/13/14  2036 History   First MD Initiated Contact with Patient 04/13/14 2036     Chief Complaint  Patient presents with  . Shortness of Breath     (Consider location/radiation/quality/duration/timing/severity/associated sxs/prior Treatment) The history is provided by the patient.  [name] is a 80 y.o. female hx of CAD, HL, mitral valve prolapse, CHF on 2L NS here with SOB. SOB chronically but worsening over the last week. She has been on 3L NS for the last 2 days and still short of breath. She also can't walk as much due to shortness of breath. Today, she has a coughing fit and family called EMS. EMS noted hypoxic to 88% on RA. She was given albuterol 5 mg en route. Denies fever or hx of COPD.   Level V caveat- condition of patient   Past Medical History  Diagnosis Date  . Coronary artery disease   . Peripheral neuropathy   . Aortic insufficiency     mild  . Cerumen impaction   . Unspecified diastolic heart failure   . Hyperlipemia   . Mitral valve prolapse     "severe" (09/22/2012)  . Mitral regurgitation     severe  . Urosepsis   . Anemia, unspecified   . Osteoporosis   . Adenocarcinoma, breast   . Irritable bladder   . Urinary incontinence   . Hypothyroidism   . GERD (gastroesophageal reflux disease)   . H/O: whooping cough   . Hypercholesterolemia     "stopped statins ~ 2 yr ago" (09/22/2012)  . Heart murmur   . CHF (congestive heart failure)   . Shortness of breath     "basically all the time; worse w/exertion" (09/22/2012)  . Bleeding hemorrhoids ~ 2011  . Osteoarthritis     "knees, hips, hands" (09/22/2012)   Past Surgical History  Procedure Laterality Date  . Appendectomy  10/2004  . Cataract extraction w/ intraocular lens  implant, bilateral  ? 1990's  . Breast biopsy      "several on both sides; years ago; none since 1975" (09/22/2012)  . Tonsillectomy      "as a child" (09/22/2012)  . Mastectomy  ~  1975    left breast  . Closed reduction patella fracture  ~ 1958    right    Family History  Problem Relation Age of Onset  . Heart disease Mother   . Cancer Mother     breast cancer  . Stroke Father   . Heart disease Father   . Heart disease Sister   . Alzheimer's disease Sister   . Heart disease Sister   . Heart disease Brother    History  Substance Use Topics  . Smoking status: Never Smoker   . Smokeless tobacco: Never Used  . Alcohol Use: No   OB History   Grav Para Term Preterm Abortions TAB SAB Ect Mult Living                 Review of Systems  Respiratory: Positive for shortness of breath.   All other systems reviewed and are negative.     Allergies  Review of patient's allergies indicates no known allergies.  Home Medications   Prior to Admission medications   Medication Sig Start Date End Date Taking? Authorizing Provider  amiodarone (PACERONE) 200 MG tablet Take 1 tablet (200 mg total) by mouth daily. 02/12/14  Yes Amy Estrella Deeds, NP  apixaban (ELIQUIS) 2.5  MG TABS tablet Take 2.5 mg by mouth 2 (two) times daily. 10am and 6:30pm   Yes Historical Provider, MD  carvedilol (COREG) 3.125 MG tablet Take 1 tablet (3.125 mg total) by mouth 2 (two) times daily with a meal.   Yes Jolaine Artist, MD  cycloSPORINE (RESTASIS) 0.05 % ophthalmic emulsion Place 1 drop into both eyes 2 (two) times daily.    Yes Historical Provider, MD  furosemide (LASIX) 40 MG tablet Take 60 mg by mouth 2 (two) times daily. 10am and 4pm   Yes Historical Provider, MD  guaiFENesin (MUCINEX) 600 MG 12 hr tablet Take 1,200 mg by mouth 2 (two) times daily.   Yes Historical Provider, MD  Multiple Vitamin (MULTIVITAMIN WITH MINERALS) TABS tablet Take 1 tablet by mouth every evening. 4pm   Yes Historical Provider, MD  Multiple Vitamins-Minerals (PRESERVISION AREDS 2) CAPS Take 1 capsule by mouth 2 (two) times daily. 10am and 6:30pm   Yes Historical Provider, MD  nitrofurantoin (MACRODANTIN) 50 MG  capsule Take 50 mg by mouth daily. Continuous course for UTI prevention   Yes Historical Provider, MD  Polyethyl Glycol-Propyl Glycol (SYSTANE) 0.4-0.3 % GEL Place 1 drop into both eyes at bedtime.   Yes Historical Provider, MD  Polyvinyl Alcohol-Povidone (FRESHKOTE OP) Place 1 drop into both eyes 4 (four) times daily.    Yes Historical Provider, MD  potassium chloride (MICRO-K) 10 MEQ CR capsule Take 40 mEq by mouth 3 (three) times daily. Open capsules and take with applesauce (10am, 4pm and 6:30pm)   Yes Historical Provider, MD   BP 132/88  Pulse 69  Temp(Src) 97.4 F (36.3 C) (Oral)  Resp 18  Ht 4\' 7"  (1.397 m)  Wt 99 lb (44.906 kg)  BMI 23.01 kg/m2  SpO2 93% Physical Exam  Nursing note and vitals reviewed. Constitutional: She is oriented to person, place, and time.  Chronically ill  HENT:  Head: Normocephalic.  Mouth/Throat: Oropharynx is clear and moist.  Eyes: Conjunctivae are normal. Pupils are equal, round, and reactive to light.  Neck: Normal range of motion. Neck supple.  Cardiovascular: Normal rate, regular rhythm and normal heart sounds.   Pulmonary/Chest:  Crackles bilateral bases   Abdominal: Soft. Bowel sounds are normal. She exhibits no distension. There is no tenderness. There is no rebound and no guarding.  Musculoskeletal:  1+ edema bilaterally   Neurological: She is alert and oriented to person, place, and time. No cranial nerve deficit. Coordination normal.  Skin: Skin is warm and dry.  Psychiatric: She has a normal mood and affect. Her behavior is normal. Judgment and thought content normal.    ED Course  Procedures (including critical care time) Labs Review Labs Reviewed  PRO B NATRIURETIC PEPTIDE - Abnormal; Notable for the following:    Pro B Natriuretic peptide (BNP) 21340.0 (*)    All other components within normal limits  BASIC METABOLIC PANEL - Abnormal; Notable for the following:    Potassium 6.2 (*)    Glucose, Bld 191 (*)    BUN 34 (*)     Creatinine, Ser 1.25 (*)    GFR calc non Af Amer 36 (*)    GFR calc Af Amer 41 (*)    All other components within normal limits  CBC - Abnormal; Notable for the following:    RBC 3.46 (*)    Hemoglobin 10.3 (*)    HCT 33.1 (*)    RDW 16.6 (*)    All other components within normal limits  I-STAT  CHEM 8, ED - Abnormal; Notable for the following:    Potassium 6.0 (*)    BUN 37 (*)    Creatinine, Ser 1.40 (*)    Glucose, Bld 186 (*)    All other components within normal limits  I-STAT CHEM 8, ED - Abnormal; Notable for the following:    Potassium 5.7 (*)    BUN 33 (*)    Creatinine, Ser 1.30 (*)    Glucose, Bld 151 (*)    Hemoglobin 11.9 (*)    HCT 35.0 (*)    All other components within normal limits  I-STAT TROPOININ, ED  I-STAT CG4 LACTIC ACID, ED    Imaging Review Dg Chest Portable 1 View  04/13/2014   CLINICAL DATA:  Shortness of breath.  EXAM: PORTABLE CHEST - 1 VIEW  COMPARISON:  Prior radiograph from 12/26/2013.  FINDINGS: Cardiomegaly is stable as compared to prior exam. Tortuosity of the intrathoracic aorta noted. Calcifications present within the aortic arch. Surgical clips overlie the left axilla.  Lungs are hypoinflated with chronic coarsening of the interstitial markings, similar to prior. Patchy opacity present within the medial right lung base, which may reflect atelectasis and/ bronchovascular crowding. Possible infectious infiltrate could be considered in the correct clinical setting. Blunting of the bilateral costophrenic angles is suggestive of tiny bilateral pleural effusions. No overt pulmonary edema. There is no pneumothorax.  Diffuse osteopenia noted.  No acute osseous abnormality.  IMPRESSION: 1. Shallow lung inflation with patchy opacity within the medial right lung base. Findings may reflect atelectasis and/ bronchovascular crowding related to hypoinflation. Possible infectious infiltrate could be considered in the correct clinical setting. 2. Probable small  bilateral pleural effusions. 3. Stable cardiomegaly without overt pulmonary edema.   Electronically Signed   By: Jeannine Boga M.D.   On: 04/13/2014 21:29     EKG Interpretation None      MDM   Final diagnoses:  None    [name] is a 80 y.o. female here with SOB. Likely CHF exacerbation. Will get CXR, labs, BNP. Will likely need admission.   10:38 PM BNP elevated. Given lasix. CXR showed cardiomegaly and atelectasis. WBC nl, lactate nl, I doubt pneumonia. Will hold abx. K 6.2, repeat twice and was 5.7. Given bicarb and kayexelate. Will admit to tele.    Wandra Arthurs, MD 04/13/14 2239

## 2014-04-13 NOTE — Progress Notes (Signed)
Pt transferred from ED via stretcher to room 3E03. Pt oriented to room and call bell system. Family at the bedside. VSS. Pt complains of no pain or discomfort at this time. Will continue to monitor to end of shift.

## 2014-04-14 ENCOUNTER — Encounter (HOSPITAL_COMMUNITY): Payer: Self-pay | Admitting: Surgery

## 2014-04-14 DIAGNOSIS — E43 Unspecified severe protein-calorie malnutrition: Secondary | ICD-10-CM

## 2014-04-14 LAB — IRON AND TIBC
IRON: 32 ug/dL — AB (ref 42–135)
Saturation Ratios: 12 % — ABNORMAL LOW (ref 20–55)
TIBC: 271 ug/dL (ref 250–470)
UIBC: 239 ug/dL (ref 125–400)

## 2014-04-14 LAB — BASIC METABOLIC PANEL
BUN: 33 mg/dL — ABNORMAL HIGH (ref 6–23)
CO2: 27 mEq/L (ref 19–32)
Calcium: 9.7 mg/dL (ref 8.4–10.5)
Chloride: 104 mEq/L (ref 96–112)
Creatinine, Ser: 1.32 mg/dL — ABNORMAL HIGH (ref 0.50–1.10)
GFR calc Af Amer: 39 mL/min — ABNORMAL LOW (ref >90)
GFR, EST NON AFRICAN AMERICAN: 33 mL/min — AB (ref >90)
Glucose, Bld: 132 mg/dL — ABNORMAL HIGH (ref 70–99)
POTASSIUM: 4.4 meq/L (ref 3.7–5.3)
Sodium: 145 mEq/L (ref 137–147)

## 2014-04-14 LAB — T4, FREE: Free T4: 0.95 ng/dL (ref 0.80–1.80)

## 2014-04-14 LAB — RETICULOCYTES
RBC.: 3.58 MIL/uL — ABNORMAL LOW (ref 3.87–5.11)
RETIC COUNT ABSOLUTE: 89.5 10*3/uL (ref 19.0–186.0)
RETIC CT PCT: 2.5 % (ref 0.4–3.1)

## 2014-04-14 LAB — TSH
TSH: 10.68 u[IU]/mL — ABNORMAL HIGH (ref 0.350–4.500)
TSH: 11.69 u[IU]/mL — AB (ref 0.350–4.500)

## 2014-04-14 LAB — VITAMIN B12: VITAMIN B 12: 1061 pg/mL — AB (ref 211–911)

## 2014-04-14 LAB — FERRITIN: Ferritin: 153 ng/mL (ref 10–291)

## 2014-04-14 LAB — FOLATE: Folate: >20 ng/mL

## 2014-04-14 MED ORDER — POLYVINYL ALCOHOL 1.4 % OP SOLN
1.0000 [drp] | Freq: Every day | OPHTHALMIC | Status: DC
  Administered 2014-04-14 – 2014-04-15 (×3): 1 [drp] via OPHTHALMIC
  Filled 2014-04-14: qty 15

## 2014-04-14 MED ORDER — FUROSEMIDE 10 MG/ML IJ SOLN: 40.0000 mg | Freq: Three times a day (TID) | INTRAMUSCULAR | Status: DC

## 2014-04-14 MED ORDER — PROSIGHT PO TABS
1.0000 | ORAL_TABLET | Freq: Two times a day (BID) | ORAL | Status: DC
  Administered 2014-04-14 – 2014-04-16 (×5): 1 via ORAL
  Filled 2014-04-14 (×6): qty 1

## 2014-04-14 MED ORDER — FUROSEMIDE 10 MG/ML IJ SOLN
40.0000 mg | Freq: Two times a day (BID) | INTRAMUSCULAR | Status: DC
  Administered 2014-04-14 (×2): 40 mg via INTRAVENOUS
  Filled 2014-04-14 (×2): qty 4

## 2014-04-14 MED ORDER — NITROFURANTOIN MACROCRYSTAL 50 MG PO CAPS
50.0000 mg | ORAL_CAPSULE | Freq: Every day | ORAL | Status: DC
  Filled 2014-04-14: qty 1

## 2014-04-14 MED ORDER — ARTIFICIAL TEARS OP OINT
TOPICAL_OINTMENT | Freq: Every day | OPHTHALMIC | Status: DC
  Administered 2014-04-14 – 2014-04-15 (×2): via OPHTHALMIC
  Filled 2014-04-14 (×2): qty 3.5

## 2014-04-14 MED ORDER — LEVOTHYROXINE SODIUM 25 MCG PO TABS: 25.0000 ug | ORAL_TABLET | Freq: Every day | ORAL | Status: DC

## 2014-04-14 MED ORDER — POLYETHYL GLYCOL-PROPYL GLYCOL 0.4-0.3 % OP GEL: 1.0000 [drp] | Freq: Every day | OPHTHALMIC | Status: DC

## 2014-04-14 MED ORDER — BIOTENE DRY MOUTH MT LIQD
15.0000 mL | OROMUCOSAL | Status: DC | PRN
Start: 2014-04-14 — End: 2014-04-16

## 2014-04-14 NOTE — Consult Note (Signed)
Patient [id number] MICKI CASSEL      DOB: 05-22-1918      HJS:[id number]   Summary of Goals of care; full note to follow :  Met with Son Dominica Severin and Daughter [name] and subsequently the patient  Family has been caring for her at [facility] home.  She has been declining over the last two months.  They see her not bouncing back.  We talked about the options for continued rescue form chronic illness vs considering comfort care with hospice involvement.  Daughter having hard time thinking about loosing her mom.  She has cared for her for the past 9 yrs.  They would like to continue to try to optimize her symptom management and may opt to go home with home health this time with consideration for hospice at a later time.  The will let the primary team know.  Total time : 200-250  Lynetta Tomczak L. Lovena Le, MD MBA The Palliative Medicine Team at [facility] Phone: [phone] Pager: [phone]

## 2014-04-14 NOTE — Progress Notes (Signed)
Patient alert and oriented x4 throughout shift, vital signs stable.  Requested parameters for amiodarone administration, per MD, ok to give to patient.  Venturi mask applied this AM at RT recommendation as patient desats on [place], due to mouth breathing.  Continuous pulse ox applied.  Patient's son and daughter at bedside throughout shift.  Patient and family deny any questions or concerns at this time.  Will continue to monitor.

## 2014-04-14 NOTE — Progress Notes (Signed)
[facility] PROGRESS NOTE  Filed Weights   04/13/14 2051 04/13/14 2350 04/14/14 0439  Weight: 44.906 kg (99 lb) 45.133 kg (99 lb 8 oz) 42.774 kg (94 lb 4.8 oz)        Intake/Output Summary (Last 24 hours) at 04/14/14 0854 Last data filed at 04/14/14 0445  Gross per 24 hour  Intake    123 ml  Output    700 ml  Net   -577 ml     Assessment/Plan: Acute on chronic respiratory failure Acute on chronic diastolic congestive heart failure - On IV lasix, coreg and amiodarone. - pt seems to be below her lowest weight, Cr is at baseline. BNP 21000 ( above previous 12000) - -JVD and HJ reflex. She relates she feels full all the time and it is not hungry. - has had decrease appetite for the last month. - family will like to meet with PMT  Hyperkalemia: - resolved with kayexalate and lasix. - ? Due to supplement. - recheck B-met.  Atrial Fibrillation: - On Eliquis, Carvedilol, and Amiodarone.   Chronic kidney disease (CKD), stage IV (severe) - Cr. cont to be at baseline.  HYPOTHYROIDISM, HX OF: - TSh has been high for some time. - rechec TSH and free T4. She is on amiodarone.    Code Status: DO NOT RESUSCITATE (DNR)  Family Communication: Family at Bedside  Disposition Plan: Inpatient    Consultants:  none  Procedures: ECHO: noen  Antibiotics:  None  HPI/Subjective: Relates breathing is about the same.  Objective: Filed Vitals:   04/14/14 0004 04/14/14 0437 04/14/14 0439 04/14/14 0535  BP:  100/57  120/50  Pulse:  69  66  Temp: 97.5 F (36.4 C) 97.6 F (36.4 C)    TempSrc:  Oral    Resp:  19    Height:      Weight:   42.774 kg (94 lb 4.8 oz)   SpO2:  88%       Exam:  General: Alert, awake, oriented x3, in no acute distress.  HEENT: No bruits, no goiter. +JVD Heart: Regular rate and rhythm, without murmurs, rubs, gallops.  Lungs: Good air movement, crackles at bases. Abdomen: Soft, diffuse tenderness, nondistended, positive bowel sounds.      Data Reviewed: Basic Metabolic Panel:  Recent Labs Lab 04/13/14 2106 04/13/14 2204 04/13/14 2233 04/14/14 0040  NA 137 140 141 145  K 6.2* 6.0* 5.7* 4.4  CL 101 107 107 104  CO2 21  --   --  27  GLUCOSE 191* 186* 151* 132*  BUN 34* 37* 33* 33*  CREATININE 1.25* 1.40* 1.30* 1.32*  CALCIUM 9.7  --   --  9.7   Liver Function Tests: No results found for this basename: AST, ALT, ALKPHOS, BILITOT, PROT, ALBUMIN,  in the last 168 hours No results found for this basename: LIPASE, AMYLASE,  in the last 168 hours No results found for this basename: AMMONIA,  in the last 168 hours CBC:  Recent Labs Lab 04/13/14 2106 04/13/14 2204 04/13/14 2233  WBC 9.0  --   --   HGB 10.3* 12.2 11.9*  HCT 33.1* 36.0 35.0*  MCV 95.7  --   --   PLT 255  --   --    Cardiac Enzymes: No results found for this basename: CKTOTAL, CKMB, CKMBINDEX, TROPONINI,  in the last 168 hours BNP (last 3 results)  Recent Labs  12/24/13 2000 12/26/13 2133 04/13/14 2106  PROBNP 11373.0* 12752.0* 21340.0*   CBG:  No results found for this basename: GLUCAP,  in the last 168 hours  No results found for this or any previous visit (from the past 240 hour(s)).   Studies: Dg Chest Portable 1 View  04/13/2014   CLINICAL DATA:  Shortness of breath.  EXAM: PORTABLE CHEST - 1 VIEW  COMPARISON:  Prior radiograph from 12/26/2013.  FINDINGS: Cardiomegaly is stable as compared to prior exam. Tortuosity of the intrathoracic aorta noted. Calcifications present within the aortic arch. Surgical clips overlie the left axilla.  Lungs are hypoinflated with chronic coarsening of the interstitial markings, similar to prior. Patchy opacity present within the medial right lung base, which may reflect atelectasis and/ bronchovascular crowding. Possible infectious infiltrate could be considered in the correct clinical setting. Blunting of the bilateral costophrenic angles is suggestive of tiny bilateral pleural effusions. No overt  pulmonary edema. There is no pneumothorax.  Diffuse osteopenia noted.  No acute osseous abnormality.  IMPRESSION: 1. Shallow lung inflation with patchy opacity within the medial right lung base. Findings may reflect atelectasis and/ bronchovascular crowding related to hypoinflation. Possible infectious infiltrate could be considered in the correct clinical setting. 2. Probable small bilateral pleural effusions. 3. Stable cardiomegaly without overt pulmonary edema.   Electronically Signed   By: Jeannine Boga M.D.   On: 04/13/2014 21:29    Scheduled Meds: . amiodarone  200 mg Oral Daily  . apixaban  2.5 mg Oral BID  . artificial tears   Both Eyes QHS  . carvedilol  3.125 mg Oral BID WC  . cycloSPORINE  1 drop Both Eyes BID  . furosemide  40 mg Intravenous Q12H  . guaiFENesin  1,200 mg Oral BID  . multivitamin  1 tablet Oral BID  . multivitamin with minerals  1 tablet Oral QPM  . nitrofurantoin  50 mg Oral Daily  . polyvinyl alcohol  1 drop Both Eyes QHS  . sodium chloride  3 mL Intravenous Q12H   Continuous Infusions:    Charlynne Cousins  [facility] Pager [phone] If 8PM-8AM, please contact night-coverage at [email or web address], password [facility] 04/14/2014, 8:54 AM  LOS: 1 day     **Disclaimer: This note may have been dictated with voice recognition software. Similar sounding words can inadvertently be transcribed and this note may contain transcription errors which may not have been corrected upon publication of note.**

## 2014-04-14 NOTE — Progress Notes (Signed)
Notified by CMT that patient's oxygen saturations had dropped to the high 70's and stated that he had tried to call nurse but no answer. CMT also stated that about 7 alerts were sent to the phone. Nurse phone was outside of the patient's room but nurse was in a different patient's room during the time of alerts and therefore did not receive call or alerts. Once nurse left out of the other patients room, was notified by another staff nurse of continuous pulse oximetry was not working properly through the heart monitor. Connected patient to a dinomap machine with pulse oximeter and compared readings. The reading on the dinomap was more accurate than on the heart monitor for it would show patient's O2 sats at 97 while the heart monitor read 89/90% Patient was not in distress, skin color was appropriate to ethnicity- pink. Notified CMT that a new pulse ox box would be ordered but to continue monitoring until the pulse ox box arrives. Currently on the dinomap oxygen saturations are 98% on 3 1/2 liters of oxygen. Will continue to monitor patient to end of shift.

## 2014-04-14 NOTE — Discharge Instructions (Signed)
Information on my medicine - ELIQUIS (apixaban)  This medication education was reviewed with me or my healthcare representative as part of my discharge preparation.  The pharmacist that spoke with me during my hospital stay was:  Angelica Chessman, [facility]  Why was Eliquis prescribed for you? Eliquis was prescribed for you to reduce the risk of a blood clot forming that can cause a stroke if you have a medical condition called atrial fibrillation (a type of irregular heartbeat).  What do You need to know about Eliquis ? Take your Eliquis TWICE DAILY - one tablet in the morning and one tablet in the evening with or without food. If you have difficulty swallowing the tablet whole please discuss with your pharmacist how to take the medication safely.  Take Eliquis exactly as prescribed by your doctor and DO NOT stop taking Eliquis without talking to the doctor who prescribed the medication.  Stopping may increase your risk of developing a stroke.  Refill your prescription before you run out.  After discharge, you should have regular check-up appointments with your healthcare provider that is prescribing your Eliquis.  In the future your dose may need to be changed if your kidney function or weight changes by a significant amount or as you get older.  What do you do if you miss a dose? If you miss a dose, take it as soon as you remember on the same day and resume taking twice daily.  Do not take more than one dose of ELIQUIS at the same time to make up a missed dose.  Important Safety Information A possible side effect of Eliquis is bleeding. You should call your healthcare provider right away if you experience any of the following:   Bleeding from an injury or your nose that does not stop.   Unusual colored urine (red or dark brown) or unusual colored stools (red or black).   Unusual bruising for unknown reasons.   A serious fall or if you hit your head (even if there is no bleeding).  Some  medicines may interact with Eliquis and might increase your risk of bleeding or clotting while on Eliquis. To help avoid this, consult your healthcare provider or pharmacist prior to using any new prescription or non-prescription medications, including herbals, vitamins, non-steroidal anti-inflammatory drugs (NSAIDs) and supplements.  This website has more information on Eliquis (apixaban): [email or web address].

## 2014-04-15 DIAGNOSIS — E43 Unspecified severe protein-calorie malnutrition: Secondary | ICD-10-CM | POA: Insufficient documentation

## 2014-04-15 LAB — BASIC METABOLIC PANEL
BUN: 24 mg/dL — ABNORMAL HIGH (ref 6–23)
CALCIUM: 8.5 mg/dL (ref 8.4–10.5)
CO2: 31 meq/L (ref 19–32)
Chloride: 99 mEq/L (ref 96–112)
Creatinine, Ser: 1.12 mg/dL — ABNORMAL HIGH (ref 0.50–1.10)
GFR calc Af Amer: 47 mL/min — ABNORMAL LOW (ref >90)
GFR calc non Af Amer: 41 mL/min — ABNORMAL LOW (ref >90)
GLUCOSE: 70 mg/dL (ref 70–99)
POTASSIUM: 2.6 meq/L — AB (ref 3.7–5.3)
Sodium: 143 mEq/L (ref 137–147)

## 2014-04-15 MED ORDER — CHLORHEXIDINE GLUCONATE 0.12 % MT SOLN
15.0000 mL | Freq: Two times a day (BID) | OROMUCOSAL | Status: DC
  Administered 2014-04-15 – 2014-04-16 (×2): 15 mL via OROMUCOSAL
  Filled 2014-04-15 (×4): qty 15

## 2014-04-15 MED ORDER — POTASSIUM CHLORIDE 10 MEQ/100ML IV SOLN
10.0000 meq | INTRAVENOUS | Status: DC
  Administered 2014-04-15: 10 meq via INTRAVENOUS
  Filled 2014-04-15 (×3): qty 100

## 2014-04-15 MED ORDER — BIOTENE DRY MOUTH MT LIQD: 15.0000 mL | Freq: Two times a day (BID) | OROMUCOSAL | Status: DC

## 2014-04-15 MED ORDER — FUROSEMIDE 40 MG PO TABS
40.0000 mg | ORAL_TABLET | Freq: Two times a day (BID) | ORAL | Status: DC
Start: 2014-04-15 — End: 2014-04-16
  Administered 2014-04-15 – 2014-04-16 (×3): 40 mg via ORAL
  Filled 2014-04-15 (×5): qty 1

## 2014-04-15 MED ORDER — POTASSIUM CHLORIDE CRYS ER 20 MEQ PO TBCR
40.0000 meq | EXTENDED_RELEASE_TABLET | Freq: Two times a day (BID) | ORAL | Status: AC
  Administered 2014-04-15 (×2): 40 meq via ORAL
  Filled 2014-04-15 (×2): qty 2

## 2014-04-15 MED ORDER — IPRATROPIUM-ALBUTEROL 0.5-2.5 (3) MG/3ML IN SOLN
3.0000 mL | RESPIRATORY_TRACT | Status: DC | PRN
  Administered 2014-04-16: 3 mL via RESPIRATORY_TRACT
  Filled 2014-04-15 (×2): qty 3

## 2014-04-15 MED ORDER — IPRATROPIUM-ALBUTEROL 0.5-2.5 (3) MG/3ML IN SOLN: 3.0000 mL | RESPIRATORY_TRACT | Status: DC

## 2014-04-15 MED ORDER — BOOST / RESOURCE BREEZE PO LIQD
1.0000 | Freq: Three times a day (TID) | ORAL | Status: DC
  Administered 2014-04-15 – 2014-04-16 (×3): 1 via ORAL

## 2014-04-15 NOTE — Progress Notes (Signed)
Pt restless, c/o SOB, repositioned in bed, Oxygen level 93 to 94% on venturi mask. [facility] notified, new orders given

## 2014-04-15 NOTE — Progress Notes (Signed)
RT tried pt on 6L [place]. Pt desated to 16. RT placed pt back on 45% VM-sats 94, rr 16.

## 2014-04-15 NOTE — Progress Notes (Signed)
CRITICAL VALUE ALERT  Critical value received:  Potassium 2.6  Date of notification:  04/15/2014  Time of notification:  7543  Critical value read back:yes  Nurse who received alert:  Levander Campion, RN  MD notified (1st page):  L. Frederik Pear, NP  Time of first page:  0539  MD notified (2nd page):  Time of second page:  Responding MD:  Vivien Presto, NP  Time MD responded:  [phone]

## 2014-04-15 NOTE — Evaluation (Signed)
Physical Therapy Evaluation Patient Details Name: [name] MRN: [id number] DOB: 24-Dec-1917 Today's Date: 04/15/2014   History of Present Illness  [name] is a 80 y.o. female with a history of Chronic Diastolic CHF on continuous NCO2 at home who was brought to the ED due to worsening SOB and DOE x 1 week worse over the past 2 days    At home her NCO2 is 2.5 liters and it was increased to 3.5 liters but when EMS arrived at her home she was found to have O2 sats of 88% and was placed on increased O2.  She was evaluated in the ED and found to have an elevated BNP of 213400.0, and a chest X-Ray revealing Cardiomegaly and small bibasilar effusions.   Her potassium level was also found to be elevated and she was administered IV lasix x1, and Kayexalate x1 and referred for medical admission  Clinical Impression  Pt presenting with generalized deconditioning, + SOB with any activity, and impaired balance. Pt with good home set up and extremely supportive family. Pt SpO2 >94% on 4LO2 via [place] during session despite noticeable SOB. Pt to benefit from HHPT to address mentioned deficits. Son agrees.    Follow Up Recommendations Home health PT;Supervision/Assistance - 24 hour    Equipment Recommendations  None recommended by PT    Recommendations for Other Services       Precautions / Restrictions Precautions Precautions: Fall Restrictions Weight Bearing Restrictions: No      Mobility  Bed Mobility Overal bed mobility: Needs Assistance Bed Mobility: Supine to Sit     Supine to sit: Mod assist     General bed mobility comments: assist for trunk elevation and to bring LEs off EOB  Transfers Overall transfer level: Needs assistance Equipment used: Rolling walker (2 wheeled) Transfers: Sit to/from Stand Sit to Stand: Min assist         General transfer comment: v/c's for hand placement, assist to initiate anterior weight shift  Ambulation/Gait Ambulation/Gait  assistance: Min assist Ambulation Distance (Feet):  (5 steps to chair) Assistive device: Rolling walker (2 wheeled) Gait Pattern/deviations: Step-to pattern;Decreased stride length;Shuffle   Gait velocity interpretation: Below normal speed for age/gender General Gait Details: pt with + SOB, SpO2 >94% on 4LO2 via [place]  Stairs            Wheelchair Mobility    Modified Rankin (Stroke Patients Only)       Balance Overall balance assessment: Needs assistance Sitting-balance support: Feet supported;Bilateral upper extremity supported Sitting balance-Leahy Scale: Fair Sitting balance - Comments: pt sat EOB x5 min   Standing balance support: Bilateral upper extremity supported Standing balance-Leahy Scale: Poor Standing balance comment: requires assist of RW                             Pertinent Vitals/Pain SpO2 >94% on 4LO2 via [place] expects to be discharged to:: Private residence Living Arrangements: Children Available Help at Discharge: Family;Available 24 hours/day Type of Home: House Home Access: Stairs to enter Entrance Stairs-Rails: Psychiatric nurse of Steps: 3 Home Layout: Two level;Able to live on main level with bedroom/bathroom Home Equipment: Gilford Rile - 2 wheels;Shower seat;Grab bars - toilet;Grab bars - tub/shower      Prior Function Level of Independence: Needs assistance   Gait / Transfers Assistance Needed: uses RW, limited to amb <50 feet due to SOB  ADL's / Homemaking Assistance Needed:  assist for bathing and dressing  Comments: recently requires assist for transfers     Hand Dominance   Dominant Hand: Right    Extremity/Trunk Assessment   Upper Extremity Assessment: Generalized weakness           Lower Extremity Assessment: Generalized weakness      Cervical / Trunk Assessment: Kyphotic  Communication   Communication: HOH  Cognition Arousal/Alertness: Awake/alert Behavior  During Therapy: WFL for tasks assessed/performed Overall Cognitive Status: Within Functional Limits for tasks assessed                      General Comments      Exercises        Assessment/Plan    PT Assessment Patient needs continued PT services  PT Diagnosis Difficulty walking;Generalized weakness   PT Problem List Decreased strength;Decreased activity tolerance;Decreased balance;Decreased mobility  PT Treatment Interventions DME instruction;Gait training;Stair training;Functional mobility training;Therapeutic activities;Therapeutic exercise   PT Goals (Current goals can be found in the Care Plan section) Acute Rehab PT Goals Patient Stated Goal: didn't report PT Goal Formulation: With patient Time For Goal Achievement: 04/22/14 Potential to Achieve Goals: Good    Frequency Min 3X/week   Barriers to discharge        Co-evaluation               End of Session Equipment Utilized During Treatment: Gait belt Activity Tolerance: Patient limited by fatigue Patient left: in chair;with call bell/phone within reach;with family/visitor present Nurse Communication: Mobility status         Time: 3825-0539 PT Time Calculation (min): 31 min   Charges:   PT Evaluation $Initial PT Evaluation Tier I: 1 Procedure PT Treatments $Therapeutic Activity: 8-22 mins   PT G CodesKingsley Callander 04/15/2014, 4:46 PM  Kittie Plater, PT, DPT Pager #: [phone] Office #: [phone]

## 2014-04-15 NOTE — Progress Notes (Signed)
INITIAL NUTRITION ASSESSMENT  DOCUMENTATION CODES Per approved criteria  -Severe malnutrition in the context of chronic illness   Pt meets criteria for severe MALNUTRITION in the context of chronic illness as evidenced by 14% weight loss x 3 months and severe depletion of muscle mass.   INTERVENTION:  Resource Breeze TID, each supplement providing 250 kcal and 9 grams of protein   Magic Cup BID  Daily multivitamin  RD to continue to follow nutrition care plan  NUTRITION DIAGNOSIS: Inadequate oral intake related to poor appetite as evidenced by pt consuming 0- 25% of meals for 4-5 months.   Goal: Pt to meet >/=90% of estimated nutrition needs   Monitor:  PO intake, supplement acceptance, weight trend, labs   Reason for Assessment: Positive Malnutrition Screening  Tool Score   80 y.o. female  Admitting Dx: Acute on chronic respiratory failure  ASSESSMENT: 80 y.o. female with a history of Chronic Diastolic CHF on continuous NCO2 at home who was brought to the ED due to worsening SOB and DOE x 1 week worse over the past 2 days.  Pt's family reports that pt has had no appetite for 4-5 months and has not been eating well. She has weight loss of 14% x 3 months which is severe for time frame. Pt's daughter stated that pt will eat a few bites of her meal and state that she is full and cannot eat anymore.   Pt reports drinking Ensure and Boost at home, but prefers the [employer] as the Ensure upsets her stomach.   Palliative care team following.   Nutrition Focused Physical Exam:  Subcutaneous Fat:  Orbital Region: moderate- severe depletion  Upper Arm Region: mild depletion  Thoracic and Lumbar Region: n/a   Muscle:  Temple Region: severe depletion  Clavicle Bone Region: severe depletion  Clavicle and Acromion Bone Region: severe depletion  Scapular Bone Region: severe depletion  Dorsal Hand: n/a  Patellar Region: wnl Anterior Thigh Region: wnl Posterior Calf  Region: wnl  Edema: Non- pitting bilateral LE edema   Low potassium--> being repleted by MD  Height: Ht Readings from Last 1 Encounters:  04/13/14 4\' 7"  (1.397 m)    Weight: Wt Readings from Last 1 Encounters:  04/15/14 95 lb 7.4 oz (43.3 kg)    Ideal Body Weight: 92 lbs   % Ideal Body Weight: 103%   Wt Readings from Last 10 Encounters:  04/15/14 95 lb 7.4 oz (43.3 kg)  03/29/14 102 lb 12.8 oz (46.63 kg)  02/12/14 107 lb (48.535 kg)  01/03/14 110 lb 12.8 oz (50.259 kg)  12/31/13 110 lb 1.6 oz (49.941 kg)  12/26/13 114 lb 10.2 oz (52 kg)  12/24/13 112 lb 8 oz (51.03 kg)  10/17/13 119 lb 12.8 oz (54.341 kg)  06/20/13 111 lb 12.8 oz (50.712 kg)  03/27/13 115 lb 3.2 oz (52.254 kg)    Usual Body Weight: 115 lbs   % Usual Body Weight: 82%   BMI:  Body mass index is 22.19 kg/(m^2)., Normal   Estimated Nutritional Needs: Kcal: 1200 - 1400  Protein: 45 - 55 grams  Fluid: >/= 1.2 L/day   Skin: Non- pitting bilateral LE edema   Diet Order: Cardiac  EDUCATION NEEDS: -No education needs identified at this time   Intake/Output Summary (Last 24 hours) at 04/15/14 1154 Last data filed at 04/15/14 0849  Gross per 24 hour  Intake    440 ml  Output   1900 ml  Net  -1460 ml  Last BM: 6/14    Labs:   Recent Labs Lab 04/13/14 2106  04/13/14 2233 04/14/14 0040 04/15/14 0445  NA 137  < > 141 145 143  K 6.2*  < > 5.7* 4.4 2.6*  CL 101  < > 107 104 99  CO2 21  --   --  27 31  BUN 34*  < > 33* 33* 24*  CREATININE 1.25*  < > 1.30* 1.32* 1.12*  CALCIUM 9.7  --   --  9.7 8.5  GLUCOSE 191*  < > 151* 132* 70  < > = values in this interval not displayed.  CBG (last 3)  No results found for this basename: GLUCAP,  in the last 72 hours  Scheduled Meds: . amiodarone  200 mg Oral Daily  . apixaban  2.5 mg Oral BID  . artificial tears   Both Eyes QHS  . carvedilol  3.125 mg Oral BID WC  . cycloSPORINE  1 drop Both Eyes BID  . furosemide  40 mg Oral BID  .  guaiFENesin  1,200 mg Oral BID  . multivitamin  1 tablet Oral BID  . multivitamin with minerals  1 tablet Oral QPM  . polyvinyl alcohol  1 drop Both Eyes QHS  . potassium chloride  40 mEq Oral BID  . sodium chloride  3 mL Intravenous Q12H    Continuous Infusions:   Past Medical History  Diagnosis Date  . Coronary artery disease   . Peripheral neuropathy   . Aortic insufficiency     mild  . Cerumen impaction   . Unspecified diastolic heart failure   . Hyperlipemia   . Mitral valve prolapse     "severe" (09/22/2012)  . Mitral regurgitation     severe  . Urosepsis   . Anemia, unspecified   . Osteoporosis   . Adenocarcinoma, breast   . Irritable bladder   . Urinary incontinence   . Hypothyroidism   . H/O: whooping cough   . Hypercholesterolemia     "stopped statins ~ 2 yr ago" (09/22/2012)  . Heart murmur   . CHF (congestive heart failure)   . Shortness of breath     "basically all the time; worse w/exertion" (09/22/2012)  . Bleeding hemorrhoids ~ 2011  . Osteoarthritis     "knees, hips, hands" (09/22/2012)    Past Surgical History  Procedure Laterality Date  . Appendectomy  10/2004  . Cataract extraction w/ intraocular lens  implant, bilateral  ? 1990's  . Breast biopsy      "several on both sides; years ago; none since 1975" (09/22/2012)  . Tonsillectomy      "as a child" (09/22/2012)  . Mastectomy  ~ 1975    left breast  . Closed reduction patella fracture  ~ 1958    right     Carrolyn Leigh, BS Dietetic Intern Pager: [phone]

## 2014-04-15 NOTE — Progress Notes (Signed)
[facility] PROGRESS NOTE  Filed Weights   04/13/14 2350 04/14/14 0439 04/15/14 0611  Weight: 45.133 kg (99 lb 8 oz) 42.774 kg (94 lb 4.8 oz) 43.3 kg (95 lb 7.4 oz)        Intake/Output Summary (Last 24 hours) at 04/15/14 1106 Last data filed at 04/15/14 0849  Gross per 24 hour  Intake    440 ml  Output   1900 ml  Net  -1460 ml     Assessment/Plan: Acute on chronic respiratory failure Acute on chronic diastolic congestive heart failure - On IV lasix, no JVD change lasix to orals. cont coreg and amiodarone. - Pt seems to be below her lowest weight, Cr is at baseline. BNP 21000 ( above previous 12000) - She relates she feels full all the time and it is not hungry. - Has had decrease appetite for the last month. - family will like to meet with PMT  Hyperkalemia: - resolved with kayexalate and lasix. - ? Due to supplement. - recheck B-met.  Atrial Fibrillation: - On Eliquis, Carvedilol, and Amiodarone.   Chronic kidney disease (CKD), stage IV (severe) - Cr. No w ~ 1.1  HYPOTHYROIDISM, HX OF: - TSh has been high for some time. - recheck Free T4 WNL. - recheck in 4 week.    Code Status: DO NOT RESUSCITATE (DNR)  Family Communication: Family at Bedside  Disposition Plan: Inpatient    Consultants:  none  Procedures: ECHO: noen  Antibiotics:  None  HPI/Subjective: Relates breathing is about the same.  Objective: Filed Vitals:   04/14/14 1808 04/14/14 1958 04/15/14 0110 04/15/14 0611  BP: 118/48 97/46 104/43 120/54  Pulse: 64 62 64 73  Temp:  98.5 F (36.9 C) 98 F (36.7 C) 97.7 F (36.5 C)  TempSrc:  Oral Oral Oral  Resp: _0 Height:      Weight:    43.3 kg (95 lb 7.4 oz)  SpO2: 92% 98% 93% 91%     Exam:  General: Alert, awake, oriented x3, in no acute distress.  HEENT: No bruits, no goiter. -JVD Heart: Regular rate and rhythm. Lungs: Good air movement, clear. Abdomen: Soft, diffuse tenderness, nondistended, positive  bowel sounds.    Data Reviewed: Basic Metabolic Panel:  Recent Labs Lab 04/13/14 2106 04/13/14 2204 04/13/14 2233 04/14/14 0040 04/15/14 0445  NA 137 140 141 145 143  K 6.2* 6.0* 5.7* 4.4 2.6*  CL 101 107 107 104 99  CO2 21  --   --  27 31  GLUCOSE 191* 186* 151* 132* 70  BUN 34* 37* 33* 33* 24*  CREATININE 1.25* 1.40* 1.30* 1.32* 1.12*  CALCIUM 9.7  --   --  9.7 8.5   Liver Function Tests: No results found for this basename: AST, ALT, ALKPHOS, BILITOT, PROT, ALBUMIN,  in the last 168 hours No results found for this basename: LIPASE, AMYLASE,  in the last 168 hours No results found for this basename: AMMONIA,  in the last 168 hours CBC:  Recent Labs Lab 04/13/14 2106 04/13/14 2204 04/13/14 2233  WBC 9.0  --   --   HGB 10.3* 12.2 11.9*  HCT 33.1* 36.0 35.0*  MCV 95.7  --   --   PLT 255  --   --    Cardiac Enzymes: No results found for this basename: CKTOTAL, CKMB, CKMBINDEX, TROPONINI,  in the last 168 hours BNP (last 3 results)  Recent Labs  12/24/13 2000 12/26/13 2133 04/13/14 2106  PROBNP 11373.0* 12752.0* 21340.0*   CBG: No results found for this basename: GLUCAP,  in the last 168 hours  No results found for this or any previous visit (from the past 240 hour(s)).   Studies: Dg Chest Portable 1 View  04/13/2014   CLINICAL DATA:  Shortness of breath.  EXAM: PORTABLE CHEST - 1 VIEW  COMPARISON:  Prior radiograph from 12/26/2013.  FINDINGS: Cardiomegaly is stable as compared to prior exam. Tortuosity of the intrathoracic aorta noted. Calcifications present within the aortic arch. Surgical clips overlie the left axilla.  Lungs are hypoinflated with chronic coarsening of the interstitial markings, similar to prior. Patchy opacity present within the medial right lung base, which may reflect atelectasis and/ bronchovascular crowding. Possible infectious infiltrate could be considered in the correct clinical setting. Blunting of the bilateral costophrenic angles is  suggestive of tiny bilateral pleural effusions. No overt pulmonary edema. There is no pneumothorax.  Diffuse osteopenia noted.  No acute osseous abnormality.  IMPRESSION: 1. Shallow lung inflation with patchy opacity within the medial right lung base. Findings may reflect atelectasis and/ bronchovascular crowding related to hypoinflation. Possible infectious infiltrate could be considered in the correct clinical setting. 2. Probable small bilateral pleural effusions. 3. Stable cardiomegaly without overt pulmonary edema.   Electronically Signed   By: Jeannine Boga M.D.   On: 04/13/2014 21:29    Scheduled Meds: . amiodarone  200 mg Oral Daily  . apixaban  2.5 mg Oral BID  . artificial tears   Both Eyes QHS  . carvedilol  3.125 mg Oral BID WC  . cycloSPORINE  1 drop Both Eyes BID  . furosemide  40 mg Oral BID  . guaiFENesin  1,200 mg Oral BID  . multivitamin  1 tablet Oral BID  . multivitamin with minerals  1 tablet Oral QPM  . polyvinyl alcohol  1 drop Both Eyes QHS  . potassium chloride  40 mEq Oral BID  . sodium chloride  3 mL Intravenous Q12H   Continuous Infusions:    Charlynne Cousins  [facility] Pager [phone] If 8PM-8AM, please contact night-coverage at [email or web address], password [facility] 04/15/2014, 11:06 AM  LOS: 2 days     **Disclaimer: This note may have been dictated with voice recognition software. Similar sounding words can inadvertently be transcribed and this note may contain transcription errors which may not have been corrected upon publication of note.**

## 2014-04-15 NOTE — Progress Notes (Signed)
UR completed Gregery Walberg K. Staci Carver, RN, BSN, [place], CCM  04/15/2014 10:27 AM

## 2014-04-15 NOTE — Progress Notes (Signed)
[facility] has attempted to engage the patient/family by phone prior to this admission without success.  Spoke with son/HCPOA at bedside this am regarding services.  Noted that Hospice engagement is pending.  Will await further engagement until goals of care is resolved per Dr Olevia Bowens.  Of note, [facility] Care Management services does not replace or interfere with any services that are arranged by inpatient case management or social work.  For additional questions or referrals please contact Corliss Blacker BSN RN [facility] Liaison at [phone].

## 2014-04-15 NOTE — Care Management Note (Addendum)
  Page 2 of 2   04/16/2014     4:23:59 PM CARE MANAGEMENT NOTE 04/16/2014  Patient:  [name], [name]   Account Number:  [id number]  Date Initiated:  04/15/2014  Documentation initiated by:  Mariann Laster  Subjective/Objective Assessment:   CHF     Action/Plan:   CM to follow for dispositon needs   Anticipated DC Date:  04/16/2014   Anticipated DC Plan:  [place]  CM consult      [facility] Choice  HOME HEALTH   Choice offered to / List presented to:  C-5 Sibling   DME arranged  HOSPITAL BED      DME agency  [place] arranged  HH-1 RN  HH-10 DISEASE MANAGEMENT  HH-4 NURSE'S AIDE  HH-2 PT      [place].   Status of service:  Completed, signed off Medicare Important Message given?  YES (If response is "NO", the following Medicare IM given date fields will be blank) Date Medicare IM given:  04/15/2014 Date Additional Medicare IM given:    Discharge Disposition:  [place]  Per UR Regulation:  Reviewed for med. necessity/level of care/duration of stay  If discussed at [place] of Stay Meetings, dates discussed:    Comments:  Delmos Velaquez RN, BSN, MSHL, CCM  Nurse - Case Manager,  ([facility] [place])  [phone]  04/16/2014 PT RECS:  HH PT ( [place] notified of adddition) Family provided with [facility] list, Private pay list, Meals on [employer], and Hospice list. CM advised in process of self referral for Hospice and / or may call PCP for asssitance.   04/16/14 [place], RN, BSN, [employer] [phone] Spoke to pt and family at bedside concerning DME hospital bed.  Family were concerend about price.  NCM confirmed with Melene Muller ([place] for [facility] DME) that pt would not have a co-pay.  Ordered DME hospital bed to be delivered to: [address]; [place] [address].  Maico Mulvehill RN, BSN, MSHL, CCM  Nurse - Case Manager,  ([phone]  04/15/2014 From home with DTR/Claudia/POA [phone] cell. Supportive son/Gary [phone] - but lives out of state. Palliative Care Consult completed: Family not ready for DNR or Hospice - elects d/c home with HHS. Home DME:  Cane, walker, w/c, BSC, shower chair,  home oxygen Meds: Administered by DTR Transportation: yes [facility] Referral sent to Corliss Blacker Elects HHS with [facility]/Marie notified RN, CNA

## 2014-04-15 NOTE — Progress Notes (Signed)
Agree with intern assessment and intervention. Pryor Ochoa RD, LDN Inpatient Clinical Dietitian Pager: [phone] After Hours Pager: [phone]

## 2014-04-16 LAB — BASIC METABOLIC PANEL
BUN: 28 mg/dL — AB (ref 6–23)
CHLORIDE: 94 meq/L — AB (ref 96–112)
CO2: 28 meq/L (ref 19–32)
Calcium: 8.8 mg/dL (ref 8.4–10.5)
Creatinine, Ser: 1.14 mg/dL — ABNORMAL HIGH (ref 0.50–1.10)
GFR calc Af Amer: 46 mL/min — ABNORMAL LOW (ref >90)
GFR calc non Af Amer: 40 mL/min — ABNORMAL LOW (ref >90)
GLUCOSE: 71 mg/dL (ref 70–99)
POTASSIUM: 4.2 meq/L (ref 3.7–5.3)
Sodium: 136 mEq/L — ABNORMAL LOW (ref 137–147)

## 2014-04-16 MED ORDER — CARBIDOPA-LEVODOPA ER 25-100 MG PO TBCR
2.0000 | EXTENDED_RELEASE_TABLET | Freq: Once | ORAL | Status: AC
  Administered 2014-04-16: 2 via ORAL
  Filled 2014-04-16: qty 2

## 2014-04-16 NOTE — Progress Notes (Signed)
Pt complaining of restless leg syndrome, stated " I haven't had this kind of leg pain before, offered heat packs on the legs, daughter at bedside. K. Schorr was notified and ordered for Carbidopa-Levodopa ER 2 tablet PO. Pt verbalizes " the heat packs and the oral pills help a lot". Will continue to monitor pt.

## 2014-04-16 NOTE — Progress Notes (Signed)
CSW notified to assist patient/family with transport home via EMS.  RNCM has arranged for Home Health and DME as indicated.  EMS forms completed and referral in place. Notified patient's nurse- [name].  Out of Facility DNR signed by MD prior to d/c and placed with d/c papers for patient to return home.  [name]. [place], [place]

## 2014-04-16 NOTE — Progress Notes (Signed)
Patient discharged to home, transported by [facility].  IV removed prior to discharge; IV site clean, dry, and intact.  Discharge instructions discussed with patient and family prior to discharge; all voice understanding of information and deny any questions.  Will continue to monitor.

## 2014-04-16 NOTE — Discharge Summary (Signed)
Physician Discharge Summary  [name] UXN:[id number] DOB: 1918-01-24 DOA: 04/13/2014  PCP: Adella Hare, MD  Admit date: 04/13/2014 Discharge date: 04/16/2014  Time spent: 35  minutes  Recommendations for Outpatient Follow-up:  1. Follow up with heart failure clinic 1 week. 2. Follow up with PCP check a TSH and free T4.  Discharge Diagnoses:  Principal Problem:   Acute on chronic respiratory failure Active Problems:   Acute on chronic diastolic congestive heart failure   HYPERLIPIDEMIA   HYPOTHYROIDISM, HX OF   Atrial fibrillation   Hyperkalemia   Chronic kidney disease (CKD), stage IV (severe)   Valvular disease   Anemia   Protein-calorie malnutrition, severe   Discharge Condition: guarded  Diet recommendation: low sodium  Filed Weights   04/14/14 0439 04/15/14 0611 04/16/14 0458  Weight: 42.774 kg (94 lb 4.8 oz) 43.3 kg (95 lb 7.4 oz) 42.88 kg (94 lb 8.5 oz)    History of present illness:  80 y.o. female with a history of Chronic Diastolic CHF on continuous NCO2 at home who was brought to the ED due to worsening SOB and DOE x 1 week worse over the past 2 days At home her NCO2 is 2.5 liters and it was increased to 3.5 liters but when EMS arrived at her home she was found to have O2 sats of 88% and was placed on increased O2. She was evaluated in the ED and found to have an elevated BNP of 213400.0, and a chest X-Ray revealing Cardiomegaly and small bibasilar effusions. Her potassium level was also found to be elevated and she was administered IV lasix x1, and Kayexalate x1 and referred for medical admission.    Hospital Course:  Acute on chronic respiratory failure Acute on chronic diastolic congestive heart failure  - Started on IV lasix, no JVD change lasix to orals. cont coreg and amiodarone.  - Pt seems to be below her lowest weight, Cr is at baseline. BNP 21000 ( above previous 12000)  - She relates she feels full all the time and it is not hungry.  After  diuresis, appetite improved. - family will like to meet with PMT, no ready for advance directives.  Hyperkalemia:  - resolved with kayexalate and lasix.  - ? Due to supplement.   Atrial Fibrillation:  - On Eliquis, Carvedilol, and Amiodarone.   Chronic kidney disease (CKD), stage IV (severe)  - Cr. No w ~ 1.1   HYPOTHYROIDISM, HX OF:  - TSh has been high for some time.  - recheck Free T4 WNL.  - recheck in 4 week.    Procedures:  CXR  Consultations:  none  Discharge Exam: Filed Vitals:   04/16/14 0458  BP: 96/50  Pulse: 68  Temp: 99.1 F (37.3 C)  Resp: 18    General: A&O x3 Cardiovascular: RRR Respiratory: good air movement CTA B/L  Discharge Instructions You were cared for by a hospitalist during your hospital stay. If you have any questions about your discharge medications or the care you received while you were in the hospital after you are discharged, you can call the unit and asked to speak with the hospitalist on call if the hospitalist that took care of you is not available. Once you are discharged, your primary care physician will handle any further medical issues. Please note that NO REFILLS for any discharge medications will be authorized once you are discharged, as it is imperative that you return to your primary care physician (or establish a relationship with  a primary care physician if you do not have one) for your aftercare needs so that they can reassess your need for medications and monitor your lab values.      Discharge Instructions   Diet - low sodium heart healthy    Complete by:  As directed      Increase activity slowly    Complete by:  As directed             Medication List    STOP taking these medications       nitrofurantoin 50 MG capsule  Commonly known as:  MACRODANTIN      TAKE these medications       amiodarone 200 MG tablet  Commonly known as:  PACERONE  Take 1 tablet (200 mg total) by mouth daily.     carvedilol  3.125 MG tablet  Commonly known as:  COREG  Take 1 tablet (3.125 mg total) by mouth 2 (two) times daily with a meal.     cycloSPORINE 0.05 % ophthalmic emulsion  Commonly known as:  RESTASIS  Place 1 drop into both eyes 2 (two) times daily.     ELIQUIS 2.5 MG Tabs tablet  Generic drug:  apixaban  Take 2.5 mg by mouth 2 (two) times daily. 10am and 6:30pm     FRESHKOTE OP  Place 1 drop into both eyes 4 (four) times daily.     furosemide 40 MG tablet  Commonly known as:  LASIX  Take 60 mg by mouth 2 (two) times daily. 10am and 4pm     guaiFENesin 600 MG 12 hr tablet  Commonly known as:  MUCINEX  Take 1,200 mg by mouth 2 (two) times daily.     multivitamin with minerals Tabs tablet  Take 1 tablet by mouth every evening. 4pm     potassium chloride 10 MEQ CR capsule  Commonly known as:  MICRO-K  Take 40 mEq by mouth 3 (three) times daily. Open capsules and take with applesauce (10am, 4pm and 6:30pm)     PRESERVISION AREDS 2 Caps  Take 1 capsule by mouth 2 (two) times daily. 10am and 6:30pm     SYSTANE 0.4-0.3 % Gel  Generic drug:  Polyethyl Glycol-Propyl Glycol  Place 1 drop into both eyes at bedtime.       No Known Allergies Follow-up Information   Follow up with [facility] In 1 week. (hospital follow up)    Specialty:  Cardiology   Contact information:   [address] [id number] [place] [address] [phone]      Follow up with [facility]. (Registered Nurse and Certified Nurse Aide  - services to start within 24-48 hours of discharge. )    Contact information:   [address] [place] [address] [phone]       Follow up with Adella Hare, MD In 4 weeks. (check TSH and free T4)    Specialty:  Internal Medicine   Contact information:   [address] [place] [address] [phone]        The results of significant diagnostics from this hospitalization  (including imaging, microbiology, ancillary and laboratory) are listed below for reference.    Significant Diagnostic Studies: Dg Chest Portable 1 View  04/13/2014   CLINICAL DATA:  Shortness of breath.  EXAM: PORTABLE CHEST - 1 VIEW  COMPARISON:  Prior radiograph from 12/26/2013.  FINDINGS: Cardiomegaly is stable as compared to prior exam. Tortuosity  of the intrathoracic aorta noted. Calcifications present within the aortic arch. Surgical clips overlie the left axilla.  Lungs are hypoinflated with chronic coarsening of the interstitial markings, similar to prior. Patchy opacity present within the medial right lung base, which may reflect atelectasis and/ bronchovascular crowding. Possible infectious infiltrate could be considered in the correct clinical setting. Blunting of the bilateral costophrenic angles is suggestive of tiny bilateral pleural effusions. No overt pulmonary edema. There is no pneumothorax.  Diffuse osteopenia noted.  No acute osseous abnormality.  IMPRESSION: 1. Shallow lung inflation with patchy opacity within the medial right lung base. Findings may reflect atelectasis and/ bronchovascular crowding related to hypoinflation. Possible infectious infiltrate could be considered in the correct clinical setting. 2. Probable small bilateral pleural effusions. 3. Stable cardiomegaly without overt pulmonary edema.   Electronically Signed   By: Jeannine Boga M.D.   On: 04/13/2014 21:29    Microbiology: No results found for this or any previous visit (from the past 240 hour(s)).   Labs: Basic Metabolic Panel:  Recent Labs Lab 04/13/14 2106 04/13/14 2204 04/13/14 2233 04/14/14 0040 04/15/14 0445 04/16/14 0342  NA 137 140 141 145 143 136*  K 6.2* 6.0* 5.7* 4.4 2.6* 4.2  CL 101 107 107 104 99 94*  CO2 21  --   --  27 31 28   GLUCOSE 191* 186* 151* 132* 70 71  BUN 34* 37* 33* 33* 24* 28*  CREATININE 1.25* 1.40* 1.30* 1.32* 1.12* 1.14*  CALCIUM 9.7  --   --  9.7 8.5 8.8    Liver Function Tests: No results found for this basename: AST, ALT, ALKPHOS, BILITOT, PROT, ALBUMIN,  in the last 168 hours No results found for this basename: LIPASE, AMYLASE,  in the last 168 hours No results found for this basename: AMMONIA,  in the last 168 hours CBC:  Recent Labs Lab 04/13/14 2106 04/13/14 2204 04/13/14 2233  WBC 9.0  --   --   HGB 10.3* 12.2 11.9*  HCT 33.1* 36.0 35.0*  MCV 95.7  --   --   PLT 255  --   --    Cardiac Enzymes: No results found for this basename: CKTOTAL, CKMB, CKMBINDEX, TROPONINI,  in the last 168 hours BNP: BNP (last 3 results)  Recent Labs  12/24/13 2000 12/26/13 2133 04/13/14 2106  PROBNP 11373.0* 12752.0* 21340.0*   CBG: No results found for this basename: GLUCAP,  in the last 168 hours   Signed:  Charlynne Cousins  [facility] 04/16/2014, 8:50 AM

## 2014-04-16 NOTE — Progress Notes (Signed)
Patient GI:[name]      DOB: June 01, 1918      IXV:[id number]  Late entry for 6/15  Brief follow up with son and daughter for patient.  Patient not seen .  Family decided to go home with home health support.  Empowered them to check in with [facility] care management who could help coordinate care.  Also empowered them to self refer to hospice at any time.   Yoshiharu Brassell L. Lovena Le, MD MBA The Palliative Medicine Team at [facility] Phone: [phone] Pager: [phone]

## 2014-04-17 ENCOUNTER — Telehealth (HOSPITAL_COMMUNITY): Payer: Self-pay | Admitting: Cardiology

## 2014-04-17 DIAGNOSIS — I5033 Acute on chronic diastolic (congestive) heart failure: Secondary | ICD-10-CM

## 2014-04-17 DIAGNOSIS — I509 Heart failure, unspecified: Secondary | ICD-10-CM

## 2014-04-17 DIAGNOSIS — I059 Rheumatic mitral valve disease, unspecified: Secondary | ICD-10-CM

## 2014-04-17 DIAGNOSIS — J9620 Acute and chronic respiratory failure, unspecified whether with hypoxia or hypercapnia: Secondary | ICD-10-CM

## 2014-04-17 NOTE — Telephone Encounter (Signed)
Tonya,RN with [facility] called during [facility] visit with concerns about decreased o2 sats When pt first got up this am Sat= low 80's (2.5L via nasal canula)  Rn arrived and placed pt on a mask increased oxygen to 5L Sats increased to 88-90% Pt does have SOB while sitting up in chair, has not taken any AM meds  Per vo Dr.Bensimhon Pt should report back to ER for further evaluation of respiratory  concerns Family may need Hospice consult again  Tonya,RN aware however family would much rather not have pt return for re evaluation Nurse able to get pt stable on 2L via nasal canula, sitting up right in chair, po meds given to pt Pt states she is fine Denies increased SOB at this time Plan- nurse to return to home for visit x 2 days and social worker will also go to home for visit to discuss options beyond that point Hospital bed to be delivered into home today- family feels pt falls over/crouches while she is sleeping and begins to mouth breathe

## 2014-04-18 ENCOUNTER — Telehealth: Payer: Self-pay | Admitting: *Deleted

## 2014-04-18 ENCOUNTER — Telehealth (HOSPITAL_COMMUNITY): Payer: Self-pay | Admitting: Vascular Surgery

## 2014-04-18 NOTE — Telephone Encounter (Signed)
Left msg on triage requesting call back. Called nurse back she stated she went out to see pt today BP 110/64 Respiration 32, had a little wheezing. Family is wanting md to rx something for anxiety. Want be still be up and down all night. Inform nurse before any md can rx something for the patient she has to be seen. Nurse stated will let the family know to call for appt...[name]

## 2014-04-18 NOTE — Telephone Encounter (Signed)
[name] from [facility] left a voicemail she would like to speak with a nurse concerning this pt.

## 2014-04-21 ENCOUNTER — Encounter (HOSPITAL_COMMUNITY): Payer: Self-pay | Admitting: Emergency Medicine

## 2014-04-21 ENCOUNTER — Emergency Department (HOSPITAL_COMMUNITY): Payer: Medicare Other

## 2014-04-21 ENCOUNTER — Inpatient Hospital Stay (HOSPITAL_COMMUNITY)
Admission: EM | Admit: 2014-04-21 | Discharge: 2014-04-26 | DRG: 291 | Disposition: A | Discharge status: Home / Self Care | Payer: Medicare Other | Attending: Internal Medicine | Admitting: Internal Medicine

## 2014-04-21 DIAGNOSIS — E785 Hyperlipidemia, unspecified: Secondary | ICD-10-CM

## 2014-04-21 DIAGNOSIS — T462X1A Poisoning by other antidysrhythmic drugs, accidental (unintentional), initial encounter: Secondary | ICD-10-CM

## 2014-04-21 DIAGNOSIS — E032 Hypothyroidism due to medicaments and other exogenous substances: Secondary | ICD-10-CM | POA: Diagnosis present

## 2014-04-21 DIAGNOSIS — I130 Hypertensive heart and chronic kidney disease with heart failure and stage 1 through stage 4 chronic kidney disease, or unspecified chronic kidney disease: Secondary | ICD-10-CM | POA: Diagnosis present

## 2014-04-21 DIAGNOSIS — D638 Anemia in other chronic diseases classified elsewhere: Secondary | ICD-10-CM

## 2014-04-21 DIAGNOSIS — Z862 Personal history of diseases of the blood and blood-forming organs and certain disorders involving the immune mechanism: Secondary | ICD-10-CM

## 2014-04-21 DIAGNOSIS — E875 Hyperkalemia: Secondary | ICD-10-CM

## 2014-04-21 DIAGNOSIS — Z820 Family history of epilepsy and other diseases of the nervous system: Secondary | ICD-10-CM

## 2014-04-21 DIAGNOSIS — I2510 Atherosclerotic heart disease of native coronary artery without angina pectoris: Secondary | ICD-10-CM | POA: Diagnosis present

## 2014-04-21 DIAGNOSIS — N189 Chronic kidney disease, unspecified: Secondary | ICD-10-CM | POA: Diagnosis present

## 2014-04-21 DIAGNOSIS — I4891 Unspecified atrial fibrillation: Secondary | ICD-10-CM

## 2014-04-21 DIAGNOSIS — Z66 Do not resuscitate: Secondary | ICD-10-CM | POA: Diagnosis present

## 2014-04-21 DIAGNOSIS — I5021 Acute systolic (congestive) heart failure: Secondary | ICD-10-CM

## 2014-04-21 DIAGNOSIS — I340 Nonrheumatic mitral (valve) insufficiency: Secondary | ICD-10-CM

## 2014-04-21 DIAGNOSIS — I509 Heart failure, unspecified: Secondary | ICD-10-CM

## 2014-04-21 DIAGNOSIS — Z8639 Personal history of other endocrine, nutritional and metabolic disease: Secondary | ICD-10-CM

## 2014-04-21 DIAGNOSIS — Z823 Family history of stroke: Secondary | ICD-10-CM

## 2014-04-21 DIAGNOSIS — M159 Polyosteoarthritis, unspecified: Secondary | ICD-10-CM | POA: Diagnosis present

## 2014-04-21 DIAGNOSIS — I482 Chronic atrial fibrillation, unspecified: Secondary | ICD-10-CM

## 2014-04-21 DIAGNOSIS — J9621 Acute and chronic respiratory failure with hypoxia: Secondary | ICD-10-CM

## 2014-04-21 DIAGNOSIS — N390 Urinary tract infection, site not specified: Secondary | ICD-10-CM

## 2014-04-21 DIAGNOSIS — Z853 Personal history of malignant neoplasm of breast: Secondary | ICD-10-CM

## 2014-04-21 DIAGNOSIS — Z803 Family history of malignant neoplasm of breast: Secondary | ICD-10-CM

## 2014-04-21 DIAGNOSIS — Z8249 Family history of ischemic heart disease and other diseases of the circulatory system: Secondary | ICD-10-CM

## 2014-04-21 DIAGNOSIS — I5031 Acute diastolic (congestive) heart failure: Secondary | ICD-10-CM

## 2014-04-21 DIAGNOSIS — A419 Sepsis, unspecified organism: Secondary | ICD-10-CM | POA: Diagnosis not present

## 2014-04-21 DIAGNOSIS — R0600 Dyspnea, unspecified: Secondary | ICD-10-CM

## 2014-04-21 DIAGNOSIS — I080 Rheumatic disorders of both mitral and aortic valves: Secondary | ICD-10-CM

## 2014-04-21 DIAGNOSIS — G609 Hereditary and idiopathic neuropathy, unspecified: Secondary | ICD-10-CM | POA: Diagnosis present

## 2014-04-21 DIAGNOSIS — A4151 Sepsis due to Escherichia coli [E. coli]: Secondary | ICD-10-CM

## 2014-04-21 DIAGNOSIS — G9340 Encephalopathy, unspecified: Secondary | ICD-10-CM

## 2014-04-21 DIAGNOSIS — I5033 Acute on chronic diastolic (congestive) heart failure: Principal | ICD-10-CM

## 2014-04-21 DIAGNOSIS — N184 Chronic kidney disease, stage 4 (severe): Secondary | ICD-10-CM

## 2014-04-21 DIAGNOSIS — E43 Unspecified severe protein-calorie malnutrition: Secondary | ICD-10-CM

## 2014-04-21 DIAGNOSIS — M810 Age-related osteoporosis without current pathological fracture: Secondary | ICD-10-CM | POA: Diagnosis present

## 2014-04-21 DIAGNOSIS — Z515 Encounter for palliative care: Secondary | ICD-10-CM

## 2014-04-21 DIAGNOSIS — Z7901 Long term (current) use of anticoagulants: Secondary | ICD-10-CM

## 2014-04-21 DIAGNOSIS — R946 Abnormal results of thyroid function studies: Secondary | ICD-10-CM

## 2014-04-21 DIAGNOSIS — I38 Endocarditis, valve unspecified: Secondary | ICD-10-CM

## 2014-04-21 DIAGNOSIS — E039 Hypothyroidism, unspecified: Secondary | ICD-10-CM | POA: Diagnosis present

## 2014-04-21 DIAGNOSIS — I5032 Chronic diastolic (congestive) heart failure: Secondary | ICD-10-CM

## 2014-04-21 DIAGNOSIS — N179 Acute kidney failure, unspecified: Secondary | ICD-10-CM

## 2014-04-21 DIAGNOSIS — D649 Anemia, unspecified: Secondary | ICD-10-CM | POA: Diagnosis present

## 2014-04-21 LAB — CBC WITH DIFFERENTIAL/PLATELET
BASOS PCT: 0 % (ref 0–1)
Basophils Absolute: 0 10*3/uL (ref 0.0–0.1)
Eosinophils Absolute: 0.2 10*3/uL (ref 0.0–0.7)
Eosinophils Relative: 2 % (ref 0–5)
HEMATOCRIT: 36.5 % (ref 36.0–46.0)
Hemoglobin: 11.5 g/dL — ABNORMAL LOW (ref 12.0–15.0)
Lymphocytes Relative: 17 % (ref 12–46)
Lymphs Abs: 1.6 10*3/uL (ref 0.7–4.0)
MCH: 30.3 pg (ref 26.0–34.0)
MCHC: 31.5 g/dL (ref 30.0–36.0)
MCV: 96.3 fL (ref 78.0–100.0)
MONO ABS: 0.8 10*3/uL (ref 0.1–1.0)
MONOS PCT: 8 % (ref 3–12)
NEUTROS PCT: 73 % (ref 43–77)
Neutro Abs: 7.3 10*3/uL (ref 1.7–7.7)
Platelets: 253 10*3/uL (ref 150–400)
RBC: 3.79 MIL/uL — ABNORMAL LOW (ref 3.87–5.11)
RDW: 15.8 % — AB (ref 11.5–15.5)
WBC: 9.9 10*3/uL (ref 4.0–10.5)

## 2014-04-21 LAB — PRO B NATRIURETIC PEPTIDE: PRO B NATRI PEPTIDE: 21038 pg/mL — AB (ref 0–450)

## 2014-04-21 LAB — I-STAT TROPONIN, ED: Troponin i, poc: 0.02 ng/mL (ref 0.00–0.08)

## 2014-04-21 NOTE — ED Notes (Signed)
PT placed in gown and in bed. Pt monitored by 12-lead, bp cuff, and pulse ox.

## 2014-04-21 NOTE — ED Notes (Signed)
Family x2 into room, at [facility].

## 2014-04-21 NOTE — ED Notes (Addendum)
Pt recently d/c'd after CHF admission, pt of Dr. Missy Sabins. C/o sob yesterday, worse today. Family wanted her assessed. C/o sob, increased wob noted. (no pedal edema, rales, CP,JVD or respiritory distress noted), 96% RA, up to 98% on 4L, wears 3L at home, EMS reports some rhonchi, NSL started PTA, no meds given PTA, neb at home yesterday and this am, NSR RBBB, HR 56, 1st degree AV block. Alert, NAD, calm, interactive. Recent notes re: home health, DNR, hospice and palliative care.

## 2014-04-21 NOTE — ED Notes (Signed)
Xray at BS 

## 2014-04-21 NOTE — ED Notes (Signed)
Lab at BS 

## 2014-04-22 ENCOUNTER — Encounter (HOSPITAL_COMMUNITY): Payer: Self-pay | Admitting: Internal Medicine

## 2014-04-22 ENCOUNTER — Inpatient Hospital Stay (HOSPITAL_COMMUNITY): Payer: Medicare Other

## 2014-04-22 ENCOUNTER — Ambulatory Visit: Payer: Medicare Other | Admitting: Internal Medicine

## 2014-04-22 DIAGNOSIS — I509 Heart failure, unspecified: Secondary | ICD-10-CM | POA: Insufficient documentation

## 2014-04-22 DIAGNOSIS — E875 Hyperkalemia: Secondary | ICD-10-CM

## 2014-04-22 DIAGNOSIS — I4891 Unspecified atrial fibrillation: Secondary | ICD-10-CM

## 2014-04-22 DIAGNOSIS — N390 Urinary tract infection, site not specified: Secondary | ICD-10-CM | POA: Diagnosis present

## 2014-04-22 LAB — CBC WITH DIFFERENTIAL/PLATELET
BASOS ABS: 0 10*3/uL (ref 0.0–0.1)
Basophils Relative: 0 % (ref 0–1)
EOS PCT: 2 % (ref 0–5)
Eosinophils Absolute: 0.1 10*3/uL (ref 0.0–0.7)
HCT: 35.3 % — ABNORMAL LOW (ref 36.0–46.0)
Hemoglobin: 10.9 g/dL — ABNORMAL LOW (ref 12.0–15.0)
LYMPHS PCT: 23 % (ref 12–46)
Lymphs Abs: 1.9 10*3/uL (ref 0.7–4.0)
MCH: 29.7 pg (ref 26.0–34.0)
MCHC: 30.9 g/dL (ref 30.0–36.0)
MCV: 96.2 fL (ref 78.0–100.0)
Monocytes Absolute: 0.8 10*3/uL (ref 0.1–1.0)
Monocytes Relative: 10 % (ref 3–12)
NEUTROS ABS: 5.4 10*3/uL (ref 1.7–7.7)
NEUTROS PCT: 65 % (ref 43–77)
Platelets: 224 10*3/uL (ref 150–400)
RBC: 3.67 MIL/uL — ABNORMAL LOW (ref 3.87–5.11)
RDW: 15.7 % — AB (ref 11.5–15.5)
WBC: 8.2 10*3/uL (ref 4.0–10.5)

## 2014-04-22 LAB — URINALYSIS, ROUTINE W REFLEX MICROSCOPIC
BILIRUBIN URINE: NEGATIVE
GLUCOSE, UA: NEGATIVE mg/dL
KETONES UR: NEGATIVE mg/dL
Nitrite: POSITIVE — AB
PROTEIN: NEGATIVE mg/dL
Specific Gravity, Urine: 1.012 (ref 1.005–1.030)
Urobilinogen, UA: 1 mg/dL (ref 0.0–1.0)
pH: 5 (ref 5.0–8.0)

## 2014-04-22 LAB — I-STAT TROPONIN, ED: Troponin i, poc: 0.03 ng/mL (ref 0.00–0.08)

## 2014-04-22 LAB — COMPREHENSIVE METABOLIC PANEL
ALK PHOS: 83 U/L (ref 39–117)
ALT: 21 U/L (ref 0–35)
AST: 33 U/L (ref 0–37)
Albumin: 2.4 g/dL — ABNORMAL LOW (ref 3.5–5.2)
BILIRUBIN TOTAL: 0.4 mg/dL (ref 0.3–1.2)
BUN: 29 mg/dL — ABNORMAL HIGH (ref 6–23)
CALCIUM: 9.6 mg/dL (ref 8.4–10.5)
CO2: 26 mEq/L (ref 19–32)
Chloride: 102 mEq/L (ref 96–112)
Creatinine, Ser: 1.27 mg/dL — ABNORMAL HIGH (ref 0.50–1.10)
GFR calc non Af Amer: 35 mL/min — ABNORMAL LOW (ref >90)
GFR, EST AFRICAN AMERICAN: 41 mL/min — AB (ref >90)
Glucose, Bld: 67 mg/dL — ABNORMAL LOW (ref 70–99)
Potassium: 5.2 mEq/L (ref 3.7–5.3)
SODIUM: 140 meq/L (ref 137–147)
Total Protein: 7.6 g/dL (ref 6.0–8.3)

## 2014-04-22 LAB — BASIC METABOLIC PANEL
BUN: 32 mg/dL — AB (ref 6–23)
CO2: 26 mEq/L (ref 19–32)
CREATININE: 1.42 mg/dL — AB (ref 0.50–1.10)
Calcium: 9.6 mg/dL (ref 8.4–10.5)
Chloride: 98 mEq/L (ref 96–112)
GFR calc Af Amer: 35 mL/min — ABNORMAL LOW (ref >90)
GFR calc non Af Amer: 31 mL/min — ABNORMAL LOW (ref >90)
Glucose, Bld: 128 mg/dL — ABNORMAL HIGH (ref 70–99)
Potassium: 7 mEq/L (ref 3.7–5.3)
Sodium: 136 mEq/L — ABNORMAL LOW (ref 137–147)

## 2014-04-22 LAB — URINE MICROSCOPIC-ADD ON

## 2014-04-22 LAB — POTASSIUM: Potassium: 5.4 mEq/L — ABNORMAL HIGH (ref 3.7–5.3)

## 2014-04-22 LAB — MAGNESIUM: Magnesium: 2 mg/dL (ref 1.5–2.5)

## 2014-04-22 MED ORDER — FUROSEMIDE 10 MG/ML IJ SOLN
60.0000 mg | Freq: Two times a day (BID) | INTRAMUSCULAR | Status: DC
  Filled 2014-04-22 (×2): qty 6

## 2014-04-22 MED ORDER — ONDANSETRON HCL 4 MG/2ML IJ SOLN: 4.0000 mg | Freq: Four times a day (QID) | INTRAMUSCULAR | Status: DC | PRN

## 2014-04-22 MED ORDER — SODIUM CHLORIDE 0.9 % IJ SOLN
3.0000 mL | Freq: Two times a day (BID) | INTRAMUSCULAR | Status: DC
  Administered 2014-04-22 – 2014-04-23 (×3): 3 mL via INTRAVENOUS

## 2014-04-22 MED ORDER — CARVEDILOL 3.125 MG PO TABS
3.1250 mg | ORAL_TABLET | Freq: Two times a day (BID) | ORAL | Status: DC
  Administered 2014-04-24 – 2014-04-26 (×5): 3.125 mg via ORAL
  Filled 2014-04-22 (×11): qty 1

## 2014-04-22 MED ORDER — SODIUM CHLORIDE 0.9 % IJ SOLN
3.0000 mL | Freq: Two times a day (BID) | INTRAMUSCULAR | Status: DC
  Administered 2014-04-22 – 2014-04-26 (×8): 3 mL via INTRAVENOUS

## 2014-04-22 MED ORDER — INSULIN ASPART 100 UNIT/ML IV SOLN
8.0000 [IU] | Freq: Once | INTRAVENOUS | Status: AC
  Administered 2014-04-22: 8 [IU] via INTRAVENOUS
  Filled 2014-04-22: qty 0.08

## 2014-04-22 MED ORDER — AMIODARONE HCL 200 MG PO TABS
200.0000 mg | ORAL_TABLET | Freq: Every day | ORAL | Status: DC
Start: 2014-04-22 — End: 2014-04-26
  Administered 2014-04-22 – 2014-04-26 (×5): 200 mg via ORAL
  Filled 2014-04-22 (×5): qty 1

## 2014-04-22 MED ORDER — DEXTROSE 5 % IV SOLN: 1.0000 g | INTRAVENOUS | Status: DC

## 2014-04-22 MED ORDER — SODIUM CHLORIDE 0.9 % IV SOLN
1.0000 g | Freq: Once | INTRAVENOUS | Status: AC
  Administered 2014-04-22: 1 g via INTRAVENOUS
  Filled 2014-04-22: qty 10

## 2014-04-22 MED ORDER — APIXABAN 2.5 MG PO TABS
2.5000 mg | ORAL_TABLET | Freq: Two times a day (BID) | ORAL | Status: DC
  Administered 2014-04-22 – 2014-04-26 (×9): 2.5 mg via ORAL
  Filled 2014-04-22 (×10): qty 1

## 2014-04-22 MED ORDER — POLYETHYL GLYCOL-PROPYL GLYCOL 0.4-0.3 % OP GEL: 1.0000 [drp] | Freq: Every day | OPHTHALMIC | Status: DC

## 2014-04-22 MED ORDER — LORAZEPAM 2 MG/ML IJ SOLN
0.5000 mg | Freq: Once | INTRAMUSCULAR | Status: AC
  Administered 2014-04-22: 0.5 mg via INTRAVENOUS
  Filled 2014-04-22: qty 1

## 2014-04-22 MED ORDER — DEXTROSE 5 % IV SOLN
1.0000 g | Freq: Once | INTRAVENOUS | Status: AC
  Administered 2014-04-22: 1 g via INTRAVENOUS
  Filled 2014-04-22: qty 10

## 2014-04-22 MED ORDER — ALBUTEROL SULFATE (2.5 MG/3ML) 0.083% IN NEBU
5.0000 mg | INHALATION_SOLUTION | Freq: Once | RESPIRATORY_TRACT | Status: AC
  Administered 2014-04-22: 5 mg via RESPIRATORY_TRACT
  Filled 2014-04-22: qty 6

## 2014-04-22 MED ORDER — ONDANSETRON HCL 4 MG PO TABS: 4.0000 mg | ORAL_TABLET | Freq: Four times a day (QID) | ORAL | Status: DC | PRN

## 2014-04-22 MED ORDER — POLYVINYL ALCOHOL 1.4 % OP SOLN
1.0000 [drp] | Freq: Every day | OPHTHALMIC | Status: DC
  Administered 2014-04-22 – 2014-04-24 (×3): 1 [drp] via OPHTHALMIC
  Filled 2014-04-22 (×2): qty 15

## 2014-04-22 MED ORDER — CYCLOSPORINE 0.05 % OP EMUL
1.0000 [drp] | Freq: Two times a day (BID) | OPHTHALMIC | Status: DC
  Administered 2014-04-22 – 2014-04-26 (×9): 1 [drp] via OPHTHALMIC
  Filled 2014-04-22 (×10): qty 1

## 2014-04-22 MED ORDER — DEXTROSE 5 % IV SOLN
1.0000 g | INTRAVENOUS | Status: DC
  Administered 2014-04-23: 1 g via INTRAVENOUS
  Filled 2014-04-22 (×2): qty 10

## 2014-04-22 MED ORDER — FUROSEMIDE 10 MG/ML IJ SOLN
40.0000 mg | Freq: Once | INTRAMUSCULAR | Status: AC
  Administered 2014-04-22: 40 mg via INTRAVENOUS
  Filled 2014-04-22: qty 4

## 2014-04-22 MED ORDER — ACETAMINOPHEN 325 MG PO TABS
650.0000 mg | ORAL_TABLET | Freq: Four times a day (QID) | ORAL | Status: DC | PRN
  Administered 2014-04-25: 650 mg via ORAL
  Filled 2014-04-22: qty 2

## 2014-04-22 MED ORDER — ACETAMINOPHEN 650 MG RE SUPP: 650.0000 mg | Freq: Four times a day (QID) | RECTAL | Status: DC | PRN

## 2014-04-22 MED ORDER — LORAZEPAM 2 MG/ML IJ SOLN
0.2500 mg | Freq: Once | INTRAMUSCULAR | Status: DC
Start: 2014-04-22 — End: 2014-04-22

## 2014-04-22 MED ORDER — DEXTROSE 50 % IV SOLN
50.0000 mL | Freq: Once | INTRAVENOUS | Status: AC
  Administered 2014-04-22: 50 mL via INTRAVENOUS
  Filled 2014-04-22: qty 50

## 2014-04-22 MED ORDER — ALPRAZOLAM 0.25 MG PO TABS
0.2500 mg | ORAL_TABLET | Freq: Three times a day (TID) | ORAL | Status: DC | PRN
  Administered 2014-04-22 – 2014-04-23 (×2): 0.25 mg via ORAL
  Filled 2014-04-22 (×2): qty 1

## 2014-04-22 NOTE — Progress Notes (Signed)
Utilization review completed. Frederick Marro, RN, BSN. 

## 2014-04-22 NOTE — Progress Notes (Signed)
Report given to receiving RN. Patient in bed resting with family at bed side. No signs or symptoms of distress or discomfort noted. 

## 2014-04-22 NOTE — ED Notes (Signed)
Pt alert, NAD, calm, interactive, increased wob lessened and improved, skin W&D, neb complete, used BP, calcium infusing, family x2 at [facility], VSS.

## 2014-04-22 NOTE — Progress Notes (Signed)
BP is 92/42 and is scheduled for coreg. Patient is asymptomatic and does not appear in distress or discomfort. MD notified. Ok to hold medication. Will continue to monitor patient for further changes in condition.

## 2014-04-22 NOTE — Progress Notes (Signed)
BP is 98/42 and is scheduled for coreg. MD notified. Ok to hold medication. Will continue to monitor patient for further changes in condition.

## 2014-04-22 NOTE — ED Notes (Signed)
Pt in xray. Attempted report.

## 2014-04-22 NOTE — ED Notes (Signed)
Dr. Hal Hope, admitting, at [facility]. Family x2 at [facility]. Pt sleeping/resting, NAD, calm, arousable to voice.

## 2014-04-22 NOTE — Plan of Care (Signed)
Problem: Phase I Progression Outcomes Goal: EF % per last Echo/documented,Core Reminder form on chart Outcome: Completed/Met Date Met:  04/22/14 EF 55-60% per echo on 12/25/13

## 2014-04-22 NOTE — H&P (Signed)
[facility] History and Physical  [name] XTA:[id number] DOB: 02/18/1918 DOA: 04/21/2014  Referring physician: ER physician. PCP: Adella Hare, MD   Chief Complaint: Shortness of breath.  HPI: [name] is a 80 y.o. female history of CHF last EF measured was 55% with severe mitral regurgitation, hypothyroidism, atrial fibrillation, chronic kidney disease, chronic anemia who was recently admitted for CHF exacerbation was brought to the ER after patient became increasingly short of breath yesterday. On exam patient also has lower extremity edema and was complaining of right lower chest pain. Labs reveal increased potassium and was given Lasix calcium albuterol D50 and insulin. Repeat potassium levels have come back to around 5.4 from 7. Chest x-ray shows congestion. UA shows features concerning for UTI. Patient has chronic cough. Patient otherwise afebrile has no nausea vomiting abdominal pain diarrhea patient's right sided back pain has improved at this time.   Review of Systems: As presented in the history of presenting illness, rest negative.  Past Medical History  Diagnosis Date  . Coronary artery disease   . Peripheral neuropathy   . Aortic insufficiency     mild  . Cerumen impaction   . Unspecified diastolic heart failure   . Hyperlipemia   . Mitral valve prolapse     "severe" (09/22/2012)  . Mitral regurgitation     severe  . Urosepsis   . Anemia, unspecified   . Osteoporosis   . Adenocarcinoma, breast   . Irritable bladder   . Urinary incontinence   . Hypothyroidism   . H/O: whooping cough   . Hypercholesterolemia     "stopped statins ~ 2 yr ago" (09/22/2012)  . Heart murmur   . CHF (congestive heart failure)   . Shortness of breath     "basically all the time; worse w/exertion" (09/22/2012)  . Bleeding hemorrhoids ~ 2011  . Osteoarthritis     "knees, hips, hands" (09/22/2012)   Past Surgical History  Procedure Laterality Date  .  Appendectomy  10/2004  . Cataract extraction w/ intraocular lens  implant, bilateral  ? 1990's  . Breast biopsy      "several on both sides; years ago; none since 1975" (09/22/2012)  . Tonsillectomy      "as a child" (09/22/2012)  . Mastectomy  ~ 1975    left breast  . Closed reduction patella fracture  ~ 1958    right    Social History:  reports that she has never smoked. She has never used smokeless tobacco. She reports that she does not drink alcohol or use illicit drugs. Where does patient live Home. Can patient participate in ADLs? Not sure.  No Known Allergies  Family History:  Family History  Problem Relation Age of Onset  . Heart disease Mother   . Cancer Mother     breast cancer  . Stroke Father   . Heart disease Father   . Heart disease Sister   . Alzheimer's disease Sister   . Heart disease Sister   . Heart disease Brother       Prior to Admission medications   Medication Sig Start Date End Date Taking? Authorizing Provider  ALPRAZolam (XANAX) 0.25 MG tablet Take 0.25 mg by mouth 3 (three) times daily as needed for anxiety.   Yes Historical Provider, MD  amiodarone (PACERONE) 200 MG tablet Take 1 tablet (200 mg total) by mouth daily. 02/12/14  Yes Amy D Clegg, NP  apixaban (ELIQUIS) 2.5 MG TABS tablet Take 2.5 mg by mouth  2 (two) times daily. 10am and 6:30pm   Yes Historical Provider, MD  carvedilol (COREG) 3.125 MG tablet Take 1 tablet (3.125 mg total) by mouth 2 (two) times daily with a meal.   Yes Jolaine Artist, MD  cycloSPORINE (RESTASIS) 0.05 % ophthalmic emulsion Place 1 drop into both eyes 2 (two) times daily.    Yes Historical Provider, MD  furosemide (LASIX) 40 MG tablet Take 60 mg by mouth 2 (two) times daily. 10am and 4pm   Yes Historical Provider, MD  guaiFENesin (MUCINEX) 600 MG 12 hr tablet Take 1,200 mg by mouth 2 (two) times daily.   Yes Historical Provider, MD  Multiple Vitamin (MULTIVITAMIN WITH MINERALS) TABS tablet Take 1 tablet by mouth  every evening. 4pm   Yes Historical Provider, MD  Multiple Vitamins-Minerals (PRESERVISION AREDS 2) CAPS Take 1 capsule by mouth 2 (two) times daily. 10am and 6:30pm   Yes Historical Provider, MD  nitrofurantoin (MACRODANTIN) 50 MG capsule Take 50 mg by mouth daily.   Yes Historical Provider, MD  Polyethyl Glycol-Propyl Glycol (SYSTANE) 0.4-0.3 % GEL Place 1 drop into both eyes at bedtime.   Yes Historical Provider, MD  Polyvinyl Alcohol-Povidone (FRESHKOTE OP) Place 1 drop into both eyes 4 (four) times daily.    Yes Historical Provider, MD  potassium chloride (K-DUR,KLOR-CON) 10 MEQ tablet Take 40 mEq by mouth 3 (three) times daily.   Yes Historical Provider, MD    Physical Exam: Filed Vitals:   04/22/14 0130 04/22/14 0200 04/22/14 0230 04/22/14 0300  BP: 101/46 111/51 104/51 109/48  Pulse: 73 77 69 72  Temp:      TempSrc:      Resp: 23 20 22 19   SpO2: 96% 95% 94% 90%     General:  Poorly built and nourished.  Eyes: Anicteric normal.  ENT: No discharge from ears eyes nose mouth.  Neck: No mass felt.  Cardiovascular: S1-S2 heard.  Respiratory: No rhonchi or crepitations.  Abdomen: Soft nontender bowel sounds present. No guarding or rigidity.  Skin: No rash.  Musculoskeletal: Edema present bilaterally lower extremity.  Psychiatric: Appears normal.  Neurologic: Alert awake oriented to time place and person. Moves all extremities.  Labs on Admission:  Basic Metabolic Panel:  Recent Labs Lab 04/15/14 0445 04/16/14 0342 04/21/14 2250 04/22/14 0009  NA 143 136* 136*  --   K 2.6* 4.2 7.0* 5.4*  CL 99 94* 98  --   CO2 31 28 26   --   GLUCOSE 70 71 128*  --   BUN 24* 28* 32*  --   CREATININE 1.12* 1.14* 1.42*  --   CALCIUM 8.5 8.8 9.6  --   MG  --   --   --  2.0   Liver Function Tests: No results found for this basename: AST, ALT, ALKPHOS, BILITOT, PROT, ALBUMIN,  in the last 168 hours No results found for this basename: LIPASE, AMYLASE,  in the last 168 hours No  results found for this basename: AMMONIA,  in the last 168 hours CBC:  Recent Labs Lab 04/21/14 2250  WBC 9.9  NEUTROABS 7.3  HGB 11.5*  HCT 36.5  MCV 96.3  PLT 253   Cardiac Enzymes: No results found for this basename: CKTOTAL, CKMB, CKMBINDEX, TROPONINI,  in the last 168 hours  BNP (last 3 results)  Recent Labs  12/26/13 2133 04/13/14 2106 04/21/14 2250  PROBNP 12752.0* 21340.0* 21038.0*   CBG: No results found for this basename: GLUCAP,  in the last 168 hours  Radiological Exams on Admission: Dg Chest Port 1 View  04/21/2014   CLINICAL DATA:  Shortness of breath.  Heart disease.  Breast cancer.  EXAM: PORTABLE CHEST - 1 VIEW  COMPARISON:  04/13/2014  FINDINGS: Enlarged cardiopericardial silhouette noted with cephalization of blood flow and faint Kerley B-lines. Interstitial edema is mildly improved compared to 04/13/14.  The patient is rotated to the Right on today's radiograph, reducing diagnostic sensitivity and specificity. Dense mitral annular calcification. Fullness of the right hilum is of uncertain significance. Left axillary clips noted.  Minimal pleural thickening bilaterally, without a well-defined pleural effusion.  IMPRESSION: 1. Minimal interstitial edema, although mildly improved compared to the prior exam. Of the right basilar opacity is also improved. Cardiomegaly and atherosclerosis noted.   Electronically Signed   By: Sherryl Barters M.D.   On: 04/21/2014 23:31    EKG: Independently reviewed. Normal sinus rhythm with RBBB comparable to old EKG.  Assessment/Plan Principal Problem:   Acute CHF Active Problems:   HYPOTHYROIDISM, HX OF   Atrial fibrillation   Hyperkalemia   CHF (congestive heart failure)   UTI (lower urinary tract infection)   1. Decompensated diastolic CHF  - patient has received Lasix 40 mg IV in the ER and I have placed patient on 60 mg IV every 12. Closely follow intake output and metabolic panel and daily weights. 2. Hyperkalemia -  discontinue potassium supplements. Follow metabolic panel. 3. UTI - on ceftriaxone. Check urine culture. 4. Right posterior back pain - check T-spine x-ray for any fractures. 5. Atrial fibrillation - rate controlled. On apixaban. 6. Hypothyroidism - continue Synthroid. 7. Chronic anemia - follow CBC.  Hospice consult requested.    Code Status: DO NOT RESUSCITATE.  Family Communication: Patient's son and daughter at bedside.  Disposition Plan: Admit to inpatient.    KAKRAKANDY,ARSHAD N. [facility] Pager [phone].  If 7PM-7AM, please contact night-coverage [email or web address] Password [facility] 04/22/2014, 3:25 AM

## 2014-04-22 NOTE — Progress Notes (Signed)
[facility] PROGRESS NOTE  [name] NWG:[id number] DOB: 11-04-1917 DOA: 04/21/2014 PCP: Julia Hare, MD  Assessment/Plan: 1. Acute on chronic diastolic congestive heart failure -Most recent transthoracic echocardiogram performed at 12/25/2013 showing ejection fraction of 55-60% without wall motion abnormalities, grade 2 diastolic dysfunction. -On admission she was given 40 mg of IV Lasix. This morning she is somnolent and difficult to arouse, not eating.  -Given this changes I am concerned with giving more IV lasix with her not eating and hypotensive this morning, will monitor volume status.     2.  UTI -UA on admission showed many bacteria with presence of nitrates and leukocyte Estrace -This could be contributing to acute encephalopathy, will continue ceftriaxone 1 g IV every 24 hours -Followup on cultures  3. History of atrial fibrillation -Currently rate controlled, holding carvedilol do to the presence of hypotension  4.  Goals of Care.  -I discussed goals of care with her Son And Daughter who were both present at bedside. We went over [name]'s condition. They report she has had a significant decline over the past several months, particularly after every hospitalization. They tell me that she has not been able to walk or care for herself since her last hospitalization. They feel that she las lived a good life and that repeated hospitalizations is something that she likely would not want. They will take the day to think about options including transition to full comfort. She is a DNR   Code Status: DNR Family Communication: Family Meeting held with her son and daughter Disposition Plan: Continue supportive care  Antibiotics:  Rocephin  HPI/Subjective: Patient is a pleasant [age] female with a past medical history of chronic diastolic congestive heart failure, severe mitral regurgitation, admitted to the medicine service on 04/20/2014 presenting with  complaints of cough and shortness of breath. She presently resides at home with her daughter. Symptoms attributed to acute decompensated congestive heart failure as she was admitted her IV Lasix. This morning patient is lethargic and difficult to arouse. Son and daughter are present at bedside as we discussed goals of care.   Objective: Filed Vitals:   04/22/14 0413  BP: 113/54  Pulse: 73  Temp: 97.3 F (36.3 C)  Resp: 18    Intake/Output Summary (Last 24 hours) at 04/22/14 1126 Last data filed at 04/22/14 1100  Gross per 24 hour  Intake    520 ml  Output   1105 ml  Net   -585 ml   Filed Weights   04/22/14 0413  Weight: 43.1 kg (95 lb 0.3 oz)    Exam:   General:  Patient is obtunded difficult to arouse  Cardiovascular: Regular rate and rhythm, normal S1S2  Respiratory:  Has bibasilar crackles, does not appear to be in acute respiratory distress  Abdomen: Soft nontender nondistended  Musculoskeletal: no edema  Data Reviewed: Basic Metabolic Panel:  Recent Labs Lab 04/16/14 0342 04/21/14 2250 04/22/14 0009 04/22/14 0400  NA 136* 136*  --  140  K 4.2 7.0* 5.4* 5.2  CL 94* 98  --  102  CO2 28 26  --  26  GLUCOSE 71 128*  --  67*  BUN 28* 32*  --  29*  CREATININE 1.14* 1.42*  --  1.27*  CALCIUM 8.8 9.6  --  9.6  MG  --   --  2.0  --    Liver Function Tests:  Recent Labs Lab 04/22/14 0400  AST 33  ALT 21  ALKPHOS 83  BILITOT 0.4  PROT 7.6  ALBUMIN 2.4*   No results found for this basename: LIPASE, AMYLASE,  in the last 168 hours No results found for this basename: AMMONIA,  in the last 168 hours CBC:  Recent Labs Lab 04/21/14 2250 04/22/14 0440  WBC 9.9 8.2  NEUTROABS 7.3 5.4  HGB 11.5* 10.9*  HCT 36.5 35.3*  MCV 96.3 96.2  PLT 253 224   Cardiac Enzymes: No results found for this basename: CKTOTAL, CKMB, CKMBINDEX, TROPONINI,  in the last 168 hours BNP (last 3 results)  Recent Labs  12/26/13 2133 04/13/14 2106 04/21/14 2250  PROBNP  12752.0* 21340.0* 21038.0*   CBG: No results found for this basename: GLUCAP,  in the last 168 hours  No results found for this or any previous visit (from the past 240 hour(s)).   Studies: Dg Thoracic Spine W/swimmers  04/22/2014   CLINICAL DATA:  Mid back pain.  EXAM: THORACIC SPINE - 2 VIEW + SWIMMERS  COMPARISON:  Portable chest 04/21/2014  FINDINGS: Diffuse bone demineralization. Compression deformities of multiple mid and lower thoracic vertebra with associated thoracic kyphosis. Appearances are age indeterminate but there probably been some progression of vertebral narrowing since previous lateral chest radiograph from 09/22/2012. No anterior subluxation. Calcified and tortuous aorta. Calcification in the mitral valve annulus.  IMPRESSION: Diffuse bone demineralization with multiple thoracic compression fractures consistent with osteoporosis. Thoracic kyphosis. There has been progression since 2013.   Electronically Signed   By: Lucienne Capers M.D.   On: 04/22/2014 03:58   Dg Chest Port 1 View  04/21/2014   CLINICAL DATA:  Shortness of breath.  Heart disease.  Breast cancer.  EXAM: PORTABLE CHEST - 1 VIEW  COMPARISON:  04/13/2014  FINDINGS: Enlarged cardiopericardial silhouette noted with cephalization of blood flow and faint Kerley B-lines. Interstitial edema is mildly improved compared to 04/13/14.  The patient is rotated to the Right on today's radiograph, reducing diagnostic sensitivity and specificity. Dense mitral annular calcification. Fullness of the right hilum is of uncertain significance. Left axillary clips noted.  Minimal pleural thickening bilaterally, without a well-defined pleural effusion.  IMPRESSION: 1. Minimal interstitial edema, although mildly improved compared to the prior exam. Of the right basilar opacity is also improved. Cardiomegaly and atherosclerosis noted.   Electronically Signed   By: Sherryl Barters M.D.   On: 04/21/2014 23:31    Scheduled Meds: . amiodarone   200 mg Oral Daily  . apixaban  2.5 mg Oral BID  . carvedilol  3.125 mg Oral BID WC  . [START ON 04/23/2014] cefTRIAXone (ROCEPHIN)  IV  1 g Intravenous Q24H  . cycloSPORINE  1 drop Both Eyes BID  . polyvinyl alcohol  1 drop Both Eyes QHS  . sodium chloride  3 mL Intravenous Q12H  . sodium chloride  3 mL Intravenous Q12H   Continuous Infusions:   Principal Problem:   Acute CHF Active Problems:   HYPOTHYROIDISM, HX OF   Atrial fibrillation   Hyperkalemia   CHF (congestive heart failure)   UTI (lower urinary tract infection)    Time spent: 35 min    Kelvin Cellar  [facility] Pager [phone]. If 7PM-7AM, please contact night-coverage at [email or web address], password [facility] 04/22/2014, 11:26 AM  LOS: 1 day

## 2014-04-23 ENCOUNTER — Encounter (HOSPITAL_COMMUNITY): Payer: Medicare Other

## 2014-04-23 DIAGNOSIS — E43 Unspecified severe protein-calorie malnutrition: Secondary | ICD-10-CM

## 2014-04-23 DIAGNOSIS — I5032 Chronic diastolic (congestive) heart failure: Secondary | ICD-10-CM

## 2014-04-23 DIAGNOSIS — I059 Rheumatic mitral valve disease, unspecified: Secondary | ICD-10-CM

## 2014-04-23 DIAGNOSIS — I5033 Acute on chronic diastolic (congestive) heart failure: Principal | ICD-10-CM

## 2014-04-23 MED ORDER — ENSURE COMPLETE PO LIQD
237.0000 mL | ORAL | Status: DC
  Administered 2014-04-23 – 2014-04-25 (×2): 237 mL via ORAL

## 2014-04-23 MED ORDER — BOOST / RESOURCE BREEZE PO LIQD
1.0000 | ORAL | Status: DC
  Administered 2014-04-24 – 2014-04-26 (×3): 1 via ORAL

## 2014-04-23 MED ORDER — FUROSEMIDE 20 MG PO TABS
20.0000 mg | ORAL_TABLET | Freq: Two times a day (BID) | ORAL | Status: DC
  Administered 2014-04-23 – 2014-04-24 (×3): 20 mg via ORAL
  Filled 2014-04-23 (×5): qty 1

## 2014-04-23 NOTE — Progress Notes (Signed)
INITIAL NUTRITION ASSESSMENT  DOCUMENTATION CODES Per approved criteria  -Severe malnutrition in the context of chronic illness  Pt meets criteria for SEVERE MALNUTRITION in the context of CHRONIC ILLNESS as evidenced by 14% weight loss in less than 4 months and estimated energy intake<75% of estimated energy needs for > 1 month.  INTERVENTION: Provide Ensure Complete and Resource Breeze once daily each RD to continue to monitor  NUTRITION DIAGNOSIS: Inadequate oral intake related to poor appetite as evidenced by meal completion<30%.   Goal: Pt to meet >/= 90% of their estimated nutrition needs   Monitor:  PO intake, weight trend, labs  Reason for Assessment: Malnutrition screening Tool, score of 2  80 y.o. female  Admitting Dx: Acute CHF  ASSESSMENT: 80 y.o. female history of CHF last EF measured was 55% with severe mitral regurgitation, hypothyroidism, atrial fibrillation, chronic kidney disease, chronic anemia who was recently admitted for CHF exacerbation was brought to the ER after patient became increasingly short of breath yesterday. Pt was residing at home with her daughter, admitted to the medicine service on 04/22/2014. She presented with a steep functional decline, failure to thrive, mental status changes, complaints of shortness of breath.  Urinalysis consistent with urinary tract infection as she was started on IV ceftriaxone. On 04/23/2014 patient showed improvement, becoming more awake and alert, interactive. Now tolerating by mouth intake.   Pt seen by nutrition team during previous admission. Pt reports poor appetite and eating very little for the past 4 months. Pt's weight has been trending down. Per pt's son at bedside pt's appetite has picked back up the past 2 days and she has been eating a little better. Pt was drinking Ensure once daily PTA. Per son, pt likes chocolate Ensure and peach Resource Breeze. Weight history shows pt has lost 14% of her body weight within  the past 4 months.  Per nursing notes, pt is eating 25-30% of meals which son states is better than pt was doing PTA.   Height: Ht Readings from Last 1 Encounters:  04/22/14 4\' 7"  (1.397 m)    Weight: Wt Readings from Last 1 Encounters:  04/23/14 95 lb (43.092 kg)    Ideal Body Weight: 92 lbs  % Ideal Body Weight: 103%  Wt Readings from Last 10 Encounters:  04/23/14 95 lb (43.092 kg)  04/16/14 94 lb 8.5 oz (42.88 kg)  03/29/14 102 lb 12.8 oz (46.63 kg)  02/12/14 107 lb (48.535 kg)  01/03/14 110 lb 12.8 oz (50.259 kg)  12/31/13 110 lb 1.6 oz (49.941 kg)  12/26/13 114 lb 10.2 oz (52 kg)  12/24/13 112 lb 8 oz (51.03 kg)  10/17/13 119 lb 12.8 oz (54.341 kg)  06/20/13 111 lb 12.8 oz (50.712 kg)    Usual Body Weight: unknown  % Usual Body Weight: NA  BMI:  Body mass index is 22.08 kg/(m^2).  Estimated Nutritional Needs: Kcal: 1100-1300 Protein: 50-60 grams Fluid: 1.1-1.3 L/day  Skin: intact  Diet Order: Cardiac  EDUCATION NEEDS: -No education needs identified at this time   Intake/Output Summary (Last 24 hours) at 04/23/14 1105 Last data filed at 04/23/14 1024  Gross per 24 hour  Intake    240 ml  Output    575 ml  Net   -335 ml    Last BM: 6/21   Labs:   Recent Labs Lab 04/21/14 2250 04/22/14 0009 04/22/14 0400  NA 136*  --  140  K 7.0* 5.4* 5.2  CL 98  --  102  CO2 26  --  26  BUN 32*  --  29*  CREATININE 1.42*  --  1.27*  CALCIUM 9.6  --  9.6  MG  --  2.0  --   GLUCOSE 128*  --  67*    CBG (last 3)  No results found for this basename: GLUCAP,  in the last 72 hours  Scheduled Meds: . amiodarone  200 mg Oral Daily  . apixaban  2.5 mg Oral BID  . carvedilol  3.125 mg Oral BID WC  . cefTRIAXone (ROCEPHIN)  IV  1 g Intravenous Q24H  . cycloSPORINE  1 drop Both Eyes BID  . furosemide  20 mg Oral BID  . polyvinyl alcohol  1 drop Both Eyes QHS  . sodium chloride  3 mL Intravenous Q12H  . sodium chloride  3 mL Intravenous Q12H     Continuous Infusions:   Past Medical History  Diagnosis Date  . Coronary artery disease   . Peripheral neuropathy   . Aortic insufficiency     mild  . Cerumen impaction   . Unspecified diastolic heart failure   . Hyperlipemia   . Mitral valve prolapse     "severe" (09/22/2012)  . Mitral regurgitation     severe  . Urosepsis   . Anemia, unspecified   . Osteoporosis   . Adenocarcinoma, breast   . Irritable bladder   . Urinary incontinence   . Hypothyroidism   . H/O: whooping cough   . Hypercholesterolemia     "stopped statins ~ 2 yr ago" (09/22/2012)  . Heart murmur   . CHF (congestive heart failure)   . Shortness of breath     "basically all the time; worse w/exertion" (09/22/2012)  . Bleeding hemorrhoids ~ 2011  . Osteoarthritis     "knees, hips, hands" (09/22/2012)    Past Surgical History  Procedure Laterality Date  . Appendectomy  10/2004  . Cataract extraction w/ intraocular lens  implant, bilateral  ? 1990's  . Breast biopsy      "several on both sides; years ago; none since 1975" (09/22/2012)  . Tonsillectomy      "as a child" (09/22/2012)  . Mastectomy  ~ 1975    left breast  . Closed reduction patella fracture  ~ 1958    right     Pryor Ochoa RD, LDN Inpatient Clinical Dietitian Pager: [phone] After Hours Pager: [phone]

## 2014-04-23 NOTE — Progress Notes (Signed)
[facility] PROGRESS NOTE  [name]:[id number] DOB: 1918-05-27 DOA: 04/21/2014 PCP: Julia Hare, MD  Interim Summary Patient is a pleasant 80 year old female, residing at home with her daughter, DO NOT RESUSCITATE [place], admitted to the medicine service on 04/22/2014. She presented with a steep functional decline, failure to thrive, mental status changes, complaints of shortness of breath.  She was given IV Lasix in the emergency room. Urinalysis consistent with urinary tract infection as she was started on IV ceftriaxone. On 04/23/2014 patient showed improvement, becoming more awake and alert, interactive. Now tolerating by mouth intake.                                                                                                                                                                                                                                             Assessment/Plan: 1. Acute on chronic diastolic congestive heart failure -Most recent transthoracic echocardiogram performed at 12/25/2013 showing ejection fraction of 55-60% without wall motion abnormalities, grade 2 diastolic dysfunction. -On admission she was given 40 mg of IV Lasix.  -Improved, appears clinically compensated -Will restart lasix at 20 mg PO BID, takes 60 mg PO BID at home, but blood pressures low with SBP's in the 90's to 100's.      2.  UTI -UA on admission showed many bacteria with presence of nitrates and leukocyte Estrace -This could be contributing to acute encephalopathy, will continue ceftriaxone 1 g IV every 24 hours -Urine cultures growing greater than 100,000 CFU of E coli, susceptibility testing pending.   3. History of atrial fibrillation -Currently rate controlled, holding carvedilol do to the presence of hypotension  4.  Goals of Care.  -I discussed goals of care with her son and daughter who were both present at bedside on 04/22/2014. We went over Mrs [name]'s  condition. They report she has had a significant decline over the past several months, particularly after every hospitalization. They tell me that she has not been able to walk or care for herself since her last hospitalization. She is a DNR. Wanted to see how she responded to AB's.  Patient showing marked improvement since yesterday, family agrees with continuing current treatment and antibiotic therapy.   Code Status: DNR Family Communication: Family Meeting held with her son and daughter Disposition Plan: Continue supportive care  Antibiotics:  Rocephin  HPI/Subjective: Patient is a  [age] female with a past medical history of chronic diastolic congestive heart failure, severe mitral regurgitation, admitted to the medicine service on 04/20/2014 presenting with complaints of cough and shortness of breath. She presently resides at home with her daughter. Symptoms attributed to acute decompensated congestive heart failure as she was admitted her IV Lasix. Yesterday morning patient is lethargic and difficult to arouse. Son and daughter are present at bedside as we discussed goals of care.   Today she seems improving, able to carry conversation with me, had her breakfast, no issues. She's she's feeling better.   Objective: Filed Vitals:   04/23/14 0657  BP: 115/48  Pulse: 74  Temp: 97.8 F (36.6 C)  Resp: 18    Intake/Output Summary (Last 24 hours) at 04/23/14 0944 Last data filed at 04/23/14 0707  Gross per 24 hour  Intake      0 ml  Output    825 ml  Net   -825 ml   Filed Weights   04/22/14 0413 04/23/14 0657  Weight: 43.1 kg (95 lb 0.3 oz) 43.092 kg (95 lb)    Exam:   General:  Patient is awake, alert, cooperative, following commands  Cardiovascular: Regular rate and rhythm, normal S1S2  Respiratory:  Has bibasilar crackles, does not appear to be in acute respiratory distress  Abdomen: Soft nontender nondistended  Musculoskeletal: no edema  Data  Reviewed: Basic Metabolic Panel:  Recent Labs Lab 04/21/14 2250 04/22/14 0009 04/22/14 0400  NA 136*  --  140  K 7.0* 5.4* 5.2  CL 98  --  102  CO2 26  --  26  GLUCOSE 128*  --  67*  BUN 32*  --  29*  CREATININE 1.42*  --  1.27*  CALCIUM 9.6  --  9.6  MG  --  2.0  --    Liver Function Tests:  Recent Labs Lab 04/22/14 0400  AST 33  ALT 21  ALKPHOS 83  BILITOT 0.4  PROT 7.6  ALBUMIN 2.4*   No results found for this basename: LIPASE, AMYLASE,  in the last 168 hours No results found for this basename: AMMONIA,  in the last 168 hours CBC:  Recent Labs Lab 04/21/14 2250 04/22/14 0440  WBC 9.9 8.2  NEUTROABS 7.3 5.4  HGB 11.5* 10.9*  HCT 36.5 35.3*  MCV 96.3 96.2  PLT 253 224   Cardiac Enzymes: No results found for this basename: CKTOTAL, CKMB, CKMBINDEX, TROPONINI,  in the last 168 hours BNP (last 3 results)  Recent Labs  12/26/13 2133 04/13/14 2106 04/21/14 2250  PROBNP 12752.0* 21340.0* 21038.0*   CBG: No results found for this basename: GLUCAP,  in the last 168 hours  Recent Results (from the past 240 hour(s))  URINE CULTURE     Status: None   Collection Time    04/22/14 12:36 AM      Result Value Ref Range Status   Specimen Description URINE, RANDOM   Final   Special Requests ADDD [id number]   Final   Culture  Setup Time     Final   Value: 04/22/2014 05:03     Performed at [facility]     Final   Value: >=100,000 COLONIES/ML     Performed at [facility]   Culture     Final   Value: ESCHERICHIA COLI     Performed at [facility]   Report Status PENDING   Incomplete  Studies: Dg Thoracic Spine W/swimmers  04/22/2014   CLINICAL DATA:  Mid back pain.  EXAM: THORACIC SPINE - 2 VIEW + SWIMMERS  COMPARISON:  Portable chest 04/21/2014  FINDINGS: Diffuse bone demineralization. Compression deformities of multiple mid and lower thoracic vertebra with associated thoracic kyphosis. Appearances are age indeterminate  but there probably been some progression of vertebral narrowing since previous lateral chest radiograph from 09/22/2012. No anterior subluxation. Calcified and tortuous aorta. Calcification in the mitral valve annulus.  IMPRESSION: Diffuse bone demineralization with multiple thoracic compression fractures consistent with osteoporosis. Thoracic kyphosis. There has been progression since 2013.   Electronically Signed   By: Lucienne Capers M.D.   On: 04/22/2014 03:58   Dg Chest Port 1 View  04/21/2014   CLINICAL DATA:  Shortness of breath.  Heart disease.  Breast cancer.  EXAM: PORTABLE CHEST - 1 VIEW  COMPARISON:  04/13/2014  FINDINGS: Enlarged cardiopericardial silhouette noted with cephalization of blood flow and faint Kerley B-lines. Interstitial edema is mildly improved compared to 04/13/14.  The patient is rotated to the Right on today's radiograph, reducing diagnostic sensitivity and specificity. Dense mitral annular calcification. Fullness of the right hilum is of uncertain significance. Left axillary clips noted.  Minimal pleural thickening bilaterally, without a well-defined pleural effusion.  IMPRESSION: 1. Minimal interstitial edema, although mildly improved compared to the prior exam. Of the right basilar opacity is also improved. Cardiomegaly and atherosclerosis noted.   Electronically Signed   By: Sherryl Barters M.D.   On: 04/21/2014 23:31    Scheduled Meds: . amiodarone  200 mg Oral Daily  . apixaban  2.5 mg Oral BID  . carvedilol  3.125 mg Oral BID WC  . cefTRIAXone (ROCEPHIN)  IV  1 g Intravenous Q24H  . cycloSPORINE  1 drop Both Eyes BID  . polyvinyl alcohol  1 drop Both Eyes QHS  . sodium chloride  3 mL Intravenous Q12H  . sodium chloride  3 mL Intravenous Q12H   Continuous Infusions:   Principal Problem:   Acute CHF Active Problems:   HYPOTHYROIDISM, HX OF   Atrial fibrillation   Hyperkalemia   CHF (congestive heart failure)   UTI (lower urinary tract  infection)    Time spent: 35 min    Kelvin Cellar  [facility] Pager [phone]. If 7PM-7AM, please contact night-coverage at [email or web address], password [facility] 04/23/2014, 9:44 AM  LOS: 2 days

## 2014-04-23 NOTE — ED Provider Notes (Signed)
CSN: [id number]     Arrival date & time 04/21/14  2233 History   First MD Initiated Contact with Patient 04/21/14 2300     Chief Complaint  Patient presents with  . Shortness of Breath     (Consider location/radiation/quality/duration/timing/severity/associated sxs/prior Treatment) HPI Comments: 80 year old female with history of atrial fibrillation, heart failure failure, severe mitral regurg, lipids, kidney disease chronic, anemia presents with worsening shortness of breath for the past 2 days. This is similar to her heart failure history. Patient has minimal activity and gets severe shortness of breath and has worsening orthopnea. Patient denies fevers or productive cough. Patient follows with heart failure team. Family is currently looking into palliative care. Nothing improves her symptoms  The history is provided by the patient and a relative.    Past Medical History  Diagnosis Date  . Coronary artery disease   . Peripheral neuropathy   . Aortic insufficiency     mild  . Cerumen impaction   . Unspecified diastolic heart failure   . Hyperlipemia   . Mitral valve prolapse     "severe" (09/22/2012)  . Mitral regurgitation     severe  . Urosepsis   . Anemia, unspecified   . Osteoporosis   . Adenocarcinoma, breast   . Irritable bladder   . Urinary incontinence   . Hypothyroidism   . H/O: whooping cough   . Hypercholesterolemia     "stopped statins ~ 2 yr ago" (09/22/2012)  . Heart murmur   . CHF (congestive heart failure)   . Shortness of breath     "basically all the time; worse w/exertion" (09/22/2012)  . Bleeding hemorrhoids ~ 2011  . Osteoarthritis     "knees, hips, hands" (09/22/2012)   Past Surgical History  Procedure Laterality Date  . Appendectomy  10/2004  . Cataract extraction w/ intraocular lens  implant, bilateral  ? 1990's  . Breast biopsy      "several on both sides; years ago; none since 1975" (09/22/2012)  . Tonsillectomy      "as a child"  (09/22/2012)  . Mastectomy  ~ 1975    left breast  . Closed reduction patella fracture  ~ 1958    right    Family History  Problem Relation Age of Onset  . Heart disease Mother   . Cancer Mother     breast cancer  . Stroke Father   . Heart disease Father   . Heart disease Sister   . Alzheimer's disease Sister   . Heart disease Sister   . Heart disease Brother    History  Substance Use Topics  . Smoking status: Never Smoker   . Smokeless tobacco: Never Used  . Alcohol Use: No   OB History   Grav Para Term Preterm Abortions TAB SAB Ect Mult Living                 Review of Systems  Constitutional: Positive for fatigue. Negative for fever and chills.  HENT: Negative for congestion.   Eyes: Negative for visual disturbance.  Respiratory: Positive for shortness of breath.   Cardiovascular: Positive for chest pain (mild chest tightness anterior).  Gastrointestinal: Negative for vomiting and abdominal pain.  Genitourinary: Negative for dysuria and flank pain.  Musculoskeletal: Negative for back pain, neck pain and neck stiffness.  Skin: Negative for rash.  Neurological: Positive for weakness (Gen.) and light-headedness. Negative for headaches.      Allergies  Review of patient's allergies indicates no known allergies.  Home Medications   Prior to Admission medications   Medication Sig Start Date End Date Taking? Authorizing Provider  ALPRAZolam (XANAX) 0.25 MG tablet Take 0.25 mg by mouth 3 (three) times daily as needed for anxiety.   Yes Historical Provider, MD  amiodarone (PACERONE) 200 MG tablet Take 1 tablet (200 mg total) by mouth daily. 02/12/14  Yes Amy D Clegg, NP  apixaban (ELIQUIS) 2.5 MG TABS tablet Take 2.5 mg by mouth 2 (two) times daily. 10am and 6:30pm   Yes Historical Provider, MD  carvedilol (COREG) 3.125 MG tablet Take 1 tablet (3.125 mg total) by mouth 2 (two) times daily with a meal.   Yes Jolaine Artist, MD  cycloSPORINE (RESTASIS) 0.05 %  ophthalmic emulsion Place 1 drop into both eyes 2 (two) times daily.    Yes Historical Provider, MD  furosemide (LASIX) 40 MG tablet Take 60 mg by mouth 2 (two) times daily. 10am and 4pm   Yes Historical Provider, MD  guaiFENesin (MUCINEX) 600 MG 12 hr tablet Take 1,200 mg by mouth 2 (two) times daily.   Yes Historical Provider, MD  Multiple Vitamin (MULTIVITAMIN WITH MINERALS) TABS tablet Take 1 tablet by mouth every evening. 4pm   Yes Historical Provider, MD  Multiple Vitamins-Minerals (PRESERVISION AREDS 2) CAPS Take 1 capsule by mouth 2 (two) times daily. 10am and 6:30pm   Yes Historical Provider, MD  nitrofurantoin (MACRODANTIN) 50 MG capsule Take 50 mg by mouth daily.   Yes Historical Provider, MD  Polyethyl Glycol-Propyl Glycol (SYSTANE) 0.4-0.3 % GEL Place 1 drop into both eyes at bedtime.   Yes Historical Provider, MD  Polyvinyl Alcohol-Povidone (FRESHKOTE OP) Place 1 drop into both eyes 4 (four) times daily.    Yes Historical Provider, MD  potassium chloride (K-DUR,KLOR-CON) 10 MEQ tablet Take 40 mEq by mouth 3 (three) times daily.   Yes Historical Provider, MD   BP 95/54  Pulse 65  Temp(Src) 97 F (36.1 C) (Oral)  Resp 18  Ht 4\' 7"  (1.397 m)  Wt 95 lb 0.3 oz (43.1 kg)  BMI 22.08 kg/m2  SpO2 95% Physical Exam  Nursing note and vitals reviewed. Constitutional: She is oriented to person, place, and time. She appears well-developed and well-nourished. No distress.  HENT:  Head: Normocephalic and atraumatic.  Eyes: Right eye exhibits no discharge. Left eye exhibits no discharge.  Neck: Normal range of motion. Neck supple. No tracheal deviation present.  Cardiovascular: Normal rate and regular rhythm.   Pulmonary/Chest: She has rales.  Mild tachypnea, patient sitting up as she becomes too short of breath lying flat. Mild increased effort  Abdominal: Soft. She exhibits no distension. There is no tenderness. There is no guarding.  Musculoskeletal: She exhibits edema (mild lower  extremity bilateral). Tenderness: crackles bilateral lower and mid lung fields.  Neurological: She is alert and oriented to person, place, and time.  Skin: Skin is warm. No rash noted. There is pallor.  Psychiatric:  Cooperative, pleasant, fatigue appearance    ED Course  Procedures (including critical care time) Labs Review Labs Reviewed  PRO B NATRIURETIC PEPTIDE - Abnormal; Notable for the following:    Pro B Natriuretic peptide (BNP) 21038.0 (*)    All other components within normal limits  CBC WITH DIFFERENTIAL - Abnormal; Notable for the following:    RBC 3.79 (*)    Hemoglobin 11.5 (*)    RDW 15.8 (*)    All other components within normal limits  BASIC METABOLIC PANEL - Abnormal; Notable for the  following:    Sodium 136 (*)    Potassium 7.0 (*)    Glucose, Bld 128 (*)    BUN 32 (*)    Creatinine, Ser 1.42 (*)    GFR calc non Af Amer 31 (*)    GFR calc Af Amer 35 (*)    All other components within normal limits  POTASSIUM - Abnormal; Notable for the following:    Potassium 5.4 (*)    All other components within normal limits  URINALYSIS, ROUTINE W REFLEX MICROSCOPIC - Abnormal; Notable for the following:    APPearance CLOUDY (*)    Hgb urine dipstick MODERATE (*)    Nitrite POSITIVE (*)    Leukocytes, UA LARGE (*)    All other components within normal limits  URINE MICROSCOPIC-ADD ON - Abnormal; Notable for the following:    Squamous Epithelial / LPF FEW (*)    Bacteria, UA MANY (*)    Casts HYALINE CASTS (*)    All other components within normal limits  COMPREHENSIVE METABOLIC PANEL - Abnormal; Notable for the following:    Glucose, Bld 67 (*)    BUN 29 (*)    Creatinine, Ser 1.27 (*)    Albumin 2.4 (*)    GFR calc non Af Amer 35 (*)    GFR calc Af Amer 41 (*)    All other components within normal limits  CBC WITH DIFFERENTIAL - Abnormal; Notable for the following:    RBC 3.67 (*)    Hemoglobin 10.9 (*)    HCT 35.3 (*)    RDW 15.7 (*)    All other  components within normal limits  URINE CULTURE  MAGNESIUM  I-STAT TROPOININ, ED  Randolm Idol, ED    Imaging Review Dg Thoracic Spine W/swimmers  04/22/2014   CLINICAL DATA:  Mid back pain.  EXAM: THORACIC SPINE - 2 VIEW + SWIMMERS  COMPARISON:  Portable chest 04/21/2014  FINDINGS: Diffuse bone demineralization. Compression deformities of multiple mid and lower thoracic vertebra with associated thoracic kyphosis. Appearances are age indeterminate but there probably been some progression of vertebral narrowing since previous lateral chest radiograph from 09/22/2012. No anterior subluxation. Calcified and tortuous aorta. Calcification in the mitral valve annulus.  IMPRESSION: Diffuse bone demineralization with multiple thoracic compression fractures consistent with osteoporosis. Thoracic kyphosis. There has been progression since 2013.   Electronically Signed   By: Lucienne Capers M.D.   On: 04/22/2014 03:58   Dg Chest Port 1 View  04/21/2014   CLINICAL DATA:  Shortness of breath.  Heart disease.  Breast cancer.  EXAM: PORTABLE CHEST - 1 VIEW  COMPARISON:  04/13/2014  FINDINGS: Enlarged cardiopericardial silhouette noted with cephalization of blood flow and faint Kerley B-lines. Interstitial edema is mildly improved compared to 04/13/14.  The patient is rotated to the Right on today's radiograph, reducing diagnostic sensitivity and specificity. Dense mitral annular calcification. Fullness of the right hilum is of uncertain significance. Left axillary clips noted.  Minimal pleural thickening bilaterally, without a well-defined pleural effusion.  IMPRESSION: 1. Minimal interstitial edema, although mildly improved compared to the prior exam. Of the right basilar opacity is also improved. Cardiomegaly and atherosclerosis noted.   Electronically Signed   By: Sherryl Barters M.D.   On: 04/21/2014 23:31     EKG Interpretation   Date/Time:  Sunday April 21 2014 22:43:51 EDT Ventricular Rate:  58 PR  Interval:  467 QRS Duration: 175 QT Interval:  499 QTC Calculation: 490 R Axis:   141 Text  Interpretation:  Sinus rhythm Prolonged PR interval Right bundle  branch block T waves peaked versus previous, qrs widening Confirmed by  ZAVITZ  MD, JOSHUA ([id number]) on 04/22/2014 12:06:54 AM      Repeat EKG overall similar previous with mild improvement in QRS widening, heart rate 77, right bundle-branch block, nonspecific ST changes, prolonged QT MDM   Final diagnoses:  UTI (lower urinary tract infection)  Acute systolic congestive heart failure  Severe mitral regurgitation  Acute dyspnea  Hyperkalemia  Anemia, chronic disease  Acute renal failure, unspecified acute renal failure type   Patient with significant cardiac history and presents with worsening heart failure symptoms. Urinalysis also showed urine infection, Rocephin ordered. EKG showed mild peaked T waves and mild worsening widening compared to previous EKG. Potassium returned elevated at 7. Clinically and chest x-ray showed pulmonary edema. Hyperkalemia treatment done including insulin and glucose, nebulizer, Lasix, calcium. Multiple rechecks in the ER patient improved gradually and spoke with [facility] for admission.  Repeat EKG showed improved  mild narrowing of QRS.  The patients results and plan were reviewed and discussed.   Any x-rays performed were personally reviewed by myself.   Differential diagnosis were considered with the presenting HPI.  Medications  ALPRAZolam (XANAX) tablet 0.25 mg (0.25 mg Oral Given 04/22/14 2105)  amiodarone (PACERONE) tablet 200 mg (200 mg Oral Given 04/22/14 1126)  carvedilol (COREG) tablet 3.125 mg (3.125 mg Oral Not Given 04/22/14 1836)  apixaban (ELIQUIS) tablet 2.5 mg (2.5 mg Oral Given 04/22/14 2105)  cycloSPORINE (RESTASIS) 0.05 % ophthalmic emulsion 1 drop (1 drop Both Eyes Given 04/22/14 2112)  acetaminophen (TYLENOL) tablet 650 mg (not administered)    Or  acetaminophen (TYLENOL)  suppository 650 mg (not administered)  ondansetron (ZOFRAN) tablet 4 mg (not administered)    Or  ondansetron (ZOFRAN) injection 4 mg (not administered)  sodium chloride 0.9 % injection 3 mL (3 mLs Intravenous Not Given 04/22/14 2200)  sodium chloride 0.9 % injection 3 mL (3 mLs Intravenous Given 04/22/14 2112)  polyvinyl alcohol (LIQUIFILM TEARS) 1.4 % ophthalmic solution 1 drop (1 drop Both Eyes Given 04/22/14 2105)  cefTRIAXone (ROCEPHIN) 1 g in dextrose 5 % 50 mL IVPB (not administered)  furosemide (LASIX) injection 40 mg (40 mg Intravenous Given 04/22/14 0033)  calcium gluconate 1 g in sodium chloride 0.9 % 100 mL IVPB (0 g Intravenous Stopped 04/22/14 0122)  insulin aspart (novoLOG) injection 8 Units (8 Units Intravenous Given 04/22/14 0033)  dextrose 50 % solution 50 mL (50 mLs Intravenous Given 04/22/14 0033)  albuterol (PROVENTIL) (2.5 MG/3ML) 0.083% nebulizer solution 5 mg (5 mg Nebulization Given 04/22/14 0019)  LORazepam (ATIVAN) injection 0.5 mg (0.5 mg Intravenous Given 04/22/14 0122)  cefTRIAXone (ROCEPHIN) 1 g in dextrose 5 % 50 mL IVPB (0 g Intravenous Stopped 04/22/14 0234)     Filed Vitals:   04/22/14 0413 04/22/14 1127 04/22/14 1836 04/22/14 2045  BP: 113/54 92/42 98/42  95/54  Pulse: 73 62 68 65  Temp: 97.3 F (36.3 C)   97 F (36.1 C)  TempSrc: Oral   Oral  Resp: 18   18  Height: 4\' 7"  (1.397 m)     Weight: 95 lb 0.3 oz (43.1 kg)     SpO2: 92%   95%    Admission/ observation were discussed with the admitting physician, patient and/or family and they are comfortable with the plan.      Mariea Clonts, MD 04/23/14 [phone]

## 2014-04-24 DIAGNOSIS — I5031 Acute diastolic (congestive) heart failure: Secondary | ICD-10-CM

## 2014-04-24 DIAGNOSIS — A419 Sepsis, unspecified organism: Secondary | ICD-10-CM

## 2014-04-24 DIAGNOSIS — A4151 Sepsis due to Escherichia coli [E. coli]: Secondary | ICD-10-CM

## 2014-04-24 LAB — URINE CULTURE: Colony Count: >=100000

## 2014-04-24 MED ORDER — POLYETHYLENE GLYCOL 3350 17 G PO PACK
17.0000 g | PACK | Freq: Every day | ORAL | Status: DC
  Administered 2014-04-24 – 2014-04-26 (×3): 17 g via ORAL
  Filled 2014-04-24 (×3): qty 1

## 2014-04-24 MED ORDER — FUROSEMIDE 10 MG/ML IJ SOLN
60.0000 mg | Freq: Two times a day (BID) | INTRAMUSCULAR | Status: DC
  Administered 2014-04-24 – 2014-04-25 (×3): 60 mg via INTRAVENOUS
  Filled 2014-04-24 (×4): qty 6

## 2014-04-24 MED ORDER — FUROSEMIDE 10 MG/ML IJ SOLN: 40.0000 mg | Freq: Two times a day (BID) | INTRAMUSCULAR | Status: DC

## 2014-04-24 MED ORDER — CIPROFLOXACIN HCL 500 MG PO TABS
500.0000 mg | ORAL_TABLET | Freq: Two times a day (BID) | ORAL | Status: DC
  Administered 2014-04-24 – 2014-04-25 (×2): 500 mg via ORAL
  Filled 2014-04-24 (×4): qty 1

## 2014-04-24 NOTE — Care Management Note (Unsigned)
    Page 1 of 1   04/24/2014     2:43:52 PM CARE MANAGEMENT NOTE 04/24/2014  Patient:  [name], [name]   Account Number:  [id number]  Date Initiated:  04/24/2014  Documentation initiated by:  Lorne Skeens  Subjective/Objective Assessment:   Patient was admitted with acute CHF.   Lives at home with children.  Is active with [facility] for RN, HHA     Action/Plan:   Will follow for discharge needs.   Anticipated DC Date:     Anticipated DC Plan:           Choice offered to / List presented to:             Status of service:  In process, will continue to follow Medicare Important Message given?  YES (If response is "NO", the following Medicare IM given date fields will be blank) Date Medicare IM given:  04/24/2014 Date Additional Medicare IM given:    Discharge Disposition:    Per UR Regulation:    If discussed at Long Length of Stay Meetings, dates discussed:    Comments:

## 2014-04-24 NOTE — Progress Notes (Signed)
[facility] PROGRESS NOTE  Filed Weights   04/22/14 0413 04/23/14 0657 04/24/14 0516  Weight: 43.1 kg (95 lb 0.3 oz) 43.092 kg (95 lb) 43.999 kg (97 lb)        Intake/Output Summary (Last 24 hours) at 04/24/14 1255 Last data filed at 04/24/14 1207  Gross per 24 hour  Intake    323 ml  Output    650 ml  Net   -327 ml     Assessment/Plan: Acute diastolic CHF (congestive heart failure)/Cardiorenal syndrome: - cont IV lasix, weight is up from previous admission. - monitor cr as it cont to to trend down. - monitor electrolytes. - daily weight  Sepsis due to UTI (lower urinary tract infection) - UC grew E. Coli sensitive to Rocephin and cipro. - change to oral cipro.    Hyperkalemia: - improving with diuresis.  Atrial fibrillation: -    HYPOTHYROIDISM, HX OF  Goals of Care.  - . They tell me that she has not been able to walk or care for herself since her last hospitalization. She is a DNR. Wanted to see how she responded to AB's.   Code Status: DNR  Family Communication: Family Meeting held with her son and daughter  Disposition Plan: Continue supportive care   Consultants:  none  Procedures: ECHO: none  Antibiotics:  Rocephin 6.22.2015-6.24  cipro 6.24.2015 (indicate start date, and stop date if known)  HPI/Subjective: Feels slightly better  Objective: Filed Vitals:   04/23/14 1401 04/23/14 2103 04/24/14 0516 04/24/14 0858  BP: 106/50 99/42 92/40  118/52  Pulse: 70 70 68 72  Temp: 97.7 F (36.5 C) 97.8 F (36.6 C) 98.1 F (36.7 C)   TempSrc: Oral Oral Oral   Resp: 18 20 18    Height:      Weight:   43.999 kg (97 lb)   SpO2: 95% 95% 96%      Exam:  General: Alert, awake, oriented x3, in no acute distress.  HEENT: No bruits, no goiter. -JVD Heart: Regular rate and rhythm, without murmurs, rubs, gallops.  Lungs: Good air movement,Clear Abdomen: Soft, nontender, nondistended, positive bowel sounds.    Data Reviewed: Basic  Metabolic Panel:  Recent Labs Lab 04/21/14 2250 04/22/14 0009 04/22/14 0400  NA 136*  --  140  K 7.0* 5.4* 5.2  CL 98  --  102  CO2 26  --  26  GLUCOSE 128*  --  67*  BUN 32*  --  29*  CREATININE 1.42*  --  1.27*  CALCIUM 9.6  --  9.6  MG  --  2.0  --    Liver Function Tests:  Recent Labs Lab 04/22/14 0400  AST 33  ALT 21  ALKPHOS 83  BILITOT 0.4  PROT 7.6  ALBUMIN 2.4*   No results found for this basename: LIPASE, AMYLASE,  in the last 168 hours No results found for this basename: AMMONIA,  in the last 168 hours CBC:  Recent Labs Lab 04/21/14 2250 04/22/14 0440  WBC 9.9 8.2  NEUTROABS 7.3 5.4  HGB 11.5* 10.9*  HCT 36.5 35.3*  MCV 96.3 96.2  PLT 253 224   Cardiac Enzymes: No results found for this basename: CKTOTAL, CKMB, CKMBINDEX, TROPONINI,  in the last 168 hours BNP (last 3 results)  Recent Labs  12/26/13 2133 04/13/14 2106 04/21/14 2250  PROBNP 12752.0* 21340.0* 21038.0*   CBG: No results found for this basename: GLUCAP,  in the last 168 hours  Recent Results (from the past 240  hour(s))  URINE CULTURE     Status: None   Collection Time    04/22/14 12:36 AM      Result Value Ref Range Status   Specimen Description URINE, RANDOM   Final   Special Requests ADDD [phone]   Final   Culture  Setup Time     Final   Value: 04/22/2014 05:03     Performed at [place]     Final   Value: >=100,000 COLONIES/ML     Performed at [facility]   Culture     Final   Value: ESCHERICHIA COLI     Performed at [facility]   Report Status 04/24/2014 FINAL   Final   Organism ID, Bacteria ESCHERICHIA COLI   Final     Studies: No results found.  Scheduled Meds: . amiodarone  200 mg Oral Daily  . apixaban  2.5 mg Oral BID  . carvedilol  3.125 mg Oral BID WC  . cefTRIAXone (ROCEPHIN)  IV  1 g Intravenous Q24H  . cycloSPORINE  1 drop Both Eyes BID  . feeding supplement (ENSURE COMPLETE)  237 mL Oral Q24H  .  feeding supplement (RESOURCE BREEZE)  1 Container Oral Q24H  . furosemide  40 mg Intravenous BID  . polyvinyl alcohol  1 drop Both Eyes QHS  . sodium chloride  3 mL Intravenous Q12H  . sodium chloride  3 mL Intravenous Q12H   Continuous Infusions:    Charlynne Cousins  [facility] Pager [phone] If 8PM-8AM, please contact night-coverage at [email or web address], password [email or web address], 12:55 PM  LOS: 3 days     **Disclaimer: This note may have been dictated with voice recognition software. Similar sounding words can inadvertently be transcribed and this note may contain transcription errors which may not have been corrected upon publication of note.**

## 2014-04-25 DIAGNOSIS — N184 Chronic kidney disease, stage 4 (severe): Secondary | ICD-10-CM

## 2014-04-25 LAB — TROPONIN I: Troponin I: <0.3 ng/mL (ref <0.30)

## 2014-04-25 MED ORDER — CIPROFLOXACIN HCL 500 MG PO TABS
500.0000 mg | ORAL_TABLET | Freq: Every day | ORAL | Status: DC
  Administered 2014-04-26: 500 mg via ORAL
  Filled 2014-04-25: qty 1

## 2014-04-25 MED ORDER — SORBITOL 70 % SOLN
30.0000 mL | Freq: Every day | Status: DC | PRN
  Filled 2014-04-25: qty 30

## 2014-04-25 NOTE — Progress Notes (Signed)
[facility] PROGRESS NOTE  Filed Weights   04/23/14 0657 04/24/14 0516 04/25/14 0612  Weight: 43.092 kg (95 lb) 43.999 kg (97 lb) 41.958 kg (92 lb 8 oz)        Intake/Output Summary (Last 24 hours) at 04/25/14 0956 Last data filed at 04/25/14 0934  Gross per 24 hour  Intake    363 ml  Output   1351 ml  Net   -988 ml     Assessment/Plan: Acute diastolic CHF (congestive heart failure)/Cardiorenal syndrome: - cont IV lasix.b-met pending. - monitor cr as it cont to to trend down. - monitor electrolytes. - daily weight  Sepsis due to UTI (lower urinary tract infection) - UC grew E. Coli sensitive to Rocephin and cipro. - change to oral cipro.    Hyperkalemia: - Improving with diuresis. - b-met pending.  Atrial fibrillation: - rate controlled. Cont amio, coreg  and eliquis    HYPOTHYROIDISM, HX OF - cont synthroid.  Goals of Care.  - . They tell me that she has not been able to walk or care for herself since her last hospitalization. She is a DNR. Wanted to see how she responded to AB's.   Code Status: DNR  Family Communication: Family Meeting held with her son and daughter  Disposition Plan: Continue supportive care   Consultants:  none  Procedures: ECHO: none  Antibiotics:  Rocephin 6.22.2015-6.24  cipro 6.24.2015 (indicate start date, and stop date if known)  HPI/Subjective: Feels  better  Objective: Filed Vitals:   04/24/14 1846 04/24/14 2234 04/25/14 0612 04/25/14 0900  BP: 98/42 118/48 97/40 102/42  Pulse:  72 63 68  Temp:  98.1 F (36.7 C) 97.7 F (36.5 C)   TempSrc:  Oral Oral   Resp:  16 16   Height:      Weight:   41.958 kg (92 lb 8 oz)   SpO2:  95% 96%      Exam:  General: Alert, awake, oriented x3, in no acute distress.  HEENT: No bruits, no goiter. -JVD Heart: Regular rate and rhythm, without murmurs, rubs, gallops.  Lungs: Good air movement,Clear Abdomen: Soft, nontender, nondistended, positive bowel sounds.     Data Reviewed: Basic Metabolic Panel:  Recent Labs Lab 04/21/14 2250 04/22/14 0009 04/22/14 0400  NA 136*  --  140  K 7.0* 5.4* 5.2  CL 98  --  102  CO2 26  --  26  GLUCOSE 128*  --  67*  BUN 32*  --  29*  CREATININE 1.42*  --  1.27*  CALCIUM 9.6  --  9.6  MG  --  2.0  --    Liver Function Tests:  Recent Labs Lab 04/22/14 0400  AST 33  ALT 21  ALKPHOS 83  BILITOT 0.4  PROT 7.6  ALBUMIN 2.4*   No results found for this basename: LIPASE, AMYLASE,  in the last 168 hours No results found for this basename: AMMONIA,  in the last 168 hours CBC:  Recent Labs Lab 04/21/14 2250 04/22/14 0440  WBC 9.9 8.2  NEUTROABS 7.3 5.4  HGB 11.5* 10.9*  HCT 36.5 35.3*  MCV 96.3 96.2  PLT 253 224   Cardiac Enzymes: No results found for this basename: CKTOTAL, CKMB, CKMBINDEX, TROPONINI,  in the last 168 hours BNP (last 3 results)  Recent Labs  12/26/13 2133 04/13/14 2106 04/21/14 2250  PROBNP 12752.0* 21340.0* 21038.0*   CBG: No results found for this basename: GLUCAP,  in the last 168 hours  Recent Results (from the past 240 hour(s))  URINE CULTURE     Status: None   Collection Time    04/22/14 12:36 AM      Result Value Ref Range Status   Specimen Description URINE, RANDOM   Final   Special Requests ADDD [id number]   Final   Culture  Setup Time     Final   Value: 04/22/2014 05:03     Performed at [place]     Final   Value: >=100,000 COLONIES/ML     Performed at [facility]   Culture     Final   Value: ESCHERICHIA COLI     Performed at [facility]   Report Status 04/24/2014 FINAL   Final   Organism ID, Bacteria ESCHERICHIA COLI   Final     Studies: No results found.  Scheduled Meds: . amiodarone  200 mg Oral Daily  . apixaban  2.5 mg Oral BID  . carvedilol  3.125 mg Oral BID WC  . ciprofloxacin  500 mg Oral BID  . cycloSPORINE  1 drop Both Eyes BID  . feeding supplement (ENSURE COMPLETE)  237 mL Oral Q24H   . feeding supplement (RESOURCE BREEZE)  1 Container Oral Q24H  . furosemide  60 mg Intravenous BID  . polyethylene glycol  17 g Oral Daily  . polyvinyl alcohol  1 drop Both Eyes QHS  . sodium chloride  3 mL Intravenous Q12H  . sodium chloride  3 mL Intravenous Q12H   Continuous Infusions:    Charlynne Cousins  [facility] Pager [phone] If 8PM-8AM, please contact night-coverage at [email or web address], password [email or web address], 9:56 AM  LOS: 4 days     **Disclaimer: This note may have been dictated with voice recognition software. Similar sounding words can inadvertently be transcribed and this note may contain transcription errors which may not have been corrected upon publication of note.**

## 2014-04-25 NOTE — Progress Notes (Signed)
UR complete.  Courtney Robarge RN, MSN 

## 2014-04-26 DIAGNOSIS — R0902 Hypoxemia: Secondary | ICD-10-CM

## 2014-04-26 DIAGNOSIS — J9620 Acute and chronic respiratory failure, unspecified whether with hypoxia or hypercapnia: Secondary | ICD-10-CM

## 2014-04-26 LAB — BASIC METABOLIC PANEL
BUN: 22 mg/dL (ref 6–23)
CO2: 33 mEq/L — ABNORMAL HIGH (ref 19–32)
CREATININE: 1.15 mg/dL — AB (ref 0.50–1.10)
Calcium: 8.7 mg/dL (ref 8.4–10.5)
Chloride: 90 mEq/L — ABNORMAL LOW (ref 96–112)
GFR calc non Af Amer: 39 mL/min — ABNORMAL LOW (ref >90)
GFR, EST AFRICAN AMERICAN: 46 mL/min — AB (ref >90)
Glucose, Bld: 95 mg/dL (ref 70–99)
POTASSIUM: 3.1 meq/L — AB (ref 3.7–5.3)
Sodium: 136 mEq/L — ABNORMAL LOW (ref 137–147)

## 2014-04-26 LAB — CBC
HEMATOCRIT: 35.5 % — AB (ref 36.0–46.0)
Hemoglobin: 11 g/dL — ABNORMAL LOW (ref 12.0–15.0)
MCH: 29 pg (ref 26.0–34.0)
MCHC: 31 g/dL (ref 30.0–36.0)
MCV: 93.7 fL (ref 78.0–100.0)
PLATELETS: 205 10*3/uL (ref 150–400)
RBC: 3.79 MIL/uL — ABNORMAL LOW (ref 3.87–5.11)
RDW: 15.3 % (ref 11.5–15.5)
WBC: 7.3 10*3/uL (ref 4.0–10.5)

## 2014-04-26 MED ORDER — POTASSIUM CHLORIDE CRYS ER 20 MEQ PO TBCR
40.0000 meq | EXTENDED_RELEASE_TABLET | Freq: Once | ORAL | Status: AC
  Administered 2014-04-26: 40 meq via ORAL
  Filled 2014-04-26: qty 2

## 2014-04-26 MED ORDER — CIPROFLOXACIN HCL 500 MG PO TABS: 500.0000 mg | ORAL_TABLET | Freq: Every day | ORAL | Status: DC

## 2014-04-26 MED ORDER — POTASSIUM CHLORIDE CRYS ER 10 MEQ PO TBCR: 40.0000 meq | EXTENDED_RELEASE_TABLET | Freq: Once | ORAL | Status: DC

## 2014-04-26 MED ORDER — FUROSEMIDE 40 MG PO TABS
60.0000 mg | ORAL_TABLET | Freq: Two times a day (BID) | ORAL | Status: DC
  Administered 2014-04-26: 60 mg via ORAL
  Filled 2014-04-26 (×3): qty 1

## 2014-04-26 NOTE — Progress Notes (Addendum)
Patient discharged to home with family, transported via [facility] on 3L oxygen.  IV removed, IV site clean, dry, and intact.  Discharge instructions, education, and medications discussed with patient and family prior to discharge; all voice understanding of information.

## 2014-04-26 NOTE — Discharge Summary (Addendum)
Physician Discharge Summary  [name] IZT:[id number] DOB: 06/10/1918 DOA: 04/21/2014  PCP: Adella Hare, MD  Admit date: 04/21/2014 Discharge date: 04/26/2014  Time spent: 45 minutes  Recommendations for Outpatient Follow-up:  1. Follow up with heart failure clininc in 1 week. 2. PCP in 4 week recheck TSh and free t4.  BNP    Component Value Date/Time   PROBNP 21038.0* 04/21/2014 2250   Filed Weights   04/24/14 0516 04/25/14 0612 04/26/14 0548  Weight: 43.999 kg (97 lb) 41.958 kg (92 lb 8 oz) 42.729 kg (94 lb 3.2 oz)     Discharge Diagnoses:  Principal Problem:   Acute diastolic CHF (congestive heart failure) Active Problems:   HYPOTHYROIDISM, HX OF   Atrial fibrillation   Hyperkalemia   UTI (lower urinary tract infection)   Sepsis   Discharge Condition: guarded  Diet recommendation: low sodium   History of present illness:  80 y.o. female history of CHF last EF measured was 55% with severe mitral regurgitation, hypothyroidism, atrial fibrillation, chronic kidney disease, chronic anemia who was recently admitted for CHF exacerbation was brought to the ER after patient became increasingly short of breath yesterday. On exam patient also has lower extremity edema and was complaining of right lower chest pain. Labs reveal increased potassium and was given Lasix calcium albuterol D50 and insulin. Repeat potassium levels have come back to around 5.4 from 7. Chest x-ray shows congestion. UA shows features concerning for UTI. Patient has chronic cough. Patient otherwise afebrile has no nausea vomiting abdominal pain diarrhea patient's right sided back pain has improved at this time.    Hospital Course:  Acute diastolic CHF (congestive heart failure)/Cardiorenal syndrome:  - Started on IV lasix. Once to dry weight change to orals. Most likely due to sepsis. - Monitored cr. - Daily weight and strict I and O's at home. - Follow up with heart failure clinic in 1  week.  AKI/Cardiorenal syndrome: - Improved with diuresis.  Sepsis due to UTI (lower urinary tract infection)  - UC grew E. Coli sensitive to Rocephin and cipro.  - change to oral cipro.   Hyperkalemia:  - Improving with diuresis.  - b-met pending.   Atrial fibrillation:  - rate controlled. Cont amio, coreg and eliquis   HYPOTHYROIDISM, HX OF  - cont synthroid.  Procedures:  Thoracic x-ray  Consultations:  None  Discharge Exam: Filed Vitals:   04/26/14 1139  BP: 101/45  Pulse: 65  Temp:   Resp: 18    General: A&O x3 Cardiovascular: RRR Respiratory: good air movement CTA B/L  Discharge Instructions      Discharge Instructions   Diet - low sodium heart healthy    Complete by:  As directed      Increase activity slowly    Complete by:  As directed             Medication List    STOP taking these medications       nitrofurantoin 50 MG capsule  Commonly known as:  MACRODANTIN      TAKE these medications       ALPRAZolam 0.25 MG tablet  Commonly known as:  XANAX  Take 0.25 mg by mouth 3 (three) times daily as needed for anxiety.     amiodarone 200 MG tablet  Commonly known as:  PACERONE  Take 1 tablet (200 mg total) by mouth daily.     carvedilol 3.125 MG tablet  Commonly known as:  COREG  Take 1 tablet (  3.125 mg total) by mouth 2 (two) times daily with a meal.     ciprofloxacin 500 MG tablet  Commonly known as:  CIPRO  Take 1 tablet (500 mg total) by mouth daily.     cycloSPORINE 0.05 % ophthalmic emulsion  Commonly known as:  RESTASIS  Place 1 drop into both eyes 2 (two) times daily.     ELIQUIS 2.5 MG Tabs tablet  Generic drug:  apixaban  Take 2.5 mg by mouth 2 (two) times daily. 10am and 6:30pm     FRESHKOTE OP  Place 1 drop into both eyes 4 (four) times daily.     furosemide 40 MG tablet  Commonly known as:  LASIX  Take 60 mg by mouth 2 (two) times daily. 10am and 4pm     guaiFENesin 600 MG 12 hr tablet  Commonly known as:   MUCINEX  Take 1,200 mg by mouth 2 (two) times daily.     multivitamin with minerals Tabs tablet  Take 1 tablet by mouth every evening. 4pm     potassium chloride 10 MEQ tablet  Commonly known as:  K-DUR,KLOR-CON  Take 4 tablets (40 mEq total) by mouth once.     PRESERVISION AREDS 2 Caps  Take 1 capsule by mouth 2 (two) times daily. 10am and 6:30pm     SYSTANE 0.4-0.3 % Gel  Generic drug:  Polyethyl Glycol-Propyl Glycol  Place 1 drop into both eyes at bedtime.       No Known Allergies Follow-up Information   Follow up with CLEGG,AMY, NP On 04/29/2014. (at 3:15pm in the [facility])    Specialty:  Nurse Practitioner   Contact information:   [address] [place] [address] [phone]        The results of significant diagnostics from this hospitalization (including imaging, microbiology, ancillary and laboratory) are listed below for reference.    Significant Diagnostic Studies: Dg Thoracic Spine W/swimmers  04/22/2014   CLINICAL DATA:  Mid back pain.  EXAM: THORACIC SPINE - 2 VIEW + SWIMMERS  COMPARISON:  Portable chest 04/21/2014  FINDINGS: Diffuse bone demineralization. Compression deformities of multiple mid and lower thoracic vertebra with associated thoracic kyphosis. Appearances are age indeterminate but there probably been some progression of vertebral narrowing since previous lateral chest radiograph from 09/22/2012. No anterior subluxation. Calcified and tortuous aorta. Calcification in the mitral valve annulus.  IMPRESSION: Diffuse bone demineralization with multiple thoracic compression fractures consistent with osteoporosis. Thoracic kyphosis. There has been progression since 2013.   Electronically Signed   By: Lucienne Capers M.D.   On: 04/22/2014 03:58   Dg Chest Port 1 View  04/21/2014   CLINICAL DATA:  Shortness of breath.  Heart disease.  Breast cancer.  EXAM: PORTABLE CHEST - 1 VIEW  COMPARISON:  04/13/2014  FINDINGS: Enlarged  cardiopericardial silhouette noted with cephalization of blood flow and faint Kerley B-lines. Interstitial edema is mildly improved compared to 04/13/14.  The patient is rotated to the Right on today's radiograph, reducing diagnostic sensitivity and specificity. Dense mitral annular calcification. Fullness of the right hilum is of uncertain significance. Left axillary clips noted.  Minimal pleural thickening bilaterally, without a well-defined pleural effusion.  IMPRESSION: 1. Minimal interstitial edema, although mildly improved compared to the prior exam. Of the right basilar opacity is also improved. Cardiomegaly and atherosclerosis noted.   Electronically Signed   By: Sherryl Barters M.D.   On: 04/21/2014 23:31   Dg Chest Portable 1 View  04/13/2014  CLINICAL DATA:  Shortness of breath.  EXAM: PORTABLE CHEST - 1 VIEW  COMPARISON:  Prior radiograph from 12/26/2013.  FINDINGS: Cardiomegaly is stable as compared to prior exam. Tortuosity of the intrathoracic aorta noted. Calcifications present within the aortic arch. Surgical clips overlie the left axilla.  Lungs are hypoinflated with chronic coarsening of the interstitial markings, similar to prior. Patchy opacity present within the medial right lung base, which may reflect atelectasis and/ bronchovascular crowding. Possible infectious infiltrate could be considered in the correct clinical setting. Blunting of the bilateral costophrenic angles is suggestive of tiny bilateral pleural effusions. No overt pulmonary edema. There is no pneumothorax.  Diffuse osteopenia noted.  No acute osseous abnormality.  IMPRESSION: 1. Shallow lung inflation with patchy opacity within the medial right lung base. Findings may reflect atelectasis and/ bronchovascular crowding related to hypoinflation. Possible infectious infiltrate could be considered in the correct clinical setting. 2. Probable small bilateral pleural effusions. 3. Stable cardiomegaly without overt pulmonary  edema.   Electronically Signed   By: Jeannine Boga M.D.   On: 04/13/2014 21:29    Microbiology: Recent Results (from the past 240 hour(s))  URINE CULTURE     Status: None   Collection Time    04/22/14 12:36 AM      Result Value Ref Range Status   Specimen Description URINE, RANDOM   Final   Special Requests ADDD [id number]   Final   Culture  Setup Time     Final   Value: 04/22/2014 05:03     Performed at [facility]     Final   Value: >=100,000 COLONIES/ML     Performed at [facility]   Culture     Final   Value: ESCHERICHIA COLI     Performed at [facility]   Report Status 04/24/2014 FINAL   Final   Organism ID, Bacteria ESCHERICHIA COLI   Final     Labs: Basic Metabolic Panel:  Recent Labs Lab 04/21/14 2250 04/22/14 0009 04/22/14 0400 04/26/14 0450  NA 136*  --  140 136*  K 7.0* 5.4* 5.2 3.1*  CL 98  --  102 90*  CO2 26  --  26 33*  GLUCOSE 128*  --  67* 95  BUN 32*  --  29* 22  CREATININE 1.42*  --  1.27* 1.15*  CALCIUM 9.6  --  9.6 8.7  MG  --  2.0  --   --    Liver Function Tests:  Recent Labs Lab 04/22/14 0400  AST 33  ALT 21  ALKPHOS 83  BILITOT 0.4  PROT 7.6  ALBUMIN 2.4*   No results found for this basename: LIPASE, AMYLASE,  in the last 168 hours No results found for this basename: AMMONIA,  in the last 168 hours CBC:  Recent Labs Lab 04/21/14 2250 04/22/14 0440 04/26/14 0450  WBC 9.9 8.2 7.3  NEUTROABS 7.3 5.4  --   HGB 11.5* 10.9* 11.0*  HCT 36.5 35.3* 35.5*  MCV 96.3 96.2 93.7  PLT 253 224 205   Cardiac Enzymes:  Recent Labs Lab 04/25/14 2150  TROPONINI <0.30   BNP: BNP (last 3 results)  Recent Labs  12/26/13 2133 04/13/14 2106 04/21/14 2250  PROBNP 12752.0* 21340.0* 21038.0*   CBG: No results found for this basename: GLUCAP,  in the last 168 hours     Signed:  Charlynne Cousins  [facility] 04/26/2014, 1:59 PM   **Disclaimer: This note may have  been  dictated with voice recognition software. Similar sounding words can inadvertently be transcribed and this note may contain transcription errors which may not have been corrected upon publication of note.**

## 2014-04-27 NOTE — Progress Notes (Signed)
CSW met with patient's son Dominica Severin along with [place] and Octavia Bruckner- [facility] to discuss patient's d/c needs and family's concerns.  When patient's daughter [name] arrived at the hospital, the family requested another meeting with CSW and [facility].  A thorough discussion was held with family regarding home health vs hospice as well as patient's wishes. Family was adamant that their mother had expressed many times in the past that she would not want to be "kept alive" if she was very sick.  Family wishes to honor her wishes but are struggling with "letting her go."  Benefits of home health vs hospice care was discussed as well as private duty care services. CSW asked RNCM to provide a list of private duty services to the family.  Per MD- patient will d/c home today and has follow up appointment with MD at the Heart Failure clinic on Monday 04/29/14.  After talking with the HF MD- family will decide if they wish to continue home health vs embracing Hospice.  Daughter was very tearful and CSW encouraged both son and daughter to talk about their mother and past life experiences.  Tim with [facility] offered support and stated that [facility] would provide ongoing case management support and services to the family and patient once home as they continue to consider care options for patient.  Family requested EMS transport home and CSW arranged. CSW signing off but will be available if needed. Lorie Phenix. [place], [place]

## 2014-04-29 ENCOUNTER — Other Ambulatory Visit (HOSPITAL_COMMUNITY): Payer: Self-pay | Admitting: *Deleted

## 2014-04-29 ENCOUNTER — Ambulatory Visit (HOSPITAL_COMMUNITY)
Admission: RE | Admit: 2014-04-29 | Discharge: 2014-04-29 | Disposition: A | Discharge status: Home / Self Care | Payer: Medicare Other | Source: Ambulatory Visit | Attending: Internal Medicine | Admitting: Internal Medicine

## 2014-04-29 ENCOUNTER — Encounter (HOSPITAL_COMMUNITY): Payer: Self-pay

## 2014-04-29 VITALS — BP 104/64 | HR 60 | Resp 16 | Wt 93.5 lb

## 2014-04-29 DIAGNOSIS — I080 Rheumatic disorders of both mitral and aortic valves: Secondary | ICD-10-CM

## 2014-04-29 DIAGNOSIS — I059 Rheumatic mitral valve disease, unspecified: Secondary | ICD-10-CM | POA: Diagnosis not present

## 2014-04-29 DIAGNOSIS — E875 Hyperkalemia: Secondary | ICD-10-CM | POA: Insufficient documentation

## 2014-04-29 DIAGNOSIS — N184 Chronic kidney disease, stage 4 (severe): Secondary | ICD-10-CM | POA: Diagnosis not present

## 2014-04-29 DIAGNOSIS — I509 Heart failure, unspecified: Secondary | ICD-10-CM | POA: Diagnosis not present

## 2014-04-29 DIAGNOSIS — I5022 Chronic systolic (congestive) heart failure: Secondary | ICD-10-CM | POA: Insufficient documentation

## 2014-04-29 DIAGNOSIS — I50810 Right heart failure, unspecified: Secondary | ICD-10-CM | POA: Insufficient documentation

## 2014-04-29 LAB — BASIC METABOLIC PANEL
BUN: 34 mg/dL — AB (ref 6–23)
CALCIUM: 9 mg/dL (ref 8.4–10.5)
CO2: 32 mEq/L (ref 19–32)
CREATININE: 1.47 mg/dL — AB (ref 0.50–1.10)
Chloride: 91 mEq/L — ABNORMAL LOW (ref 96–112)
GFR, EST AFRICAN AMERICAN: 34 mL/min — AB (ref >90)
GFR, EST NON AFRICAN AMERICAN: 29 mL/min — AB (ref >90)
Glucose, Bld: 113 mg/dL — ABNORMAL HIGH (ref 70–99)
Potassium: 4.5 mEq/L (ref 3.7–5.3)
Sodium: 133 mEq/L — ABNORMAL LOW (ref 137–147)

## 2014-04-29 MED ORDER — ALPRAZOLAM 0.25 MG PO TABS: 0.1250 mg | ORAL_TABLET | Freq: Three times a day (TID) | ORAL | Status: DC | PRN

## 2014-04-29 MED ORDER — POTASSIUM CHLORIDE CRYS ER 10 MEQ PO TBCR: 40.0000 meq | EXTENDED_RELEASE_TABLET | Freq: Every day | ORAL | Status: DC

## 2014-04-29 NOTE — Progress Notes (Addendum)
Patient ID: LATIFFANY HARWICK, female   DOB: [birth year], 80 y.o.   MRN: [id number]  Primary Cardiologist: Dr. Angelena Form PCP: Dr. Linda Hedges   HPI: Ms. Beckstrand is a 80 y.o. female with history of diastolic heart failure, severe mitral regurgitation no surgical candidate, HL and hypothyroidism.      Echo 08/28/12: LVEF 55-60%.  Mild AI, Severe MR.  LA Mod to severely dilated.  PFO/small ASD.  PAPP 70 mmHg.   Echo 12/25/13: EF 55-60%, grade II DD, mild/mod AI, mod/severe MR, LA severely dilated, RV mod/severely dilated and sys fx mod/severely reduced, mod TR  Patient has been admitted 4 times in last 6 months for A/C HF and A/C RF. Family met with Palliative Care. Last d//c weight 94 lbs.   [facility]: Reports feeling ok. Still having SOB with rest. Wears chronic 3L O2. Denies CP, orthopnea, dizziness or PND. Stays in wheelchair most of the day. SBP at home 90-100s. Pulse 60s. Has not been able to weigh since going home d/t too weak. No appetite.   SH: Lives with her daughter.   ROS: All systems negative except as listed in HPI, PMH and Problem List.  Past Medical History  Diagnosis Date  . Coronary artery disease   . Peripheral neuropathy   . Aortic insufficiency     mild  . Cerumen impaction   . Unspecified diastolic heart failure   . Hyperlipemia   . Mitral valve prolapse     "severe" (09/22/2012)  . Mitral regurgitation     severe  . Urosepsis   . Anemia, unspecified   . Osteoporosis   . Adenocarcinoma, breast   . Irritable bladder   . Urinary incontinence   . Hypothyroidism   . H/O: whooping cough   . Hypercholesterolemia     "stopped statins ~ 2 yr ago" (09/22/2012)  . Heart murmur   . CHF (congestive heart failure)   . Shortness of breath     "basically all the time; worse w/exertion" (09/22/2012)  . Bleeding hemorrhoids ~ 2011  . Osteoarthritis     "knees, hips, hands" (09/22/2012)    Current Outpatient Prescriptions  Medication Sig Dispense  Refill  . ALPRAZolam (XANAX) 0.25 MG tablet Take 0.25 mg by mouth 3 (three) times daily as needed for anxiety.      [name] amiodarone (PACERONE) 200 MG tablet Take 1 tablet (200 mg total) by mouth daily.  30 tablet  3  . apixaban (ELIQUIS) 2.5 MG TABS tablet Take 2.5 mg by mouth 2 (two) times daily. 10am and 6:30pm      . carvedilol (COREG) 3.125 MG tablet Take 1 tablet (3.125 mg total) by mouth 2 (two) times daily with a meal.  60 tablet  5  . ciprofloxacin (CIPRO) 500 MG tablet Take 1 tablet (500 mg total) by mouth daily.  5 tablet  0  . cycloSPORINE (RESTASIS) 0.05 % ophthalmic emulsion Place 1 drop into both eyes 2 (two) times daily.       . furosemide (LASIX) 40 MG tablet Take 60 mg by mouth 2 (two) times daily. 10am and 4pm      . guaiFENesin (MUCINEX) 600 MG 12 hr tablet Take 1,200 mg by mouth 2 (two) times daily.      . Multiple Vitamin (MULTIVITAMIN WITH MINERALS) TABS tablet Take 1 tablet by mouth every evening. 4pm      . Polyethyl Glycol-Propyl Glycol (SYSTANE) 0.4-0.3 % GEL Place 1 drop into both eyes at bedtime.      [name]  Polyvinyl Alcohol-Povidone (FRESHKOTE OP) Place 1 drop into both eyes 4 (four) times daily.       . potassium chloride (K-DUR,KLOR-CON) 10 MEQ tablet Take 40 mEq by mouth daily.       No current facility-administered medications for this encounter.    Filed Vitals:   04/29/14 1514  BP: 104/64  Pulse: 60  Resp: 16  Weight: 93 lb 8 oz (42.411 kg)  SpO2: 97%   PHYSICAL EXAM: General:  Elderly in wheelchair. No resp difficulty son and daughter present HEENT: normal Neck: supple. JVP 8-9 with prominent CV waves. Carotids 2+ bilaterally; no bruits. No lymphadenopathy or thryomegaly appreciated. Cor: s/p L mastectomy. PMI normal. Regular rate & regular rhythm. 3/6 MR murmur. No rubs, gallops. Lungs: clear on 3 liters [place] Abdomen: soft, nontender, nondistended. No hepatosplenomegaly. No bruits or masses. Good bowel sounds. Extremities: no cyanosis, clubbing, rash, tr  edema Neuro: alert & orientedx3, cranial nerves grossly intact. Moves all 4 extremities w/o difficulty. Affect pleasant.   ASSESSMENT & PLAN:  1) Chronic diastolic HF:  - Patient has been admitted multiple times in the past few months for A/C diastolic HF and A/C RF. She has severe right HF along with severe valvular disease. She is too weak to weight currently and has no appetite. Volume status slightly elevated today. Discussed with the family about the use of sliding scale diuretics and if she seems more SOB since she can't weigh to give an extra lasix. Check BMET today to assess K+ has had issues with hyperkalemia.  - Lengthy discussion with family about prognosis. It is just a matter of time before she develops A/C HF again. With her severe valvular disease and RHF it is going to be very difficult to manage fluid. Family met with Palliative Care in the hospital but are not quite ready to move towards hospice but think they are moving that way. Major issue now is keeping the patient comfortable. They have a family meeting tomorrow and will discuss Hospice, which I suggest and will call if they would like to move forward with that option.    F/U 1 week with MD.   - Spent over 30 minutes with patient discussing the above.  [name] NP-C 3:27 PM

## 2014-04-29 NOTE — Patient Instructions (Signed)
Continue medications as prescribed.  Call any issues.  If she gets short of breath ok to take an extra lasix.  Follow up in 1 week with Dr. Haroldine Laws  Do the following things EVERYDAY: 1) Weigh yourself in the morning before breakfast. Write it down and keep it in a log. 2) Take your medicines as prescribed 3) Eat low salt foods-Limit salt (sodium) to 2000 mg per day.  4) Stay as active as you can everyday 5) Limit all fluids for the day to less than 2 liters 6)

## 2014-05-01 ENCOUNTER — Other Ambulatory Visit (HOSPITAL_COMMUNITY): Payer: Self-pay | Admitting: Cardiology

## 2014-05-01 DIAGNOSIS — I5031 Acute diastolic (congestive) heart failure: Secondary | ICD-10-CM

## 2014-05-01 MED ORDER — POTASSIUM CHLORIDE ER 10 MEQ PO CPCR: 40.0000 meq | ORAL_CAPSULE | Freq: Every day | ORAL | Status: DC

## 2014-05-02 ENCOUNTER — Inpatient Hospital Stay (HOSPITAL_COMMUNITY): Payer: Medicare Other

## 2014-05-07 ENCOUNTER — Other Ambulatory Visit (HOSPITAL_COMMUNITY): Payer: Self-pay

## 2014-05-08 ENCOUNTER — Ambulatory Visit (HOSPITAL_COMMUNITY)
Admission: RE | Admit: 2014-05-08 | Discharge: 2014-05-08 | Disposition: A | Discharge status: Home / Self Care | Payer: Medicare Other | Source: Ambulatory Visit | Attending: Internal Medicine | Admitting: Internal Medicine

## 2014-05-08 VITALS — BP 102/46 | HR 62 | Wt 92.5 lb

## 2014-05-08 DIAGNOSIS — I2510 Atherosclerotic heart disease of native coronary artery without angina pectoris: Secondary | ICD-10-CM | POA: Diagnosis not present

## 2014-05-08 DIAGNOSIS — E7800 Pure hypercholesterolemia, unspecified: Secondary | ICD-10-CM | POA: Diagnosis not present

## 2014-05-08 DIAGNOSIS — I5030 Unspecified diastolic (congestive) heart failure: Secondary | ICD-10-CM | POA: Diagnosis present

## 2014-05-08 DIAGNOSIS — Z09 Encounter for follow-up examination after completed treatment for conditions other than malignant neoplasm: Secondary | ICD-10-CM | POA: Insufficient documentation

## 2014-05-08 DIAGNOSIS — E039 Hypothyroidism, unspecified: Secondary | ICD-10-CM | POA: Insufficient documentation

## 2014-05-08 DIAGNOSIS — I50810 Right heart failure, unspecified: Secondary | ICD-10-CM

## 2014-05-08 DIAGNOSIS — I5032 Chronic diastolic (congestive) heart failure: Secondary | ICD-10-CM | POA: Diagnosis not present

## 2014-05-08 DIAGNOSIS — I509 Heart failure, unspecified: Secondary | ICD-10-CM

## 2014-05-08 DIAGNOSIS — Z79899 Other long term (current) drug therapy: Secondary | ICD-10-CM | POA: Insufficient documentation

## 2014-05-08 DIAGNOSIS — I080 Rheumatic disorders of both mitral and aortic valves: Secondary | ICD-10-CM

## 2014-05-08 NOTE — Progress Notes (Signed)
Patient ID: [name], female   DOB: 05/16/1918, 80 y.o.   MRN: [id number]  Primary Cardiologist: Dr. Angelena Form PCP: Dr. Linda Hedges   HPI: [name] is a 80 y.o. female with history of diastolic heart failure, right heart failure, severe mitral regurgitation not surgical candidate, HL and hypothyroidism.      Echo 08/28/12: LVEF 55-60%.  Mild AI, Severe MR.  LA Mod to severely dilated.  PFO/small ASD.  PAPP 70 mmHg.   Echo 12/25/13: EF 55-60%, grade II DD, mild/mod AI, mod/severe MR, LA severely dilated, RV mod/severely dilated and sys fx mod/severely reduced, mod TR  Patient has been admitted 4 times in last 6 months for A/C HF and A/C RF. Family met with Palliative Care. Last d/c weight 94 lbs.   [facility]: [facility]. Was seen in Clinic last week and was very weak unable to weigh or do any activity. Hospice suggested. Here for f/u. Since d/c her family feels like she is getting a little stronger but still not able to stand by herself. Able to sleep most of the night with Xanax. Breathing ok. No orthopnea or PND. Working with [facility] PT. Taking lasix 60 bid. Not using metolazone. No bleeding with apixaban.   Cr up 1.15 to 1.47. Weight down to 92 pounds.   SH: Lives with her daughter.   ROS: All systems negative except as listed in HPI, PMH and Problem List.  Past Medical History  Diagnosis Date  . Coronary artery disease   . Peripheral neuropathy   . Aortic insufficiency     mild  . Cerumen impaction   . Unspecified diastolic heart failure   . Hyperlipemia   . Mitral valve prolapse     "severe" (09/22/2012)  . Mitral regurgitation     severe  . Urosepsis   . Anemia, unspecified   . Osteoporosis   . Adenocarcinoma, breast   . Irritable bladder   . Urinary incontinence   . Hypothyroidism   . H/O: whooping cough   . Hypercholesterolemia     "stopped statins ~ 2 yr ago" (09/22/2012)  . Heart murmur   . CHF (congestive heart failure)   .  Shortness of breath     "basically all the time; worse w/exertion" (09/22/2012)  . Bleeding hemorrhoids ~ 2011  . Osteoarthritis     "knees, hips, hands" (09/22/2012)    Current Outpatient Prescriptions  Medication Sig Dispense Refill  . ALPRAZolam (XANAX) 0.25 MG tablet Take 0.5 tablets (0.125 mg total) by mouth 3 (three) times daily as needed for anxiety.  60 tablet  2  . amiodarone (PACERONE) 200 MG tablet Take 1 tablet (200 mg total) by mouth daily.  30 tablet  3  . apixaban (ELIQUIS) 2.5 MG TABS tablet Take 2.5 mg by mouth 2 (two) times daily. 10am and 6:30pm      . carvedilol (COREG) 3.125 MG tablet Take 1 tablet (3.125 mg total) by mouth 2 (two) times daily with a meal.  60 tablet  5  . cycloSPORINE (RESTASIS) 0.05 % ophthalmic emulsion Place 1 drop into both eyes 2 (two) times daily.       . furosemide (LASIX) 40 MG tablet Take 60 mg by mouth 2 (two) times daily. 10am and 4pm      . guaiFENesin (MUCINEX) 600 MG 12 hr tablet Take 1,200 mg by mouth 2 (two) times daily.      . Multiple Vitamin (MULTIVITAMIN WITH MINERALS) TABS tablet Take 1  tablet by mouth every evening. 4pm      . nitrofurantoin (MACRODANTIN) 50 MG capsule Take 50 mg by mouth at bedtime.      Vladimir Faster Glycol-Propyl Glycol (SYSTANE) 0.4-0.3 % GEL Place 1 drop into both eyes at bedtime.      . Polyvinyl Alcohol-Povidone (FRESHKOTE OP) Place 1 drop into both eyes 4 (four) times daily.       . potassium chloride (MICRO-K) 10 MEQ CR capsule Take 4 capsules (40 mEq total) by mouth daily.  120 capsule  3   No current facility-administered medications for this encounter.    Filed Vitals:   05/08/14 1525  BP: 102/46  Pulse: 62  Weight: 92 lb 8 oz (41.958 kg)  SpO2: 94%   PHYSICAL EXAM: General:  Elderly in wheelchair. No resp difficulty son and daughter present HEENT: normal Neck: supple. JVP 7 with prominent CV waves. Carotids 2+ bilaterally; no bruits. No lymphadenopathy or thryomegaly appreciated. Cor: s/p L  mastectomy. PMI normal. Regular rate & regular rhythm. 3/6 MR murmur. No rubs, gallops. Lungs: clear on 3 liters [place] Abdomen: soft, nontender, nondistended. No hepatosplenomegaly. No bruits or masses. Good bowel sounds. Extremities: no cyanosis, clubbing, rash, tr edema Neuro: alert & orientedx3, cranial nerves grossly intact. Moves all 4 extremities w/o difficulty. Affect pleasant.   ASSESSMENT & PLAN:  1) Chronic diastolic HF:  Her volume status is improved but she continues to struggle with end-stage HF and now requires almost full assist from her family for even the simplest tasks. They are interested in engaging palliative care and Hospice services but they would prefer not to lose [facility] and HHPT services. I discussed the case with Rosanne Sack of [facility] and she will contact them and has informed me that they can get Palliative Care services (not Hospice) without giving up HHRN/PT. I reinforced the use if sliding-scale metolazone to keep her dry.  Total time spent 45 minutes. Over half that time spent discussing above.     Glori Bickers MD 3:48 PM

## 2014-05-08 NOTE — Patient Instructions (Signed)
Your physician recommends that you schedule a follow-up appointment in: 1 month  

## 2014-05-10 ENCOUNTER — Telehealth (HOSPITAL_COMMUNITY): Payer: Self-pay | Admitting: Vascular Surgery

## 2014-05-10 NOTE — Telephone Encounter (Signed)
[facility] wants to add occupational therapy she needs orders. Verbal is ok... Please advise.[name]

## 2014-05-13 ENCOUNTER — Encounter (HOSPITAL_COMMUNITY): Payer: Medicare Other

## 2014-05-13 NOTE — Telephone Encounter (Signed)
Left VM ok to order

## 2014-05-14 ENCOUNTER — Ambulatory Visit (INDEPENDENT_AMBULATORY_CARE_PROVIDER_SITE_OTHER): Payer: Medicare Other | Admitting: Internal Medicine

## 2014-05-14 ENCOUNTER — Other Ambulatory Visit (INDEPENDENT_AMBULATORY_CARE_PROVIDER_SITE_OTHER): Payer: Medicare Other

## 2014-05-14 ENCOUNTER — Encounter: Payer: Self-pay | Admitting: Internal Medicine

## 2014-05-14 VITALS — BP 92/50 | HR 62 | Temp 97.5°F | Wt 94.8 lb

## 2014-05-14 DIAGNOSIS — L8992 Pressure ulcer of unspecified site, stage 2: Secondary | ICD-10-CM

## 2014-05-14 DIAGNOSIS — R627 Adult failure to thrive: Secondary | ICD-10-CM

## 2014-05-14 DIAGNOSIS — L8990 Pressure ulcer of unspecified site, unspecified stage: Secondary | ICD-10-CM

## 2014-05-14 DIAGNOSIS — R946 Abnormal results of thyroid function studies: Secondary | ICD-10-CM

## 2014-05-14 NOTE — Progress Notes (Signed)
Pre visit review using our clinic review tool, if applicable. No additional management support is needed unless otherwise documented below in the visit note. 

## 2014-05-14 NOTE — Progress Notes (Signed)
   Subjective:    Patient ID: [name], female    DOB: [birth year], 80 y.o.   MRN: [id number]  [facility] record 6/21-6/26/15 were reviewed. She was admitted with acute diastolic congestive heart failure in the setting of urinary tract infection with Escherichia coli with associated sepsis  At discharge it was recommended she followup with the congestive heart clinic which she has done. She was also to followup here in reference to her thyroid function tests in the context of amiodarone therapy.TSH was 10.68.    Review of Systems    No significant change in weight; significant fatigue; sleep disorder; change in appetite. No blurred, double vision ,loss of vision No palpitations; racing; irregularity No constipation; diarrhea;hoarseness;dysphagia No change in nails,hair,skin No numbness or tingling; tremor No anxiety; depression; panic attacks. Xanx qhs to prevent restless leg associated symptoms. No temperature intolerance to heat ,cold    Dysuria, pyuria, hematuria, frequency, nocturia or polyuria are denied.     Objective:   Physical Exam   Significant or distinguishing  findings on physical exam are documented first.  Below that are other systems examined & findings.  In wheelchair She appears younger than her stated age she is extremely frail and like a little bird. She has decreased auditory acuity despite bilateral hearing aids Heart rhythm is irregular heart sounds are distant With deep inspiration dry rales heard at the bases There's marked accentuation of the thoracic curvature There is a 2 x 1.5 cm area of erythema over the mid thoracic spine. There is a central ulcer 2 x 2.5 mm. There is no purulence present. Bowel sounds are decreased; abdomen is distended but nontender Marked DJD hand changes She has trace edema at ankles. Pedal pulses are decreased but equal    Eyes: No conjunctival inflammation or scleral icterus is present. Oral exam: Dental  hygiene is good;lower partial ;lips and gums are healthy appearing. Lungs:No increased work of breathing.  Skin:Warm & dry.  Intact without suspicious  rashes ; no jaundice or tenting Lymphatic: No lymphadenopathy is noted about the head, neck, axilla              Assessment & Plan:  #1  abnormal thyroid function tests most likely related to amiodarone therapy #2 pressure ulcer w/o purulence #3 congestive heart failure, compensated #4 adult failure to thrive  See orders and after visit summary

## 2014-05-14 NOTE — Patient Instructions (Signed)
Dip gauze in  sterile saline and applied to the wound twice a day. Cover the wound with Telfa , non stick dressing  without any antibiotic ointment. The saline can be purchased at the drugstore or you can make your own .Boil cup of salt in a gallon of water. Store mixture  in a clean container.Report Warning  signs as discussed (red streaks, pus, fever, increasing pain).   Your next office appointment will be determined based upon review of your pending labs . Those instructions will be transmitted to you through My Chart .

## 2014-05-15 LAB — TSH: TSH: 22 u[IU]/mL — ABNORMAL HIGH (ref 0.35–4.50)

## 2014-05-15 LAB — T4, FREE: FREE T4: 0.8 ng/dL (ref 0.60–1.60)

## 2014-05-16 ENCOUNTER — Other Ambulatory Visit: Payer: Self-pay | Admitting: Internal Medicine

## 2014-05-16 DIAGNOSIS — R946 Abnormal results of thyroid function studies: Secondary | ICD-10-CM

## 2014-05-16 MED ORDER — LEVOTHYROXINE SODIUM 25 MCG PO TABS: 25.0000 ug | ORAL_TABLET | Freq: Every day | ORAL | Status: DC

## 2014-05-17 ENCOUNTER — Telehealth: Payer: Self-pay | Admitting: *Deleted

## 2014-05-17 DIAGNOSIS — I509 Heart failure, unspecified: Secondary | ICD-10-CM

## 2014-05-17 DIAGNOSIS — J9622 Acute and chronic respiratory failure with hypercapnia: Secondary | ICD-10-CM

## 2014-05-17 NOTE — Telephone Encounter (Signed)
Left msg on triage stating mom saw Dr. Linna Darner on 05/14/14. He recommended mom to use a egg crate mattress for her hospital bed. [facility] stated they need md to fx over prescription.Marland KitchenMarland KitchenMarland Kitchen

## 2014-05-17 NOTE — Telephone Encounter (Signed)
Generated order for mattress place on md desk for signature...[name]

## 2014-05-17 NOTE — Telephone Encounter (Signed)
MD has sign notified daughter faxing to [facility]...[name]

## 2014-05-20 ENCOUNTER — Telehealth: Payer: Self-pay

## 2014-05-20 NOTE — Telephone Encounter (Signed)
Phone call from [place] with [facility] [phone] ext [phone] (fax [phone]) stating he received an order for an egg crate mattress. He is requesting notes explaining the need for this along with NPI # with physician initials and date. This info can be faxed.

## 2014-05-24 ENCOUNTER — Telehealth: Payer: Self-pay | Admitting: Internal Medicine

## 2014-05-24 NOTE — Telephone Encounter (Signed)
I signed order for eggcrate [place] [facility] printed out

## 2014-05-24 NOTE — Telephone Encounter (Signed)
Hopp, please see 05/20/2014 phone note

## 2014-05-24 NOTE — Telephone Encounter (Signed)
Faxed clinical notes from [facility] 05/14/14 per md.../lmb

## 2014-05-24 NOTE — Telephone Encounter (Signed)
Order was faxed back to [facility] on 05/17/14. What [facility] is needing is clinically notes from md stating why he want pt to have egg crate " Medical Necessity ...[name]"

## 2014-05-24 NOTE — Telephone Encounter (Signed)
Patients daughter called in regards to notes that needed to be faxed to [facility] (please see last phone note).  She states they have not received notes.

## 2014-06-07 ENCOUNTER — Telehealth (HOSPITAL_COMMUNITY): Payer: Self-pay | Admitting: Vascular Surgery

## 2014-06-07 NOTE — Telephone Encounter (Signed)
[facility] OT there wants order cont home health OT 2 WEEK 1

## 2014-06-07 NOTE — Telephone Encounter (Signed)
Left mess on ID VM ok to continue OT

## 2014-06-10 ENCOUNTER — Encounter (HOSPITAL_COMMUNITY): Payer: Self-pay

## 2014-06-10 ENCOUNTER — Other Ambulatory Visit (HOSPITAL_COMMUNITY): Payer: Self-pay

## 2014-06-10 ENCOUNTER — Ambulatory Visit (HOSPITAL_COMMUNITY)
Admission: RE | Admit: 2014-06-10 | Discharge: 2014-06-10 | Disposition: A | Discharge status: Home / Self Care | Payer: Medicare Other | Source: Ambulatory Visit | Attending: Internal Medicine | Admitting: Internal Medicine

## 2014-06-10 VITALS — BP 100/48 | HR 64 | Resp 18 | Wt 102.1 lb

## 2014-06-10 DIAGNOSIS — I509 Heart failure, unspecified: Secondary | ICD-10-CM | POA: Diagnosis not present

## 2014-06-10 DIAGNOSIS — E039 Hypothyroidism, unspecified: Secondary | ICD-10-CM | POA: Diagnosis not present

## 2014-06-10 DIAGNOSIS — M1990 Unspecified osteoarthritis, unspecified site: Secondary | ICD-10-CM | POA: Diagnosis not present

## 2014-06-10 DIAGNOSIS — I5032 Chronic diastolic (congestive) heart failure: Secondary | ICD-10-CM | POA: Diagnosis present

## 2014-06-10 DIAGNOSIS — I2510 Atherosclerotic heart disease of native coronary artery without angina pectoris: Secondary | ICD-10-CM | POA: Insufficient documentation

## 2014-06-10 DIAGNOSIS — I080 Rheumatic disorders of both mitral and aortic valves: Secondary | ICD-10-CM | POA: Diagnosis not present

## 2014-06-10 DIAGNOSIS — M810 Age-related osteoporosis without current pathological fracture: Secondary | ICD-10-CM | POA: Insufficient documentation

## 2014-06-10 DIAGNOSIS — E785 Hyperlipidemia, unspecified: Secondary | ICD-10-CM | POA: Insufficient documentation

## 2014-06-10 DIAGNOSIS — G609 Hereditary and idiopathic neuropathy, unspecified: Secondary | ICD-10-CM | POA: Insufficient documentation

## 2014-06-10 DIAGNOSIS — R32 Unspecified urinary incontinence: Secondary | ICD-10-CM | POA: Diagnosis not present

## 2014-06-10 DIAGNOSIS — I50810 Right heart failure, unspecified: Secondary | ICD-10-CM

## 2014-06-10 LAB — BASIC METABOLIC PANEL
Anion gap: 12 (ref 5–15)
BUN: 18 mg/dL (ref 6–23)
CALCIUM: 8.7 mg/dL (ref 8.4–10.5)
CO2: 30 mEq/L (ref 19–32)
CREATININE: 1.07 mg/dL (ref 0.50–1.10)
Chloride: 96 mEq/L (ref 96–112)
GFR, EST AFRICAN AMERICAN: 50 mL/min — AB (ref >90)
GFR, EST NON AFRICAN AMERICAN: 43 mL/min — AB (ref >90)
GLUCOSE: 83 mg/dL (ref 70–99)
Potassium: 4 mEq/L (ref 3.7–5.3)
Sodium: 138 mEq/L (ref 137–147)

## 2014-06-10 MED ORDER — METOLAZONE 2.5 MG PO TABS: 2.5000 mg | ORAL_TABLET | ORAL | Status: DC

## 2014-06-10 MED ORDER — AMIODARONE HCL 200 MG PO TABS: 200.0000 mg | ORAL_TABLET | Freq: Every day | ORAL | Status: DC

## 2014-06-10 NOTE — Progress Notes (Signed)
Patient ID: [name], female   DOB: Jan 26, 1918, 80 y.o.   MRN: [id number]  Primary Cardiologist: Dr. Angelena Form PCP: Dr. Linda Hedges   HPI: [name] is a 80 y.o. female with history of diastolic heart failure, right heart failure, severe mitral regurgitation not surgical candidate, HL and hypothyroidism.      Echo 08/28/12: LVEF 55-60%.  Mild AI, Severe MR.  LA Mod to severely dilated.  PFO/small ASD.  PAPP 70 mmHg.   Echo 12/25/13: EF 55-60%, grade II DD, mild/mod AI, mod/severe MR, LA severely dilated, RV mod/severely dilated and sys fx mod/severely reduced, mod TR  Patient has been admitted 4 times in last 6 months for A/C HF and A/C RF. Family met with Palliative Care. Last d/c weight 94 lbs.   Follow up for Heart Failure: Doing well. Denies SOB, CP, PND or orthopnea. Sleeping in hospital bed. Weight stable at 99 lbs (up 4 lbs in last few weeks). HHPT finished and HHOT and RN finishing this week. Family and patient think she is getting stronger. Appetite better. Has not needed any extra diuretics. Feet are starting to swell. No bleeding issues.   SH: Lives with her daughter.   ROS: All systems negative except as listed in HPI, PMH and Problem List.  Past Medical History  Diagnosis Date  . Coronary artery disease   . Peripheral neuropathy   . Aortic insufficiency     mild  . Cerumen impaction   . Unspecified diastolic heart failure   . Hyperlipemia   . Mitral valve prolapse     "severe" (09/22/2012)  . Mitral regurgitation     severe  . Urosepsis   . Anemia, unspecified   . Osteoporosis   . Adenocarcinoma, breast   . Irritable bladder   . Urinary incontinence   . Hypothyroidism   . H/O: whooping cough   . Hypercholesterolemia     "stopped statins ~ 2 yr ago" (09/22/2012)  . Heart murmur   . CHF (congestive heart failure)   . Shortness of breath     "basically all the time; worse w/exertion" (09/22/2012)  . Bleeding hemorrhoids ~ 2011  . Osteoarthritis     "knees,  hips, hands" (09/22/2012)    Current Outpatient Prescriptions  Medication Sig Dispense Refill  . ALPRAZolam (XANAX) 0.25 MG tablet Take 0.5 tablets (0.125 mg total) by mouth 3 (three) times daily as needed for anxiety.  60 tablet  2  . amiodarone (PACERONE) 200 MG tablet Take 1 tablet (200 mg total) by mouth daily.  30 tablet  3  . apixaban (ELIQUIS) 2.5 MG TABS tablet Take 2.5 mg by mouth 2 (two) times daily. 10am and 6:30pm      . carvedilol (COREG) 3.125 MG tablet Take 1 tablet (3.125 mg total) by mouth 2 (two) times daily with a meal.  60 tablet  5  . cycloSPORINE (RESTASIS) 0.05 % ophthalmic emulsion Place 1 drop into both eyes 2 (two) times daily.       . furosemide (LASIX) 40 MG tablet Take 60 mg by mouth 2 (two) times daily. 10am and 4pm      . guaiFENesin (MUCINEX) 600 MG 12 hr tablet Take 1,200 mg by mouth 2 (two) times daily.      [name] levothyroxine (LEVOTHROID) 25 MCG tablet Take 1 tablet (25 mcg total) by mouth daily before breakfast.  30 tablet  2  . metolazone (ZAROXOLYN) 2.5 MG tablet Take 2.5 mg by mouth as needed.      [name]  Multiple Vitamin (MULTIVITAMIN WITH MINERALS) TABS tablet Take 1 tablet by mouth every evening. 4pm      . Multiple Vitamins-Minerals (PRESERVISION AREDS 2 PO) Take 2 tablets by mouth daily.      . nitrofurantoin (MACRODANTIN) 50 MG capsule Take 50 mg by mouth at bedtime.      Vladimir Faster Glycol-Propyl Glycol (SYSTANE) 0.4-0.3 % GEL Place 1 drop into both eyes at bedtime.      . Polyvinyl Alcohol-Povidone (FRESHKOTE OP) Place 1 drop into both eyes 4 (four) times daily.       . potassium chloride (MICRO-K) 10 MEQ CR capsule Take 4 capsules (40 mEq total) by mouth daily.  120 capsule  3   No current facility-administered medications for this encounter.    Filed Vitals:   06/10/14 1526  BP: 100/48  Pulse: 64  Resp: 18  Weight: 102 lb 2 oz (46.324 kg)  SpO2: 94%   PHYSICAL EXAM: General:  Elderly in wheelchair. No resp difficulty son and daughter  present HEENT: normal Neck: supple. JVP 8 with prominent CV waves. Carotids 2+ bilaterally; no bruits. No lymphadenopathy or thryomegaly appreciated. Cor: s/p L mastectomy. PMI normal. Regular rate & regular rhythm. 3/6 MR murmur. No rubs, gallops. Lungs: clear on 3 liters [place] Abdomen: soft, nontender, nondistended. No hepatosplenomegaly. No bruits or masses. Good bowel sounds. Extremities: no cyanosis, clubbing, rash, 1+ bilateral  edema Neuro: alert & orientedx3, cranial nerves grossly intact. Moves all 4 extremities w/o difficulty. Affect pleasant.   ASSESSMENT & PLAN:  1) Chronic diastolic HF:  - NYHA IIIb/IV symptoms and volume status slightly elevated. She is up about 8 lbs since last visit. Will have her start taking metolazone 2.5 mg every Tuesday with and extra 10 meq of potassium. Discussed with the family if she is more SOB it is ok to use sliding scale diuretics. She does now have Palliative Care involved and they feel comfortable calling them if they need to. She is about to be finished with HHRN/PT but will see if I can reorder since she is home bound. Will see back in 1 month but told the family if she has increased SOB to call clinic. Check BMET today. Would like weight closer to 96 lbs at home.   F/U 1 month [name] NP-C 3:29 PM

## 2014-06-10 NOTE — Patient Instructions (Signed)
Doing great.  Start metolazone 2.5 mg every Tuesday. If home weight is less than 96 lbs, hold.  Call any issues.  Will try to reorder home health.  F/U 1 month  Do the following things EVERYDAY: 1) Weigh yourself in the morning before breakfast. Write it down and keep it in a log. 2) Take your medicines as prescribed 3) Eat low salt foods-Limit salt (sodium) to 2000 mg per day.  4) Stay as active as you can everyday 5) Limit all fluids for the day to less than 2 liters 6)

## 2014-06-29 ENCOUNTER — Other Ambulatory Visit: Payer: Self-pay | Admitting: Internal Medicine

## 2014-07-11 ENCOUNTER — Encounter (HOSPITAL_COMMUNITY): Payer: Self-pay

## 2014-07-11 ENCOUNTER — Ambulatory Visit (HOSPITAL_COMMUNITY)
Admission: RE | Admit: 2014-07-11 | Discharge: 2014-07-11 | Disposition: A | Discharge status: Home / Self Care | Payer: Medicare Other | Source: Ambulatory Visit | Attending: Internal Medicine | Admitting: Internal Medicine

## 2014-07-11 VITALS — BP 94/60 | HR 71 | Wt 98.6 lb

## 2014-07-11 DIAGNOSIS — I059 Rheumatic mitral valve disease, unspecified: Secondary | ICD-10-CM | POA: Insufficient documentation

## 2014-07-11 DIAGNOSIS — I509 Heart failure, unspecified: Secondary | ICD-10-CM | POA: Diagnosis not present

## 2014-07-11 DIAGNOSIS — I50810 Right heart failure, unspecified: Secondary | ICD-10-CM

## 2014-07-11 DIAGNOSIS — I480 Paroxysmal atrial fibrillation: Secondary | ICD-10-CM

## 2014-07-11 DIAGNOSIS — I2510 Atherosclerotic heart disease of native coronary artery without angina pectoris: Secondary | ICD-10-CM | POA: Diagnosis not present

## 2014-07-11 DIAGNOSIS — G609 Hereditary and idiopathic neuropathy, unspecified: Secondary | ICD-10-CM | POA: Insufficient documentation

## 2014-07-11 DIAGNOSIS — I4891 Unspecified atrial fibrillation: Secondary | ICD-10-CM | POA: Insufficient documentation

## 2014-07-11 DIAGNOSIS — I5032 Chronic diastolic (congestive) heart failure: Secondary | ICD-10-CM | POA: Insufficient documentation

## 2014-07-11 DIAGNOSIS — E039 Hypothyroidism, unspecified: Secondary | ICD-10-CM | POA: Insufficient documentation

## 2014-07-11 LAB — BASIC METABOLIC PANEL
Anion gap: 10 (ref 5–15)
BUN: 20 mg/dL (ref 6–23)
CALCIUM: 8.7 mg/dL (ref 8.4–10.5)
CO2: 33 mEq/L — ABNORMAL HIGH (ref 19–32)
CREATININE: 1.09 mg/dL (ref 0.50–1.10)
Chloride: 97 mEq/L (ref 96–112)
GFR, EST AFRICAN AMERICAN: 49 mL/min — AB (ref >90)
GFR, EST NON AFRICAN AMERICAN: 42 mL/min — AB (ref >90)
GLUCOSE: 110 mg/dL — AB (ref 70–99)
Potassium: 3.9 mEq/L (ref 3.7–5.3)
Sodium: 140 mEq/L (ref 137–147)

## 2014-07-11 MED ORDER — METOLAZONE 2.5 MG PO TABS: 1.2500 mg | ORAL_TABLET | ORAL | Status: DC | PRN

## 2014-07-11 NOTE — Progress Notes (Signed)
Patient ID: [name], female   DOB: 09-23-1918, 80 y.o.   MRN: [id number]  Primary Cardiologist: Dr. Angelena Form PCP: Dr. Linda Hedges   HPI: [name] is a 80 y.o. female with history of diastolic heart failure, right heart failure, atrial fibrillation, severe mitral regurgitation not surgical candidate, HL and hypothyroidism.      Echo 08/28/12: LVEF 55-60%.  Mild AI, Severe MR.  LA Mod to severely dilated.  PFO/small ASD.  PAPP 70 mmHg.   Echo 12/25/13: EF 55-60%, grade II DD, mild/mod AI, mod/severe MR, LA severely dilated, RV mod/severely dilated and sys fx mod/severely reduced, mod TR  Patient has been admitted 4 times in last 6 months for A/C HF and A/C RF. Family met with Palliative Care. Last d/c weight 94 lbs.   Follow up for Heart Failure: Last visit started metolazone 2.5 mg PRN for weight greater than 97 lbs,  however with one dose lost 6 lbs and got very weak. Family is talking with Sable Feil from [place]. Weight back up to 95 lbs. Denies SOB with sitting but gets pretty SOB with movement. Denies CP, PND or edema. + orthopnea sleeping in a hospital bed. Getting weaker. Appetite good for patient. Following a low salt diet and drinking less than 2L a day. In the chair most of the day.   SH: Lives with her daughter.   ROS: All systems negative except as listed in HPI, PMH and Problem List.  Past Medical History  Diagnosis Date  . Coronary artery disease   . Peripheral neuropathy   . Aortic insufficiency     mild  . Cerumen impaction   . Unspecified diastolic heart failure   . Hyperlipemia   . Mitral valve prolapse     "severe" (09/22/2012)  . Mitral regurgitation     severe  . Urosepsis   . Anemia, unspecified   . Osteoporosis   . Adenocarcinoma, breast   . Irritable bladder   . Urinary incontinence   . Hypothyroidism   . H/O: whooping cough   . Hypercholesterolemia     "stopped statins ~ 2 yr ago" (09/22/2012)  . Heart murmur   . CHF (congestive  heart failure)   . Shortness of breath     "basically all the time; worse w/exertion" (09/22/2012)  . Bleeding hemorrhoids ~ 2011  . Osteoarthritis     "knees, hips, hands" (09/22/2012)    Current Outpatient Prescriptions  Medication Sig Dispense Refill  . ALPRAZolam (XANAX) 0.25 MG tablet Take 0.5 tablets (0.125 mg total) by mouth 3 (three) times daily as needed for anxiety.  60 tablet  2  . amiodarone (PACERONE) 200 MG tablet Take 1 tablet (200 mg total) by mouth daily.  30 tablet  3  . apixaban (ELIQUIS) 2.5 MG TABS tablet Take 2.5 mg by mouth 2 (two) times daily. 10am and 6:30pm      . carvedilol (COREG) 3.125 MG tablet take 1 tablet by mouth twice a day with meals unless pulse is less than 60      . cycloSPORINE (RESTASIS) 0.05 % ophthalmic emulsion Place 1 drop into both eyes 2 (two) times daily.       . furosemide (LASIX) 40 MG tablet Take 60 mg by mouth 2 (two) times daily. 10am and 4pm      . guaiFENesin (MUCINEX) 600 MG 12 hr tablet Take 600 mg by mouth daily.       [name] levothyroxine (LEVOTHROID) 25 MCG tablet Take 1 tablet (25 mcg  total) by mouth daily before breakfast.  30 tablet  2  . metolazone (ZAROXOLYN) 2.5 MG tablet Take 2.5 mg by mouth as needed. Only as needed      . Multiple Vitamin (MULTIVITAMIN WITH MINERALS) TABS tablet Take 1 tablet by mouth every evening. 4pm      . Multiple Vitamins-Minerals (PRESERVISION AREDS 2 PO) Take 2 tablets by mouth daily.      . nitrofurantoin (MACRODANTIN) 50 MG capsule Take 50 mg by mouth at bedtime.      Vladimir Faster Glycol-Propyl Glycol (SYSTANE) 0.4-0.3 % GEL Place 1 drop into both eyes at bedtime.      . Polyvinyl Alcohol-Povidone (FRESHKOTE OP) Place 1 drop into both eyes 4 (four) times daily.       . potassium chloride (MICRO-K) 10 MEQ CR capsule Take 4 capsules (40 mEq total) by mouth daily.  120 capsule  3   No current facility-administered medications for this encounter.    Filed Vitals:   07/11/14 1519  BP: 94/60  Pulse: 71   Weight: 98 lb 9.6 oz (44.725 kg)  SpO2: 96%   PHYSICAL EXAM: General:  Elderly in wheelchair. No resp difficulty son and daughter present HEENT: normal Neck: supple. JVP 8 with prominent CV waves. Carotids 2+ bilaterally; no bruits. No lymphadenopathy or thryomegaly appreciated. Cor: s/p L mastectomy. PMI normal. Regular rate & regular rhythm. 3/6 MR murmur. No rubs, gallops. Lungs: clear on 3 liters [place] Abdomen: soft, nontender, nondistended. No hepatosplenomegaly. No bruits or masses. Good bowel sounds. Extremities: no cyanosis, clubbing, rash, trace bilateral edema Neuro: alert & orientedx3, cranial nerves grossly intact. Moves all 4 extremities w/o difficulty. Affect pleasant.   ASSESSMENT & PLAN:  1) Chronic diastolic HF:  - NYHA IIIb/IV symptoms and volume status stable. Last visit we started metolazone 2.5 mg PRN for weight greater than 97 lbs, however when she took it her weight dropped 6 lbs and she became very weak. Discussion with family about next time trying metolazone 1.25 mg and that if her weight is 97 lbs and she is not short of breath that they can hold it, but to just give for symptoms. It is going to be a balancing act trying to keep the fluid off and keeping her asymptomatic and at the same time not drying her out too much.  - Will stop coreg with low BP and reports of HR dropping into the upper 50s to low 60s.  - The family is meeting with Sharyn Lull from [facility] regularly now and encouraged them to continue to meet with her for goals of care and symptom management. Patient does not seem to be in any discomfort and reports she is not having any pain.  - Check BMET today.  2) Atrial Fibrillation - Appears to be in NSR today. Will as above stop coreg and continue amiodarone.    F/U 6 weeks [name] NP-C 3:26 PM

## 2014-07-11 NOTE — Patient Instructions (Signed)
Doing great.  Will change metolazone to 1.25 mg (1/2 tablet) as needed for weight greater than 97 lbs and if she is short of breath.   Stop Coreg  Follow up in 6 weeks or as needed.

## 2014-07-18 ENCOUNTER — Encounter: Payer: Self-pay | Admitting: Internal Medicine

## 2014-07-18 ENCOUNTER — Ambulatory Visit (INDEPENDENT_AMBULATORY_CARE_PROVIDER_SITE_OTHER): Payer: Medicare Other | Admitting: Internal Medicine

## 2014-07-18 ENCOUNTER — Other Ambulatory Visit (INDEPENDENT_AMBULATORY_CARE_PROVIDER_SITE_OTHER): Payer: Medicare Other

## 2014-07-18 VITALS — BP 100/52 | HR 74 | Temp 97.4°F | Resp 14 | Ht 59.0 in | Wt 97.0 lb

## 2014-07-18 DIAGNOSIS — E032 Hypothyroidism due to medicaments and other exogenous substances: Secondary | ICD-10-CM

## 2014-07-18 DIAGNOSIS — E785 Hyperlipidemia, unspecified: Secondary | ICD-10-CM

## 2014-07-18 DIAGNOSIS — I5032 Chronic diastolic (congestive) heart failure: Secondary | ICD-10-CM

## 2014-07-18 DIAGNOSIS — N183 Chronic kidney disease, stage 3 unspecified: Secondary | ICD-10-CM

## 2014-07-18 DIAGNOSIS — T462X1A Poisoning by other antidysrhythmic drugs, accidental (unintentional), initial encounter: Principal | ICD-10-CM

## 2014-07-18 DIAGNOSIS — I480 Paroxysmal atrial fibrillation: Secondary | ICD-10-CM

## 2014-07-18 DIAGNOSIS — I4891 Unspecified atrial fibrillation: Secondary | ICD-10-CM

## 2014-07-18 DIAGNOSIS — Z23 Encounter for immunization: Secondary | ICD-10-CM

## 2014-07-18 LAB — TSH: TSH: 24.24 u[IU]/mL — ABNORMAL HIGH (ref 0.35–4.50)

## 2014-07-18 LAB — T4, FREE: Free T4: 0.89 ng/dL (ref 0.60–1.60)

## 2014-07-18 NOTE — Progress Notes (Signed)
Pre visit review using our clinic review tool, if applicable. No additional management support is needed unless otherwise documented below in the visit note. 

## 2014-07-18 NOTE — Patient Instructions (Signed)
We will check on your thyroid medicine today.  If you are doing well and have no problems you can wait 6 months to 1 year to come back. If you are having problems or feel sick you can come back sooner.

## 2014-07-19 ENCOUNTER — Telehealth: Payer: Self-pay | Admitting: Internal Medicine

## 2014-07-19 NOTE — Assessment & Plan Note (Signed)
She appears to be stable. Reviewed recent labs from 07/11/14.

## 2014-07-19 NOTE — Assessment & Plan Note (Signed)
She takes apixiban, amiodarone.

## 2014-07-19 NOTE — Telephone Encounter (Signed)
Returning your phone call.

## 2014-07-19 NOTE — Assessment & Plan Note (Signed)
Continue to monitor. Consider checking lipid panel at next visit.

## 2014-07-19 NOTE — Assessment & Plan Note (Signed)
Checked TSH and free T4.

## 2014-07-19 NOTE — Progress Notes (Signed)
   Subjective:    Patient ID: [name], female    DOB: [birth year], 79 y.o.   MRN: [id number]  HPI The patient is a very pleasant 80 YO female. She has PMH of CHF (diastolic), CKD, A fib, hypothyroidism (due to amiodarone), hx breast cancer, mitral regurgitation. She is accompanied by her daughter and her son today and together they all provide the history. She has a skin tear on her arm which her daughter has been caring for with antibiotic cream and covering. It did not bleed much although she is on eliquis. She does not walk at all and is able to transfer with assistance. She is able to see fairly well still and enjoys watching her neighbors come and go and watches whatever is on tv. She is not always able to follow exactly what is going on but she enjoys it. She used to be an avid Systems analyst and word puzzles. She denies any problems with chest pains, SOB, abdominal pain, constipation, diarrhea. She does have have swelling in her legs.   Review of Systems  Constitutional: Negative for fever, chills, activity change, appetite change, fatigue and unexpected weight change.  HENT: Negative.   Eyes: Negative.   Respiratory: Negative for cough, chest tightness, shortness of breath and wheezing.   Cardiovascular: Negative for chest pain, palpitations and leg swelling.  Gastrointestinal: Negative for abdominal pain, diarrhea, constipation and abdominal distention.  Endocrine: Negative for cold intolerance.  Musculoskeletal: Positive for gait problem.  Skin: Positive for wound.  Neurological: Negative for dizziness, syncope, weakness and headaches.       Objective:   Physical Exam  Vitals reviewed. Constitutional: She appears well-developed and well-nourished.  In wheelchair  HENT:  Head: Normocephalic and atraumatic.  Eyes: EOM are normal.  Neck: Normal range of motion.  Cardiovascular: Normal rate and regular rhythm.   Pulmonary/Chest: Effort normal and breath sounds normal. No  respiratory distress. She has no wheezes. She has no rales. She exhibits no tenderness.  Abdominal: Soft. Bowel sounds are normal. She exhibits no distension. There is no tenderness. There is no rebound.  Neurological: Coordination abnormal.  Stands to transfer only.  Skin: Skin is warm and dry.  Skin tear mostly healed left arm 65mm and also another 21mm close together. No swelling or signs of infection.   Filed Vitals:   07/18/14 1431  BP: 100/52  Pulse: 74  Temp: 97.4 F (36.3 C)  TempSrc: Oral  Resp: 14  Height: 4\' 11"  (1.499 m)  Weight: 97 lb (43.999 kg)  SpO2: 98%      Assessment & Plan:

## 2014-07-19 NOTE — Assessment & Plan Note (Addendum)
Appears to be stable today and no signs of volume overload on exam. She is taking 2 lasix in morning and 1 in evening at this time. Has metolazone at home for when fluid is not responding to lasix. Advised to call our office or cardiology if she has problems. She does follow with advanced heart failure clinic.

## 2014-07-24 ENCOUNTER — Other Ambulatory Visit (HOSPITAL_COMMUNITY): Payer: Self-pay

## 2014-07-24 MED ORDER — APIXABAN 2.5 MG PO TABS: 2.5000 mg | ORAL_TABLET | Freq: Two times a day (BID) | ORAL | Status: DC

## 2014-08-11 ENCOUNTER — Other Ambulatory Visit: Payer: Self-pay | Admitting: Internal Medicine

## 2014-08-13 ENCOUNTER — Other Ambulatory Visit: Payer: Self-pay | Admitting: Geriatric Medicine

## 2014-08-13 MED ORDER — LEVOTHYROXINE SODIUM 25 MCG PO TABS: ORAL_TABLET | ORAL | Status: DC

## 2014-08-22 ENCOUNTER — Encounter (HOSPITAL_COMMUNITY): Payer: Self-pay

## 2014-08-22 ENCOUNTER — Ambulatory Visit (HOSPITAL_COMMUNITY)
Admission: RE | Admit: 2014-08-22 | Discharge: 2014-08-22 | Disposition: A | Discharge status: Home / Self Care | Payer: Medicare Other | Source: Ambulatory Visit | Attending: Internal Medicine | Admitting: Internal Medicine

## 2014-08-22 VITALS — BP 111/52 | HR 78 | Resp 18 | Wt 93.1 lb

## 2014-08-22 DIAGNOSIS — E780 Pure hypercholesterolemia: Secondary | ICD-10-CM | POA: Diagnosis not present

## 2014-08-22 DIAGNOSIS — I5032 Chronic diastolic (congestive) heart failure: Secondary | ICD-10-CM | POA: Diagnosis not present

## 2014-08-22 DIAGNOSIS — I340 Nonrheumatic mitral (valve) insufficiency: Secondary | ICD-10-CM | POA: Insufficient documentation

## 2014-08-22 DIAGNOSIS — I4891 Unspecified atrial fibrillation: Secondary | ICD-10-CM | POA: Diagnosis not present

## 2014-08-22 DIAGNOSIS — Z7901 Long term (current) use of anticoagulants: Secondary | ICD-10-CM | POA: Insufficient documentation

## 2014-08-22 DIAGNOSIS — I480 Paroxysmal atrial fibrillation: Secondary | ICD-10-CM

## 2014-08-22 DIAGNOSIS — E039 Hypothyroidism, unspecified: Secondary | ICD-10-CM | POA: Insufficient documentation

## 2014-08-22 DIAGNOSIS — G629 Polyneuropathy, unspecified: Secondary | ICD-10-CM | POA: Insufficient documentation

## 2014-08-22 DIAGNOSIS — I358 Other nonrheumatic aortic valve disorders: Secondary | ICD-10-CM | POA: Insufficient documentation

## 2014-08-22 DIAGNOSIS — I2510 Atherosclerotic heart disease of native coronary artery without angina pectoris: Secondary | ICD-10-CM | POA: Insufficient documentation

## 2014-08-22 DIAGNOSIS — I080 Rheumatic disorders of both mitral and aortic valves: Secondary | ICD-10-CM

## 2014-08-22 LAB — BASIC METABOLIC PANEL
ANION GAP: 11 (ref 5–15)
BUN: 19 mg/dL (ref 6–23)
CO2: 32 meq/L (ref 19–32)
CREATININE: 1.09 mg/dL (ref 0.50–1.10)
Calcium: 9.3 mg/dL (ref 8.4–10.5)
Chloride: 95 mEq/L — ABNORMAL LOW (ref 96–112)
GFR calc Af Amer: 49 mL/min — ABNORMAL LOW (ref >90)
GFR calc non Af Amer: 42 mL/min — ABNORMAL LOW (ref >90)
GLUCOSE: 100 mg/dL — AB (ref 70–99)
Potassium: 4 mEq/L (ref 3.7–5.3)
Sodium: 138 mEq/L (ref 137–147)

## 2014-08-22 MED ORDER — AMIODARONE HCL 100 MG PO TABS: 100.0000 mg | ORAL_TABLET | Freq: Every day | ORAL | Status: DC

## 2014-08-22 NOTE — Patient Instructions (Signed)
Doing great.  Decrease your Amiodarone to 100 mg daily. Your new prescription will be 100 mg tablets.  Have a wonderful New Year and [place].  Call any issues.  Do the following things EVERYDAY: 1) Weigh yourself in the morning before breakfast. Write it down and keep it in a log. 2) Take your medicines as prescribed 3) Eat low salt foods-Limit salt (sodium) to 2000 mg per day.  4) Stay as active as you can everyday 5) Limit all fluids for the day to less than 2 liters 6)

## 2014-08-22 NOTE — Progress Notes (Signed)
Patient ID: DEIONDRA DENLEY, female   DOB: [birth year], 80 y.o.   MRN: [id number]  Primary Cardiologist: Dr. Angelena Form PCP: Dr. Linda Hedges   HPI: Ms. Alas is a 80 y.o. female with history of diastolic heart failure, right heart failure, atrial fibrillation, severe mitral regurgitation not surgical candidate, HL and hypothyroidism.      Echo 08/28/12: LVEF 55-60%.  Mild AI, Severe MR.  LA Mod to severely dilated.  PFO/small ASD.  PAPP 70 mmHg.   Echo 12/25/13: EF 55-60%, grade II DD, mild/mod AI, mod/severe MR, LA severely dilated, RV mod/severely dilated and sys fx mod/severely reduced, mod TR  Patient has been admitted 4 times in last 6 months for A/C HF and A/C RF. Family met with Palliative Care. Last d/c weight 94 lbs.   Follow up for Heart Failure: Doing well. Since last visit has needed metolazone 1.25 mg x 1 for weight gain. Weight at home 90-93 lbs. Denies SOB, orthopnea, CP or edema. Appetite comes and goes.   Last visit started metolazone 2.5 mg PRN for weight greater than 97 lbs,  however with one dose lost 6 lbs and got very weak. Family is talking with Sable Feil from [place]. Weight back up to 95 lbs. Denies SOB with sitting but gets pretty SOB with movement. Denies CP, PND or edema. + orthopnea sleeping in a hospital bed. Getting weaker. Appetite good for patient. Following a low salt diet and drinking less than 2L a day. In the chair most of the day.   SH: Lives with her daughter.   ROS: All systems negative except as listed in HPI, PMH and Problem List.  Past Medical History  Diagnosis Date  . Coronary artery disease   . Peripheral neuropathy   . Aortic insufficiency     mild  . Cerumen impaction   . Unspecified diastolic heart failure   . Hyperlipemia   . Mitral valve prolapse     "severe" (09/22/2012)  . Mitral regurgitation     severe  . Urosepsis   . Anemia, unspecified   . Osteoporosis   . Adenocarcinoma, breast   . Irritable bladder   . Urinary  incontinence   . Hypothyroidism   . H/O: whooping cough   . Hypercholesterolemia     "stopped statins ~ 2 yr ago" (09/22/2012)  . Heart murmur   . CHF (congestive heart failure)   . Shortness of breath     "basically all the time; worse w/exertion" (09/22/2012)  . Bleeding hemorrhoids ~ 2011  . Osteoarthritis     "knees, hips, hands" (09/22/2012)    Current Outpatient Prescriptions  Medication Sig Dispense Refill  . ALPRAZolam (XANAX) 0.25 MG tablet Take 0.5 tablets (0.125 mg total) by mouth 3 (three) times daily as needed for anxiety.  60 tablet  2  . amiodarone (PACERONE) 200 MG tablet Take 1 tablet (200 mg total) by mouth daily.  30 tablet  3  . apixaban (ELIQUIS) 2.5 MG TABS tablet Take 1 tablet (2.5 mg total) by mouth 2 (two) times daily. 10am and 6:30pm  60 tablet  3  . cycloSPORINE (RESTASIS) 0.05 % ophthalmic emulsion Place 1 drop into both eyes 2 (two) times daily.       . furosemide (LASIX) 40 MG tablet Take 60 mg by mouth 2 (two) times daily. 10am and 4pm      . guaiFENesin (MUCINEX) 600 MG 12 hr tablet Take 600 mg by mouth daily.       [name] levothyroxine (  SYNTHROID, LEVOTHROID) 25 MCG tablet take 1 tablet by mouth once daily BEFORE BREAKFAST  30 tablet  2  . metolazone (ZAROXOLYN) 2.5 MG tablet Take 0.5 tablets (1.25 mg total) by mouth as needed.  4 tablet  3  . Multiple Vitamin (MULTIVITAMIN WITH MINERALS) TABS tablet Take 1 tablet by mouth every evening. 4pm      . Multiple Vitamins-Minerals (PRESERVISION AREDS 2 PO) Take 2 tablets by mouth daily.      . nitrofurantoin (MACRODANTIN) 50 MG capsule Take 50 mg by mouth at bedtime.      Vladimir Faster Glycol-Propyl Glycol (SYSTANE) 0.4-0.3 % GEL Place 1 drop into both eyes at bedtime.      . Polyvinyl Alcohol-Povidone (FRESHKOTE OP) Place 1 drop into both eyes 4 (four) times daily.       . potassium chloride (MICRO-K) 10 MEQ CR capsule Take 4 capsules (40 mEq total) by mouth daily.  120 capsule  3   No current facility-administered  medications for this encounter.    Filed Vitals:   08/22/14 1515  BP: 111/52  Pulse: 78  Resp: 18  Weight: 93 lb 2 oz (42.241 kg)  SpO2: 98%   PHYSICAL EXAM: General:  Elderly in wheelchair. No resp difficulty son and daughter present HEENT: normal Neck: supple. JVP 7 with prominent CV waves. Carotids 2+ bilaterally; no bruits. No lymphadenopathy or thryomegaly appreciated. Cor: s/p L mastectomy. PMI normal. Regular rate & regular rhythm. 3/6 MR murmur. No rubs, gallops. Lungs: clear on 3 liters [place] Abdomen: soft, nontender, nondistended. No hepatosplenomegaly. No bruits or masses. Good bowel sounds. Extremities: no cyanosis, clubbing, rash, trace bilateral edema Neuro: alert & orientedx3, cranial nerves grossly intact. Moves all 4 extremities w/o difficulty. Affect pleasant.   ASSESSMENT & PLAN:  1) Chronic diastolic HF:  - NYHA III symptoms and volume status stable. Will continue lasix 60 mg BID and metolazone 1.25 mg PRN for weight > 97 lbs.. It is going to be a balancing act trying to keep the fluid off and keeping her asymptomatic and at the same time not drying her out too much.  - HR better with discontinuation of coreg.   - The family has met with Sharyn Lull from [place] and have encouraged them to continue to meet with her for goals of care and symptom management. Patient does not seem to be in any discomfort and reports she is not having any pain.  - Check BMET today.  2) Atrial Fibrillation - Appears to be in NSR today. She is having increased tremors in hands and legs. Will cut amiodarone back to 100 mg daily.    F/U 2 months [name] NP-C 3:19 PM

## 2014-10-09 ENCOUNTER — Other Ambulatory Visit (HOSPITAL_COMMUNITY): Payer: Self-pay | Admitting: Cardiology

## 2014-10-09 DIAGNOSIS — I5031 Acute diastolic (congestive) heart failure: Secondary | ICD-10-CM

## 2014-10-09 MED ORDER — POTASSIUM CHLORIDE ER 10 MEQ PO CPCR: 40.0000 meq | ORAL_CAPSULE | Freq: Every day | ORAL | Status: DC

## 2014-10-14 ENCOUNTER — Ambulatory Visit (HOSPITAL_COMMUNITY)
Admission: RE | Admit: 2014-10-14 | Discharge: 2014-10-14 | Disposition: A | Discharge status: Home / Self Care | Payer: Medicare Other | Source: Ambulatory Visit | Attending: Internal Medicine | Admitting: Internal Medicine

## 2014-10-14 VITALS — BP 101/47 | HR 72 | Resp 18 | Wt 94.5 lb

## 2014-10-14 DIAGNOSIS — I5032 Chronic diastolic (congestive) heart failure: Secondary | ICD-10-CM | POA: Diagnosis not present

## 2014-10-14 DIAGNOSIS — I480 Paroxysmal atrial fibrillation: Secondary | ICD-10-CM

## 2014-10-14 DIAGNOSIS — I2510 Atherosclerotic heart disease of native coronary artery without angina pectoris: Secondary | ICD-10-CM | POA: Diagnosis not present

## 2014-10-14 DIAGNOSIS — R011 Cardiac murmur, unspecified: Secondary | ICD-10-CM | POA: Insufficient documentation

## 2014-10-14 DIAGNOSIS — E780 Pure hypercholesterolemia: Secondary | ICD-10-CM | POA: Insufficient documentation

## 2014-10-14 DIAGNOSIS — I358 Other nonrheumatic aortic valve disorders: Secondary | ICD-10-CM | POA: Insufficient documentation

## 2014-10-14 DIAGNOSIS — Z7901 Long term (current) use of anticoagulants: Secondary | ICD-10-CM | POA: Insufficient documentation

## 2014-10-14 DIAGNOSIS — N183 Chronic kidney disease, stage 3 unspecified: Secondary | ICD-10-CM

## 2014-10-14 DIAGNOSIS — Z79899 Other long term (current) drug therapy: Secondary | ICD-10-CM | POA: Insufficient documentation

## 2014-10-14 DIAGNOSIS — M810 Age-related osteoporosis without current pathological fracture: Secondary | ICD-10-CM | POA: Insufficient documentation

## 2014-10-14 DIAGNOSIS — D649 Anemia, unspecified: Secondary | ICD-10-CM | POA: Insufficient documentation

## 2014-10-14 DIAGNOSIS — E039 Hypothyroidism, unspecified: Secondary | ICD-10-CM | POA: Diagnosis not present

## 2014-10-14 DIAGNOSIS — I341 Nonrheumatic mitral (valve) prolapse: Secondary | ICD-10-CM | POA: Diagnosis not present

## 2014-10-14 DIAGNOSIS — I4891 Unspecified atrial fibrillation: Secondary | ICD-10-CM | POA: Diagnosis not present

## 2014-10-14 DIAGNOSIS — I340 Nonrheumatic mitral (valve) insufficiency: Secondary | ICD-10-CM | POA: Insufficient documentation

## 2014-10-14 DIAGNOSIS — G629 Polyneuropathy, unspecified: Secondary | ICD-10-CM | POA: Insufficient documentation

## 2014-10-14 DIAGNOSIS — C50919 Malignant neoplasm of unspecified site of unspecified female breast: Secondary | ICD-10-CM | POA: Diagnosis not present

## 2014-10-14 LAB — BASIC METABOLIC PANEL
Anion gap: 9 (ref 5–15)
BUN: 18 mg/dL (ref 6–23)
CO2: 33 meq/L — AB (ref 19–32)
CREATININE: 1.1 mg/dL (ref 0.50–1.10)
Calcium: 9.1 mg/dL (ref 8.4–10.5)
Chloride: 97 mEq/L (ref 96–112)
GFR calc Af Amer: 48 mL/min — ABNORMAL LOW (ref >90)
GFR calc non Af Amer: 42 mL/min — ABNORMAL LOW (ref >90)
Glucose, Bld: 105 mg/dL — ABNORMAL HIGH (ref 70–99)
Potassium: 3.7 mEq/L (ref 3.7–5.3)
Sodium: 139 mEq/L (ref 137–147)

## 2014-10-14 NOTE — Patient Instructions (Signed)
Doing great.  Have a wonderful Christmas and New Years.   If your heart rate increases more than 100 ok to give an extra 100 mg of amiodarone. If heart rate stays greater than 120 call the clinic if during hours or come to the Emergency Department.   Follow up in 2 months  Do the following things EVERYDAY: 1) Weigh yourself in the morning before breakfast. Write it down and keep it in a log. 2) Take your medicines as prescribed 3) Eat low salt foods-Limit salt (sodium) to 2000 mg per day.  4) Stay as active as you can everyday 5) Limit all fluids for the day to less than 2 liters 6)

## 2014-10-14 NOTE — Progress Notes (Signed)
Patient ID: [name], female   DOB: Jul 10, 1918, 80 y.o.   MRN: [id number]  Primary Cardiologist: Dr. Angelena Form PCP: Dr. Linda Hedges   HPI: [name] is a 80 y.o. female with history of diastolic heart failure, right heart failure, atrial fibrillation, severe mitral regurgitation not surgical candidate, HL and hypothyroidism.      Echo 08/28/12: LVEF 55-60%.  Mild AI, Severe MR.  LA Mod to severely dilated.  PFO/small ASD.  PAPP 70 mmHg.   Echo 12/25/13: EF 55-60%, grade II DD, mild/mod AI, mod/severe MR, LA severely dilated, RV mod/severely dilated and sys fx mod/severely reduced, mod TR  Patient has been admitted 4 times in last 6 months for A/C HF and A/C RF. Family met with Palliative Care. Last d/c weight 94 lbs.   Follow up for Heart Failure: Last visit cut amiodarone back to 100 mg daily which helped with the tremors, however had one episode before Thanksgiving when she had acute SOB and daughter took HR and was jumping from 70-110s. Called Palliative Care and told ok if HR stayed less than 120 if not come to ED. Took one extra amiodarone and at 5:30 the next morning back to normal. HR at home now 60-70s. Denies SOB, PND or CP. Has not needed any metolazone since last visit and weight has been steady 89-91 lbs. Appetite doing ok. Trying to follow a low salt diet and drinking less than 2L a day.    SH: Lives with her daughter.   ROS: All systems negative except as listed in HPI, PMH and Problem List.  Past Medical History  Diagnosis Date  . Coronary artery disease   . Peripheral neuropathy   . Aortic insufficiency     mild  . Cerumen impaction   . Unspecified diastolic heart failure   . Hyperlipemia   . Mitral valve prolapse     "severe" (09/22/2012)  . Mitral regurgitation     severe  . Urosepsis   . Anemia, unspecified   . Osteoporosis   . Adenocarcinoma, breast   . Irritable bladder   . Urinary incontinence   . Hypothyroidism   . H/O: whooping cough   .  Hypercholesterolemia     "stopped statins ~ 2 yr ago" (09/22/2012)  . Heart murmur   . CHF (congestive heart failure)   . Shortness of breath     "basically all the time; worse w/exertion" (09/22/2012)  . Bleeding hemorrhoids ~ 2011  . Osteoarthritis     "knees, hips, hands" (09/22/2012)    Current Outpatient Prescriptions  Medication Sig Dispense Refill  . ALPRAZolam (XANAX) 0.25 MG tablet Take 0.5 tablets (0.125 mg total) by mouth 3 (three) times daily as needed for anxiety. 60 tablet 2  . amiodarone (PACERONE) 100 MG tablet Take 1 tablet (100 mg total) by mouth daily. 30 tablet 3  . apixaban (ELIQUIS) 2.5 MG TABS tablet Take 1 tablet (2.5 mg total) by mouth 2 (two) times daily. 10am and 6:30pm 60 tablet 3  . cycloSPORINE (RESTASIS) 0.05 % ophthalmic emulsion Place 1 drop into both eyes 2 (two) times daily.     . furosemide (LASIX) 40 MG tablet Take 60 mg by mouth 2 (two) times daily. 10am and 4pm    . guaiFENesin (MUCINEX) 600 MG 12 hr tablet Take 600 mg by mouth daily.     [name] levothyroxine (SYNTHROID, LEVOTHROID) 25 MCG tablet take 1 tablet by mouth once daily BEFORE BREAKFAST 30 tablet 2  . metolazone (ZAROXOLYN) 2.5 MG tablet  Take 0.5 tablets (1.25 mg total) by mouth as needed. 4 tablet 3  . Multiple Vitamin (MULTIVITAMIN WITH MINERALS) TABS tablet Take 1 tablet by mouth every evening. 4pm    . Multiple Vitamins-Minerals (PRESERVISION AREDS 2 PO) Take 2 tablets by mouth daily.    . nitrofurantoin (MACRODANTIN) 50 MG capsule Take 50 mg by mouth at bedtime.    [name] Glycol-Propyl Glycol (SYSTANE) 0.4-0.3 % GEL Place 1 drop into both eyes at bedtime.    . Polyvinyl Alcohol-Povidone (FRESHKOTE OP) Place 1 drop into both eyes 4 (four) times daily.     . potassium chloride (MICRO-K) 10 MEQ CR capsule Take 4 capsules (40 mEq total) by mouth daily. 120 capsule 3   No current facility-administered medications for this encounter.    Filed Vitals:   10/14/14 1537  BP: 101/47  Pulse:  72  Resp: 18  Weight: 94 lb 8 oz (42.865 kg)  SpO2: 98%   PHYSICAL EXAM: General:  Elderly in wheelchair. No resp difficulty son and daughter present HEENT: normal Neck: supple. JVP 7 with prominent CV waves. Carotids 2+ bilaterally; no bruits. No lymphadenopathy or thryomegaly appreciated. Cor: s/p L mastectomy. PMI normal. Regular rate & regular rhythm. 3/6 MR murmur. No rubs, gallops. Lungs: clear on 3 liters [name] Abdomen: soft, nontender, nondistended. No hepatosplenomegaly. No bruits or masses. Good bowel sounds. Extremities: no cyanosis, clubbing, rash, trace bilateral edema Neuro: alert & orientedx3, cranial nerves grossly intact. Moves all 4 extremities w/o difficulty. Affect pleasant.   ASSESSMENT & PLAN:  1) Chronic diastolic HF:  - NYHA III symptoms and volume status stable. Will continue lasix 60 mg BID and metolazone 1.25 mg PRN for weight > 97 lbs.. It is going to be a balancing act trying to keep the fluid off and keeping her asymptomatic and at the same time not drying her out too much.  - SBP stable.  - Continue to follow up with [name] from palliative care as needed. She is helping them with goals of care and symptom management. Biggest goal is to make sure patient is comfortable with no pain.  - Check BMET today.  2) Atrial Fibrillation - Appears to be in NSR today. Had one episode with acute SOB and sounds as if she went back into atrial fibrillation. Tremors much improved with decrease in Amiodarone. Told to continue to follow and if rate increases to >120 to call clinic or come to ED if having SOB.   F/U 2 months or as needed Julia Bame B NP-C 3:46 PM

## 2014-10-15 ENCOUNTER — Other Ambulatory Visit (HOSPITAL_COMMUNITY): Payer: Self-pay | Admitting: Cardiology

## 2014-10-15 MED ORDER — FUROSEMIDE 40 MG PO TABS: 60.0000 mg | ORAL_TABLET | Freq: Two times a day (BID) | ORAL | Status: DC

## 2014-11-07 ENCOUNTER — Other Ambulatory Visit: Payer: Self-pay | Admitting: Internal Medicine

## 2014-11-11 ENCOUNTER — Telehealth: Payer: Self-pay | Admitting: Internal Medicine

## 2014-11-13 NOTE — Telephone Encounter (Signed)
Pt daughter called in requesting refill on   levothyroxine (SYNTHROID, LEVOTHROID) 25 MCG tablet

## 2014-11-13 NOTE — Telephone Encounter (Signed)
Refill has been sent back to [facility]...[name]

## 2014-11-14 ENCOUNTER — Other Ambulatory Visit (HOSPITAL_COMMUNITY): Payer: Self-pay | Admitting: Cardiology

## 2014-11-14 DIAGNOSIS — I5022 Chronic systolic (congestive) heart failure: Secondary | ICD-10-CM

## 2014-11-14 MED ORDER — APIXABAN 2.5 MG PO TABS: 2.5000 mg | ORAL_TABLET | Freq: Two times a day (BID) | ORAL | Status: DC

## 2014-11-29 ENCOUNTER — Telehealth (HOSPITAL_COMMUNITY): Payer: Self-pay | Admitting: Vascular Surgery

## 2014-11-29 NOTE — Telephone Encounter (Signed)
Open in error

## 2014-12-02 ENCOUNTER — Other Ambulatory Visit (HOSPITAL_COMMUNITY): Payer: Self-pay | Admitting: *Deleted

## 2014-12-02 MED ORDER — ALPRAZOLAM 0.25 MG PO TABS: 0.1250 mg | ORAL_TABLET | Freq: Three times a day (TID) | ORAL | Status: DC | PRN

## 2014-12-02 NOTE — Telephone Encounter (Signed)
Pt's son given prescription

## 2014-12-11 IMAGING — CR DG THORACIC SPINE 3V
3 series · 3 of 3 positions shown · non-contrast
Comparison: Portable chest 04/21/2014

CLINICAL DATA: Mid back pain.

EXAM:
THORACIC SPINE - 2 VIEW + SWIMMERS

[t thoracic spine ap]
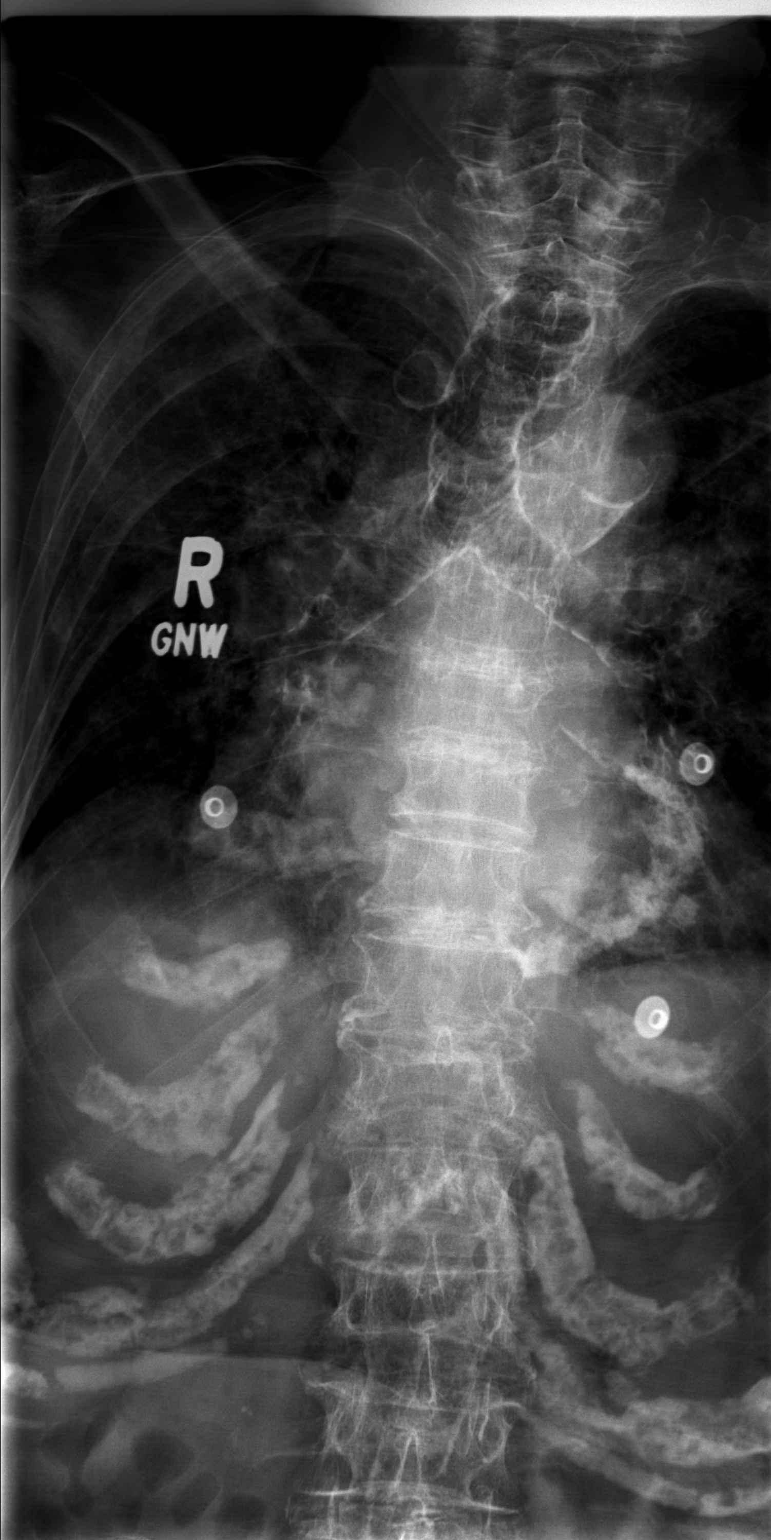

[t thoracic spine lat]
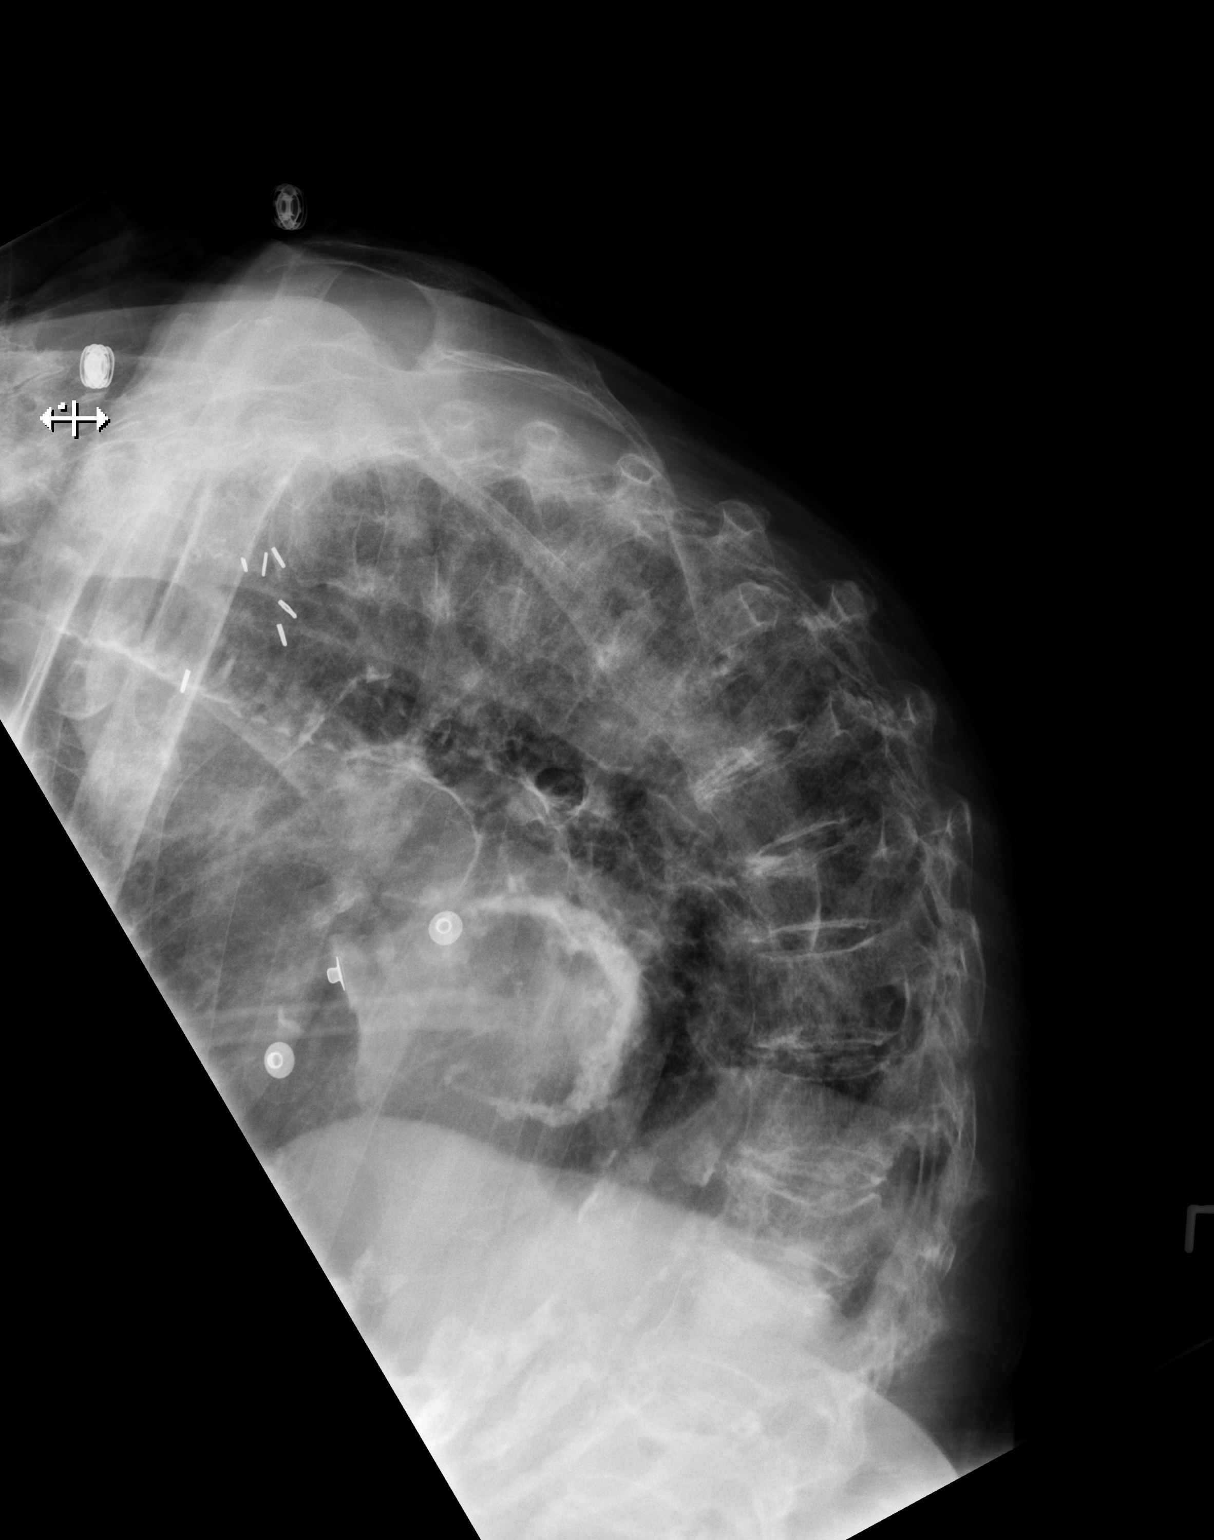

[t thoracic swimmers]
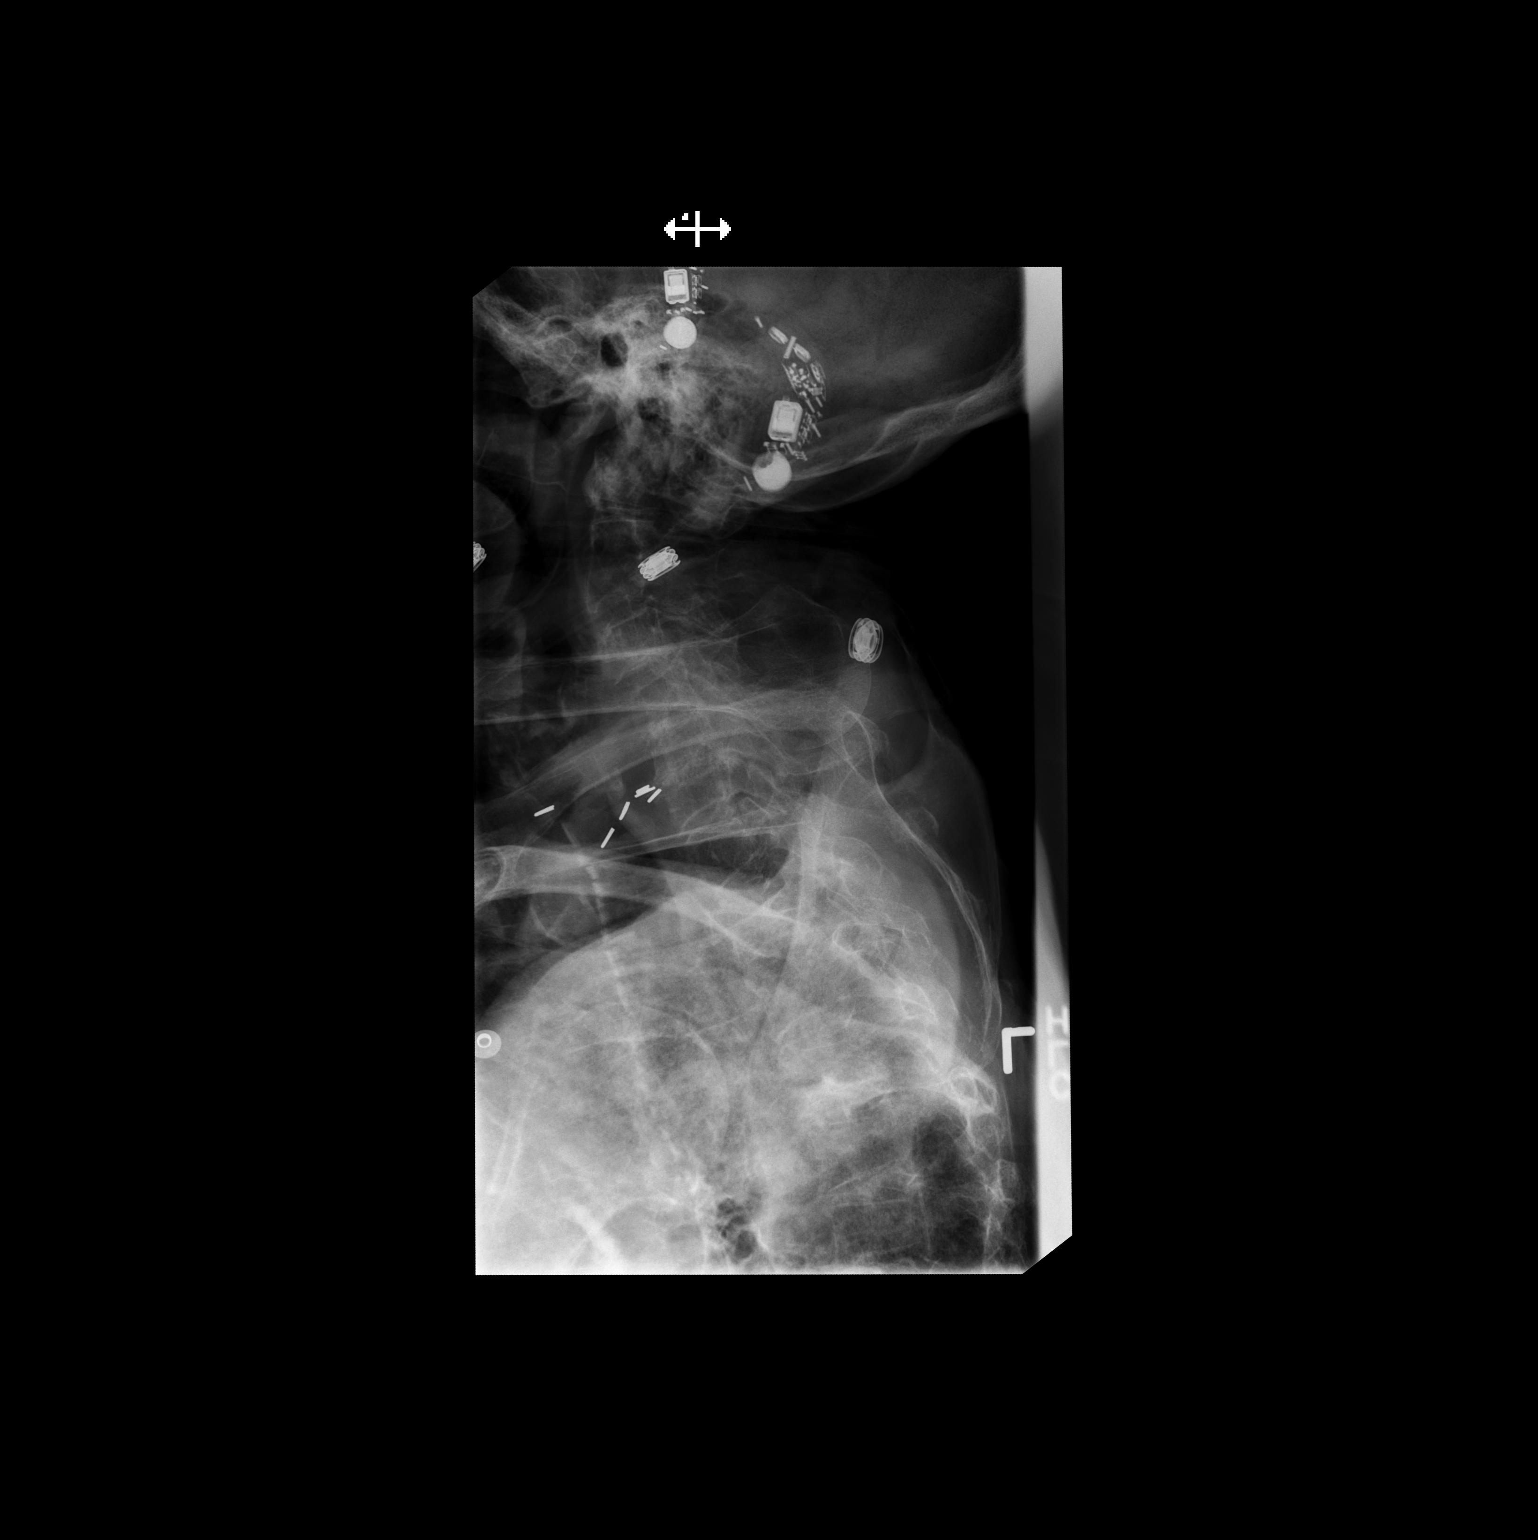

[3 of 3 positions shown; findings below may reference images not displayed]

FINDINGS: Diffuse bone demineralization. Compression deformities of multiple
mid and lower thoracic vertebra with associated thoracic kyphosis.
Appearances are age indeterminate but there probably been some
progression of vertebral narrowing since previous lateral chest
radiograph from 09/22/2012. No anterior subluxation. Calcified and
tortuous aorta. Calcification in the mitral valve annulus.
IMPRESSION: Diffuse bone demineralization with multiple thoracic compression
fractures consistent with osteoporosis. Thoracic kyphosis. There has
been progression since [DATE].

## 2014-12-12 ENCOUNTER — Ambulatory Visit (HOSPITAL_COMMUNITY)
Admission: RE | Admit: 2014-12-12 | Discharge: 2014-12-12 | Disposition: A | Discharge status: Home / Self Care | Payer: Medicare Other | Source: Ambulatory Visit | Attending: Internal Medicine | Admitting: Internal Medicine

## 2014-12-12 VITALS — BP 112/58 | HR 78 | Wt 95.8 lb

## 2014-12-12 DIAGNOSIS — I4891 Unspecified atrial fibrillation: Secondary | ICD-10-CM | POA: Diagnosis not present

## 2014-12-12 DIAGNOSIS — I5032 Chronic diastolic (congestive) heart failure: Secondary | ICD-10-CM | POA: Insufficient documentation

## 2014-12-12 DIAGNOSIS — I480 Paroxysmal atrial fibrillation: Secondary | ICD-10-CM

## 2014-12-12 LAB — BASIC METABOLIC PANEL
Anion gap: 9 (ref 5–15)
BUN: 22 mg/dL (ref 6–23)
CO2: 35 mmol/L — ABNORMAL HIGH (ref 19–32)
Calcium: 8.8 mg/dL (ref 8.4–10.5)
Chloride: 94 mmol/L — ABNORMAL LOW (ref 96–112)
Creatinine, Ser: 1.32 mg/dL — ABNORMAL HIGH (ref 0.50–1.10)
GFR calc Af Amer: 39 mL/min — ABNORMAL LOW (ref >90)
GFR, EST NON AFRICAN AMERICAN: 33 mL/min — AB (ref >90)
GLUCOSE: 124 mg/dL — AB (ref 70–99)
POTASSIUM: 3.3 mmol/L — AB (ref 3.5–5.1)
Sodium: 138 mmol/L (ref 135–145)

## 2014-12-12 NOTE — Patient Instructions (Signed)
Labs today  Your physician recommends that you schedule a follow-up appointment in: 2 months  Do the following things EVERYDAY: 1) Weigh yourself in the morning before breakfast. Write it down and keep it in a log. 2) Take your medicines as prescribed 3) Eat low salt foods-Limit salt (sodium) to 2000 mg per day.  4) Stay as active as you can everyday 5) Limit all fluids for the day to less than 2 liters 6)   

## 2014-12-12 NOTE — Progress Notes (Signed)
Patient ID: [name], female   DOB: 10/06/1918, 81 y.o.   MRN: [id number]  Primary Cardiologist: Dr. Angelena Form PCP: Dr. Linda Hedges   HPI: [name] is a 81 y.o. female with history of diastolic heart failure, right heart failure, atrial fibrillation, severe mitral regurgitation not surgical candidate, HL and hypothyroidism.      Echo 08/28/12: LVEF 55-60%.  Mild AI, Severe MR.  LA Mod to severely dilated.  PFO/small ASD.  PAPP 70 mmHg.   Echo 12/25/13: EF 55-60%, grade II DD, mild/mod AI, mod/severe MR, LA severely dilated, RV mod/severely dilated and sys fx mod/severely reduced, mod TR  Last d/c weight 94 lbs.   Follow up for Heart Failure: Here for f/u with her family. Relatively stable. Limited to wheelchair however. Very tired. Weight stable 91-93 pounds. Takes metolazone when hits 93 lbs. BP monitor occasionally picks up AF and she will take an extra amio tablet. Amio previously cut to 100 daily due to severe tremor.  Appetite doing ok. Trying to follow a low salt diet and drinking less than 2L a day.  Rosanne Sack from [facility] following occasionally.   SH: Lives with her son and daughter.   ROS: All systems negative except as listed in HPI, PMH and Problem List.  Past Medical History  Diagnosis Date  . Coronary artery disease   . Peripheral neuropathy   . Aortic insufficiency     mild  . Cerumen impaction   . Unspecified diastolic heart failure   . Hyperlipemia   . Mitral valve prolapse     "severe" (09/22/2012)  . Mitral regurgitation     severe  . Urosepsis   . Anemia, unspecified   . Osteoporosis   . Adenocarcinoma, breast   . Irritable bladder   . Urinary incontinence   . Hypothyroidism   . H/O: whooping cough   . Hypercholesterolemia     "stopped statins ~ 2 yr ago" (09/22/2012)  . Heart murmur   . CHF (congestive heart failure)   . Shortness of breath     "basically all the time; worse w/exertion" (09/22/2012)  . Bleeding hemorrhoids ~ 2011   . Osteoarthritis     "knees, hips, hands" (09/22/2012)    Current Outpatient Prescriptions  Medication Sig Dispense Refill  . ALPRAZolam (XANAX) 0.25 MG tablet Take 0.5 tablets (0.125 mg total) by mouth 3 (three) times daily as needed for anxiety. 60 tablet 2  . amiodarone (PACERONE) 100 MG tablet Take 1 tablet (100 mg total) by mouth daily. 30 tablet 3  . apixaban (ELIQUIS) 2.5 MG TABS tablet Take 1 tablet (2.5 mg total) by mouth 2 (two) times daily. 10am and 6:30pm 60 tablet 3  . cycloSPORINE (RESTASIS) 0.05 % ophthalmic emulsion Place 1 drop into both eyes 2 (two) times daily.     . furosemide (LASIX) 40 MG tablet Take 1.5 tablets (60 mg total) by mouth 2 (two) times daily. 10am and 4pm 90 tablet 3  . guaiFENesin (MUCINEX) 600 MG 12 hr tablet Take 600 mg by mouth daily.     [name] levothyroxine (SYNTHROID, LEVOTHROID) 25 MCG tablet take 1 tablet by mouth once daily BEFORE BREAKFAST 30 tablet 2  . metolazone (ZAROXOLYN) 2.5 MG tablet Take 0.5 tablets (1.25 mg total) by mouth as needed. 4 tablet 3  . Multiple Vitamin (MULTIVITAMIN WITH MINERALS) TABS tablet Take 1 tablet by mouth every evening. 4pm    . Multiple Vitamins-Minerals (PRESERVISION AREDS 2 PO) Take 2 tablets by mouth daily.    [name]  nitrofurantoin (MACRODANTIN) 50 MG capsule Take 50 mg by mouth at bedtime.    Vladimir Faster Glycol-Propyl Glycol (SYSTANE) 0.4-0.3 % GEL Place 1 drop into both eyes at bedtime.    . Polyvinyl Alcohol-Povidone (FRESHKOTE OP) Place 1 drop into both eyes 4 (four) times daily.     . potassium chloride (MICRO-K) 10 MEQ CR capsule Take 4 capsules (40 mEq total) by mouth daily. 120 capsule 3   No current facility-administered medications for this encounter.    Filed Vitals:   12/12/14 1518  BP: 112/58  Pulse: 78  Weight: 95 lb 12.8 oz (43.455 kg)  SpO2: 97%   PHYSICAL EXAM: General:  Elderly in wheelchair. No resp difficulty son and daughter present HEENT: normal Neck: supple. JVP 7 with prominent CV waves.  Carotids 2+ bilaterally; no bruits. No lymphadenopathy or thryomegaly appreciated. Cor: s/p L mastectomy. PMI normal. Regular rate & regular rhythm. 3/6 MR murmur. No rubs, gallops. Lungs: clear on 3 liters [place] Abdomen: soft, nontender, minimally distended. No hepatosplenomegaly. No bruits or masses. Good bowel sounds. Extremities: no cyanosis, clubbing, rash, trace-1+ bilateral edema Neuro: alert & orientedx3, cranial nerves grossly intact. Moves all 4 extremities w/o difficulty. Affect pleasant.   ASSESSMENT & PLAN:  1) Chronic diastolic HF:  - NYHA III symptoms and volume status stable. Will continue lasix 60 mg BID and metolazone 1.25 mg PRN for weight >= 93 lbs. Ideal weight probably 99-91 pounds - SBP stable.  - Continue to follow up with Sharyn Lull from palliative care as needed. She is helping them with goals of care and symptom management. Biggest goal is to make sure patient is comfortable with no pain.  - Check BMET today.  2) Atrial Fibrillation - Appears to be in NSR today. Continue amio 100 daily can take extra as needed . Continue apixaban 2.5 bid. No bleeding.   F/U 2 months or as needed  Glori Bickers MD  3:24 PM

## 2014-12-12 NOTE — Addendum Note (Signed)
Encounter addended by: Kerry Dory, CMA on: 12/12/2014  3:42 PM<BR>     Documentation filed: D[employer], Patient Instructions Section, Orders

## 2014-12-16 ENCOUNTER — Telehealth (HOSPITAL_COMMUNITY): Payer: Self-pay

## 2014-12-16 NOTE — Telephone Encounter (Signed)
Per Dr. Haroldine Laws need to take extra potassium tab x 2 days, then repeat BMET in 1 week.  Left VM for patient to return call.

## 2014-12-23 ENCOUNTER — Other Ambulatory Visit (HOSPITAL_COMMUNITY): Payer: Self-pay | Admitting: Cardiology

## 2014-12-23 ENCOUNTER — Telehealth (HOSPITAL_COMMUNITY): Payer: Self-pay | Admitting: Vascular Surgery

## 2014-12-23 DIAGNOSIS — I4891 Unspecified atrial fibrillation: Secondary | ICD-10-CM

## 2014-12-23 MED ORDER — AMIODARONE HCL 100 MG PO TABS: 100.0000 mg | ORAL_TABLET | Freq: Every day | ORAL | Status: DC

## 2014-12-23 NOTE — Telephone Encounter (Signed)
PT daughter called for [name] , a letter was dropped off about jury duty she needs that letter ASAP.[name] Please advise

## 2014-12-23 NOTE — Telephone Encounter (Signed)
Will have Dr Haroldine Laws do letter ASAP for pt's daughter

## 2014-12-24 NOTE — Telephone Encounter (Signed)
Pt's daughter is aware will have Dr Haroldine Laws do letter ASAP and mail to her

## 2014-12-25 ENCOUNTER — Encounter (HOSPITAL_COMMUNITY): Payer: Self-pay | Admitting: Internal Medicine

## 2014-12-25 NOTE — Telephone Encounter (Signed)
Letter completed by Dr Haroldine Laws and mailed to [facility]

## 2015-02-03 ENCOUNTER — Other Ambulatory Visit (HOSPITAL_COMMUNITY): Payer: Self-pay | Admitting: *Deleted

## 2015-02-03 DIAGNOSIS — I5031 Acute diastolic (congestive) heart failure: Secondary | ICD-10-CM

## 2015-02-03 MED ORDER — POTASSIUM CHLORIDE ER 10 MEQ PO CPCR: 40.0000 meq | ORAL_CAPSULE | Freq: Every day | ORAL | Status: DC

## 2015-02-06 ENCOUNTER — Telehealth (HOSPITAL_COMMUNITY): Payer: Self-pay

## 2015-02-06 NOTE — Telephone Encounter (Signed)
Patient's daughter [name] called c/o patient having swelling in feet.  Said that she tried 1.25mg  of metolazone yesterday which took of a little weight but did not reduce swelling in feet.  Advised per Aundra Dubin may try taking 2 whole tablets of furosemide tonight instead of usual 1.5 tablets with an extra potassium tablet.  Advised to keep feet elevated as much as possible.  Aware and agreeable to plan.  Will call clinic back in am if this does not resolve.  Renee Pain

## 2015-02-07 ENCOUNTER — Telehealth (HOSPITAL_COMMUNITY): Payer: Self-pay | Admitting: *Deleted

## 2015-02-07 NOTE — Telephone Encounter (Signed)
[name] stated pt leg edema has gone down since taking the extra lasix and potassium.  She said pts legs begin to swell when legs aren't elevated.  Advised her to keep pts legs elevated and if the swelling does not go down to take an extra lasix and potassium today (per heather)

## 2015-02-17 ENCOUNTER — Encounter (HOSPITAL_COMMUNITY): Payer: Self-pay

## 2015-02-17 ENCOUNTER — Ambulatory Visit (HOSPITAL_COMMUNITY)
Admission: RE | Admit: 2015-02-17 | Discharge: 2015-02-17 | Disposition: A | Discharge status: Home / Self Care | Payer: Medicare Other | Source: Ambulatory Visit | Attending: Cardiology | Admitting: Cardiology

## 2015-02-17 VITALS — BP 98/56 | HR 66 | Wt 95.2 lb

## 2015-02-17 DIAGNOSIS — E039 Hypothyroidism, unspecified: Secondary | ICD-10-CM | POA: Insufficient documentation

## 2015-02-17 DIAGNOSIS — N183 Chronic kidney disease, stage 3 unspecified: Secondary | ICD-10-CM

## 2015-02-17 DIAGNOSIS — I5032 Chronic diastolic (congestive) heart failure: Secondary | ICD-10-CM | POA: Diagnosis not present

## 2015-02-17 DIAGNOSIS — I4891 Unspecified atrial fibrillation: Secondary | ICD-10-CM | POA: Insufficient documentation

## 2015-02-17 DIAGNOSIS — I080 Rheumatic disorders of both mitral and aortic valves: Secondary | ICD-10-CM | POA: Diagnosis not present

## 2015-02-17 DIAGNOSIS — I340 Nonrheumatic mitral (valve) insufficiency: Secondary | ICD-10-CM | POA: Insufficient documentation

## 2015-02-17 DIAGNOSIS — Z7901 Long term (current) use of anticoagulants: Secondary | ICD-10-CM | POA: Diagnosis not present

## 2015-02-17 DIAGNOSIS — I2510 Atherosclerotic heart disease of native coronary artery without angina pectoris: Secondary | ICD-10-CM | POA: Insufficient documentation

## 2015-02-17 DIAGNOSIS — Z79899 Other long term (current) drug therapy: Secondary | ICD-10-CM | POA: Diagnosis not present

## 2015-02-17 DIAGNOSIS — E780 Pure hypercholesterolemia: Secondary | ICD-10-CM | POA: Diagnosis not present

## 2015-02-17 LAB — COMPREHENSIVE METABOLIC PANEL
ALK PHOS: 91 U/L (ref 39–117)
ALT: 12 U/L (ref 0–35)
ANION GAP: 8 (ref 5–15)
AST: 23 U/L (ref 0–37)
Albumin: 2.6 g/dL — ABNORMAL LOW (ref 3.5–5.2)
BUN: 18 mg/dL (ref 6–23)
CALCIUM: 8.4 mg/dL (ref 8.4–10.5)
CO2: 33 mmol/L — ABNORMAL HIGH (ref 19–32)
Chloride: 96 mmol/L (ref 96–112)
Creatinine, Ser: 1.2 mg/dL — ABNORMAL HIGH (ref 0.50–1.10)
GFR calc non Af Amer: 37 mL/min — ABNORMAL LOW (ref >90)
GFR, EST AFRICAN AMERICAN: 43 mL/min — AB (ref >90)
GLUCOSE: 137 mg/dL — AB (ref 70–99)
Potassium: 3.4 mmol/L — ABNORMAL LOW (ref 3.5–5.1)
SODIUM: 137 mmol/L (ref 135–145)
Total Bilirubin: 0.6 mg/dL (ref 0.3–1.2)
Total Protein: 6.6 g/dL (ref 6.0–8.3)

## 2015-02-17 LAB — CBC
HCT: 30.8 % — ABNORMAL LOW (ref 36.0–46.0)
Hemoglobin: 9.7 g/dL — ABNORMAL LOW (ref 12.0–15.0)
MCH: 30.8 pg (ref 26.0–34.0)
MCHC: 31.5 g/dL (ref 30.0–36.0)
MCV: 97.8 fL (ref 78.0–100.0)
Platelets: 250 10*3/uL (ref 150–400)
RBC: 3.15 MIL/uL — AB (ref 3.87–5.11)
RDW: 14.4 % (ref 11.5–15.5)
WBC: 8.1 10*3/uL (ref 4.0–10.5)

## 2015-02-17 LAB — TSH: TSH: 16.956 u[IU]/mL — ABNORMAL HIGH (ref 0.350–4.500)

## 2015-02-17 NOTE — Patient Instructions (Signed)
Labs today  We will contact you in 3 months to schedule your next appointment.  

## 2015-02-17 NOTE — Progress Notes (Signed)
Patient ID: [name], female   DOB: Feb 26, 1918, 82 y.o.   MRN: [id number] PCP: Dr. Doug Sou  HPI: [name] is a 81 y.o. female with history of diastolic heart failure, right heart failure, atrial fibrillation, severe mitral regurgitation not surgical candidate, HL and hypothyroidism.      Echo 08/28/12: LVEF 55-60%.  Mild AI, Severe MR.  LA Mod to severely dilated.  PFO/small ASD.  PAPP 70 mmHg.   Echo 12/25/13: EF 55-60%, grade II DD, mild/mod AI, mod/severe MR, LA severely dilated, RV mod/severely dilated and sys fx mod/severely reduced, mod TR  Last d/c weight 94 lbs.   Follow up for Heart Failure: Here for f/u with her family. Relatively stable. She remains limited to wheelchair. Very fatigued in general. Weight stable at home and in the office. Takes metolazone when hits 93 lbs, typically every 1.5 weeks. BP monitor occasionally picks up AF and she will take an extra amiodarone tablet. Amiodarone previously cut to 100 daily due to severe tremor.  Very minimal walking, family helps with transfers.  No dyspnea at rest. Dyspnea with bathing.  Sleeps in hospital bed with head of bed raised.   SH: Lives with her son and daughter.   ROS: All systems negative except as listed in HPI, PMH and Problem List.  Past Medical History  Diagnosis Date  . Coronary artery disease   . Peripheral neuropathy   . Aortic insufficiency     mild  . Cerumen impaction   . Unspecified diastolic heart failure   . Hyperlipemia   . Mitral valve prolapse     "severe" (09/22/2012)  . Mitral regurgitation     severe  . Urosepsis   . Anemia, unspecified   . Osteoporosis   . Adenocarcinoma, breast   . Irritable bladder   . Urinary incontinence   . Hypothyroidism   . H/O: whooping cough   . Hypercholesterolemia     "stopped statins ~ 2 yr ago" (09/22/2012)  . Heart murmur   . CHF (congestive heart failure)   . Shortness of breath     "basically all the time; worse w/exertion" (09/22/2012)  .  Bleeding hemorrhoids ~ 2011  . Osteoarthritis     "knees, hips, hands" (09/22/2012)    Current Outpatient Prescriptions  Medication Sig Dispense Refill  . ALPRAZolam (XANAX) 0.25 MG tablet Take 0.5 tablets (0.125 mg total) by mouth 3 (three) times daily as needed for anxiety. 60 tablet 2  . amiodarone (PACERONE) 100 MG tablet Take 1 tablet (100 mg total) by mouth daily. 30 tablet 3  . apixaban (ELIQUIS) 2.5 MG TABS tablet Take 1 tablet (2.5 mg total) by mouth 2 (two) times daily. 10am and 6:30pm 60 tablet 3  . cycloSPORINE (RESTASIS) 0.05 % ophthalmic emulsion Place 1 drop into both eyes 2 (two) times daily.     . furosemide (LASIX) 40 MG tablet Take 1.5 tablets (60 mg total) by mouth 2 (two) times daily. 10am and 4pm 90 tablet 3  . guaiFENesin (MUCINEX) 600 MG 12 hr tablet Take 600 mg by mouth daily.     [name] levothyroxine (SYNTHROID, LEVOTHROID) 25 MCG tablet take 1 tablet by mouth once daily BEFORE BREAKFAST 30 tablet 2  . metolazone (ZAROXOLYN) 2.5 MG tablet Take 0.5 tablets (1.25 mg total) by mouth as needed. 4 tablet 3  . Multiple Vitamins-Minerals (CENTRUM SILVER) CHEW Chew 1 tablet by mouth daily.    . nitrofurantoin (MACRODANTIN) 50 MG capsule Take 50 mg by mouth at bedtime.    [name]  Polyethyl Glycol-Propyl Glycol (SYSTANE) 0.4-0.3 % GEL Place 1 drop into both eyes at bedtime.    . Polyvinyl Alcohol-Povidone (FRESHKOTE OP) Place 1 drop into both eyes 4 (four) times daily.     . potassium chloride (MICRO-K) 10 MEQ CR capsule Take 4 capsules (40 mEq total) by mouth daily. 120 capsule 3   No current facility-administered medications for this encounter.    Filed Vitals:   02/17/15 1521  BP: 98/56  Pulse: 66  Weight: 95 lb 4 oz (43.205 kg)  SpO2: 94%   PHYSICAL EXAM: General:  Elderly in wheelchair. No resp difficulty son and daughter present HEENT: normal Neck: supple. JVP 8. Carotids 2+ bilaterally; no bruits. No lymphadenopathy or thryomegaly appreciated. Cor: s/p L mastectomy. PMI  normal. Regular rate & regular rhythm. 3/6 MR murmur. No rubs, gallops. Lungs: crackles at bases bilaterally.  Abdomen: soft, nontender, minimally distended. No hepatosplenomegaly. No bruits or masses. Good bowel sounds. Extremities: no cyanosis, clubbing, rash. 1+ ankle edema.  Neuro: alert & orientedx3, cranial nerves grossly intact. Moves all 4 extremities w/o difficulty. Affect pleasant.   ASSESSMENT & PLAN:  1) Chronic diastolic HF:   Stable NYHA III symptoms and volume status stable. Will continue lasix 60 mg BID and metolazone 1.25 mg PRN for weight >= 93 lbs. BP soft but stable.  - Check BMET today.  2) Atrial Fibrillation:  Appears to be in NSR today. Continue amio 100 daily, can take extra as needed . Continue apixaban 2.5 bid. Will check LFTs, TSH, CBC today.    F/U 3 months or as needed  Loralie Champagne MD  02/17/2015

## 2015-02-24 ENCOUNTER — Other Ambulatory Visit (HOSPITAL_COMMUNITY): Payer: Self-pay

## 2015-02-24 ENCOUNTER — Telehealth (HOSPITAL_COMMUNITY): Payer: Self-pay

## 2015-02-24 DIAGNOSIS — I5031 Acute diastolic (congestive) heart failure: Secondary | ICD-10-CM

## 2015-02-24 MED ORDER — POTASSIUM CHLORIDE ER 10 MEQ PO CPCR: ORAL_CAPSULE | ORAL | Status: DC

## 2015-02-24 NOTE — Telephone Encounter (Signed)
Some of fatigue may be due to under-dosing of thyroid med, sent TSH reading to PCP for Levoxyl adjustment.  She can take iron sulfate 325 mg bid to see if that brings up hemoglobin.  Would have her take stool softener (Colace 100 mg bid) also as the iron may constipate her.

## 2015-02-24 NOTE — Telephone Encounter (Signed)
Lab results reviewed with patient's daughter [name] who is patient's full time caregiver.  Per Dr. Aundra Dubin, instructed to have patient take extra 20 meq (2 tablets) of potassium in the afternoon.  Rx updated to preferred pharmacy electronically.  Daughter c/o patient being more fatigued lately, and noticed per MyChart that her hgb was lower than normal (9.7), in October was 11.  Will have Dr. Aundra Dubin review and address if necessary.  Renee Pain

## 2015-02-28 ENCOUNTER — Other Ambulatory Visit (HOSPITAL_COMMUNITY): Payer: Self-pay | Admitting: Cardiology

## 2015-02-28 DIAGNOSIS — I5022 Chronic systolic (congestive) heart failure: Secondary | ICD-10-CM

## 2015-02-28 MED ORDER — APIXABAN 2.5 MG PO TABS: 2.5000 mg | ORAL_TABLET | Freq: Two times a day (BID) | ORAL | Status: DC

## 2015-03-10 ENCOUNTER — Other Ambulatory Visit: Payer: Medicare Other

## 2015-03-10 ENCOUNTER — Encounter: Payer: Self-pay | Admitting: Internal Medicine

## 2015-03-10 ENCOUNTER — Ambulatory Visit (INDEPENDENT_AMBULATORY_CARE_PROVIDER_SITE_OTHER): Payer: Medicare Other | Admitting: Internal Medicine

## 2015-03-10 VITALS — BP 114/58 | HR 111 | Temp 97.8°F | Ht 59.0 in | Wt 97.1 lb

## 2015-03-10 DIAGNOSIS — T462X1A Poisoning by other antidysrhythmic drugs, accidental (unintentional), initial encounter: Secondary | ICD-10-CM

## 2015-03-10 DIAGNOSIS — R300 Dysuria: Secondary | ICD-10-CM | POA: Diagnosis not present

## 2015-03-10 DIAGNOSIS — D649 Anemia, unspecified: Secondary | ICD-10-CM

## 2015-03-10 DIAGNOSIS — N3000 Acute cystitis without hematuria: Secondary | ICD-10-CM | POA: Diagnosis not present

## 2015-03-10 DIAGNOSIS — E032 Hypothyroidism due to medicaments and other exogenous substances: Secondary | ICD-10-CM

## 2015-03-10 DIAGNOSIS — E038 Other specified hypothyroidism: Secondary | ICD-10-CM

## 2015-03-10 LAB — POCT URINALYSIS DIPSTICK
Bilirubin, UA: NEGATIVE
Blood, UA: POSITIVE
GLUCOSE UA: NEGATIVE
Ketones, UA: NEGATIVE
Nitrite, UA: NEGATIVE
Protein, UA: 0.15
Spec Grav, UA: 1.02
Urobilinogen, UA: 4
pH, UA: 5.5

## 2015-03-10 MED ORDER — CIPROFLOXACIN HCL 250 MG PO TABS: 250.0000 mg | ORAL_TABLET | Freq: Two times a day (BID) | ORAL | Status: DC

## 2015-03-10 MED ORDER — LEVOTHYROXINE SODIUM 50 MCG PO TABS: ORAL_TABLET | ORAL | Status: DC

## 2015-03-10 MED ORDER — TEMAZEPAM 15 MG PO CAPS: 15.0000 mg | ORAL_CAPSULE | Freq: Every evening | ORAL | Status: DC | PRN

## 2015-03-10 NOTE — Progress Notes (Signed)
Pre visit review using our clinic review tool, if applicable. No additional management support is needed unless otherwise documented below in the visit note. 

## 2015-03-10 NOTE — Patient Instructions (Signed)
We will check the urine for an infection today.   We will change the xanax to temazepam. This is a medicine that is better for sleep and lasts longer in the body than xanax.   We are increasing the thyroid medicine from 25 mcg daily to 50 mcg daily. I have sent in the new prescription but you can take 2 pills of the ones you have right now until you get the new one.   Come back in about 6-8 weeks to recheck the thyroid levels.

## 2015-03-11 DIAGNOSIS — N390 Urinary tract infection, site not specified: Secondary | ICD-10-CM | POA: Insufficient documentation

## 2015-03-11 NOTE — Assessment & Plan Note (Signed)
Increase synthroid to 50 mcg daily (from 25) and see her back in about 6-8 weeks for recheck labs.

## 2015-03-11 NOTE — Assessment & Plan Note (Signed)
Checked U/A and treated leukocytes with ciprofloxacin for 5 days. Send for culture.

## 2015-03-11 NOTE — Progress Notes (Signed)
   Subjective:    Patient ID: [name], female    DOB: Apr 29, 1918, 81 y.o.   MRN: [id number]  HPI The patient is a 81 YO female who is coming in to discuss labs drawn at her cardiologist. Her TSH was elevated and her Hg slightly lower. She is more tired lately. Denies nosebleeds, dark stools, bruising. She is still taking her synthroid daily. Also is concerned she may have a UTI as she is not able to empty her bladder and is staying on the toilet for a long time trying to pass urine. No fevers or chills. Going on for 1-2 weeks and getting worse.   Review of Systems  Constitutional: Negative for fever, chills, activity change, appetite change, fatigue and unexpected weight change.  Respiratory: Negative for cough, chest tightness, shortness of breath and wheezing.   Cardiovascular: Negative for chest pain, palpitations and leg swelling.  Gastrointestinal: Negative for abdominal pain, diarrhea, constipation and abdominal distention.  Endocrine: Negative for cold intolerance.  Genitourinary: Positive for urgency, frequency and difficulty urinating. Negative for dysuria, hematuria and pelvic pain.  Musculoskeletal: Positive for gait problem.  Neurological: Negative for dizziness, syncope, weakness and headaches.      Objective:   Physical Exam  Constitutional: She appears well-developed and well-nourished.  In wheelchair  HENT:  Head: Normocephalic and atraumatic.  Eyes: EOM are normal.  Neck: Normal range of motion.  Cardiovascular: Normal rate and regular rhythm.   Pulmonary/Chest: Effort normal and breath sounds normal. No respiratory distress. She has no wheezes. She has no rales. She exhibits no tenderness.  Abdominal: Soft. Bowel sounds are normal. She exhibits no distension. There is no tenderness. There is no rebound.  Neurological: Coordination abnormal.  Stands to transfer only.  Skin: Skin is warm and dry.  Vitals reviewed.  Filed Vitals:   03/10/15 1452  BP: 114/58   Pulse: 111  Temp: 97.8 F (36.6 C)  TempSrc: Oral  Height: 4\' 11"  (1.499 m)  Weight: 97 lb 2 oz (44.056 kg)  SpO2: 96%      Assessment & Plan:

## 2015-03-11 NOTE — Assessment & Plan Note (Addendum)
Not significantly different from prior and will not start iron pills as they have constipating effects. She is on NOAC which increases her risk for GI bleeding but as asymptomatic will just recheck CBC in 6-8 weeks when we recheck her thyroid levels.

## 2015-03-12 ENCOUNTER — Telehealth (HOSPITAL_COMMUNITY): Payer: Self-pay | Admitting: *Deleted

## 2015-03-12 ENCOUNTER — Telehealth: Payer: Self-pay | Admitting: Internal Medicine

## 2015-03-12 LAB — URINE CULTURE

## 2015-03-12 NOTE — Telephone Encounter (Signed)
I spoke with the patient's daughter and informed her that the culture is not back yet. Patient's daughter wants to know if it is ok for the patient to take Cipro along with pacerone. They were told Cipro causes heart palpitations by the pharmacist and they are concerned that she should not be taking that medication because she already has a racing heart anyway.

## 2015-03-12 NOTE — Telephone Encounter (Signed)
Pt's daughter called concerned about pt's HR.  She states it has been runnin 117-130s since yesterday.  She did give pt xanax and an extra 100 mg of Amio around 4 pm last night but it didn't help any.  Pt's BP today 95/62, HR 132 wt 93.  She states pt is asymptomatic, denies SOB and palps.  Pt was seen by PCP on Mon and started on Cipro for UA.  Will discuss w/MD and call her back

## 2015-03-12 NOTE — Telephone Encounter (Signed)
Pt daughter called and wants to know the result of the culture?    Best number [phone]

## 2015-03-12 NOTE — Telephone Encounter (Signed)
Per Dr Aundra Dubin increased HR could be from UTI, as long as pt asymptomatic he recommends just watching and continuing meds, pt's daughter aware and agreeable

## 2015-03-13 NOTE — Telephone Encounter (Signed)
Spoke with patient's daughter and informed her that Dr. Doug Sou said Cipro is the best medication for the bacteria.

## 2015-03-13 NOTE — Telephone Encounter (Signed)
Given the particular bacteria she has in the urine this would likely be the best choice. The culture has returned and it is responsive to the cipro.

## 2015-03-26 ENCOUNTER — Other Ambulatory Visit (HOSPITAL_COMMUNITY): Payer: Self-pay | Admitting: *Deleted

## 2015-03-26 MED ORDER — FUROSEMIDE 40 MG PO TABS: 60.0000 mg | ORAL_TABLET | Freq: Two times a day (BID) | ORAL | Status: DC

## 2015-04-03 ENCOUNTER — Telehealth: Payer: Self-pay | Admitting: Internal Medicine

## 2015-04-03 NOTE — Telephone Encounter (Signed)
Patient daughter is calling to advise that she suspects that the patient has a UTI. She is requesting an order be placed for a home-collected sample to be brought in. Patient is unable to travel at this time.

## 2015-04-03 NOTE — Telephone Encounter (Signed)
Pt mother called back in about this?  Jonelle Sidle, can someone call her about this?

## 2015-04-03 NOTE — Telephone Encounter (Signed)
Please advise, thanks.

## 2015-04-07 ENCOUNTER — Telehealth: Payer: Self-pay | Admitting: Internal Medicine

## 2015-04-07 DIAGNOSIS — T462X1A Poisoning by other antidysrhythmic drugs, accidental (unintentional), initial encounter: Principal | ICD-10-CM

## 2015-04-07 DIAGNOSIS — E032 Hypothyroidism due to medicaments and other exogenous substances: Secondary | ICD-10-CM

## 2015-04-07 NOTE — Telephone Encounter (Signed)
Patient was advised to have thyroid rechecked in 6 weeks. The question is should she only go to have labs, or should she come in for an appt? Please call patient to advise and place order for labs if applicable.

## 2015-04-08 NOTE — Telephone Encounter (Signed)
Pt dtr informed that they may go straight to the lab. Dtr will bring pt in July to have this rechecked.

## 2015-04-16 ENCOUNTER — Telehealth (HOSPITAL_COMMUNITY): Payer: Self-pay | Admitting: Cardiology

## 2015-04-16 NOTE — Telephone Encounter (Signed)
[name] states she would feel more comfortable if pt was seen in CHF clinic Will hold off on orders from [facility] and wait for further evaluation on 04/17/15

## 2015-04-16 NOTE — Telephone Encounter (Signed)
She can take her metolazone 1.25 mg prior to am Lasix tomorrow morning and again on Saturday morning.  If she feels better with this, does not have to have acute visit.

## 2015-04-16 NOTE — Telephone Encounter (Signed)
Daughter called shortly after visit with M.Bednar,NP Pt does have increased SOB, LE edema and this is a concern M.Bednar unsure if this is a progression of the dz or an acute flare up  Family would like to have patient evaluated possibly some labs etc  Pt added onto schedule for 04/17/15 @ 80 am  Claudia, pts daughter states getting patient to this appointment maybe difficult as her brother that normally accompanies them is [place] Advised i will speak with provider to see if they would recommenced anything without office visit    Please advise

## 2015-04-17 ENCOUNTER — Encounter (HOSPITAL_COMMUNITY): Payer: Self-pay

## 2015-04-17 ENCOUNTER — Ambulatory Visit (HOSPITAL_COMMUNITY)
Admission: RE | Admit: 2015-04-17 | Discharge: 2015-04-17 | Disposition: A | Discharge status: Home / Self Care | Payer: Medicare Other | Source: Ambulatory Visit | Attending: Internal Medicine | Admitting: Internal Medicine

## 2015-04-17 VITALS — BP 102/60 | HR 72 | Wt 89.0 lb

## 2015-04-17 DIAGNOSIS — I5032 Chronic diastolic (congestive) heart failure: Secondary | ICD-10-CM | POA: Diagnosis present

## 2015-04-17 DIAGNOSIS — I4891 Unspecified atrial fibrillation: Secondary | ICD-10-CM | POA: Insufficient documentation

## 2015-04-17 DIAGNOSIS — I480 Paroxysmal atrial fibrillation: Secondary | ICD-10-CM | POA: Diagnosis not present

## 2015-04-17 DIAGNOSIS — Z66 Do not resuscitate: Secondary | ICD-10-CM | POA: Diagnosis not present

## 2015-04-17 LAB — CBC
HCT: 31.3 % — ABNORMAL LOW (ref 36.0–46.0)
Hemoglobin: 9.6 g/dL — ABNORMAL LOW (ref 12.0–15.0)
MCH: 29.6 pg (ref 26.0–34.0)
MCHC: 30.7 g/dL (ref 30.0–36.0)
MCV: 96.6 fL (ref 78.0–100.0)
PLATELETS: 209 10*3/uL (ref 150–400)
RBC: 3.24 MIL/uL — AB (ref 3.87–5.11)
RDW: 14.2 % (ref 11.5–15.5)
WBC: 6.9 10*3/uL (ref 4.0–10.5)

## 2015-04-17 LAB — BASIC METABOLIC PANEL
Anion gap: 8 (ref 5–15)
BUN: 18 mg/dL (ref 6–20)
CHLORIDE: 98 mmol/L — AB (ref 101–111)
CO2: 33 mmol/L — ABNORMAL HIGH (ref 22–32)
Calcium: 9 mg/dL (ref 8.9–10.3)
Creatinine, Ser: 1.12 mg/dL — ABNORMAL HIGH (ref 0.44–1.00)
GFR calc Af Amer: 47 mL/min — ABNORMAL LOW (ref >60)
GFR, EST NON AFRICAN AMERICAN: 40 mL/min — AB (ref >60)
GLUCOSE: 145 mg/dL — AB (ref 65–99)
POTASSIUM: 3.9 mmol/L (ref 3.5–5.1)
Sodium: 139 mmol/L (ref 135–145)

## 2015-04-17 LAB — TSH: TSH: 4.401 u[IU]/mL (ref 0.350–4.500)

## 2015-04-17 LAB — T4, FREE: Free T4: 1.33 ng/dL — ABNORMAL HIGH (ref 0.61–1.12)

## 2015-04-17 MED ORDER — METOLAZONE 2.5 MG PO TABS: ORAL_TABLET | ORAL | Status: DC

## 2015-04-17 NOTE — Progress Notes (Signed)
Advanced Heart Failure Medication Review by a Pharmacist  Does the patient  feel that his/her medications are working for him/her?  yes  Has the patient been experiencing any side effects to the medications prescribed?  no  Does the patient measure his/her own blood pressure or blood glucose at home?  no   Does the patient have any problems obtaining medications due to transportation or finances?   no  Understanding of regimen: good Understanding of indications: good Potential of compliance: excellent    Pharmacist comments: Patient presents to HF clinic with her caretakers and medications were reviewed with a pharmacist. No medication discrepancies noted at this time. Caretaker helps patient with medications, helped to answer question regarding timing of metolazone 30 mins before furosemide. No other questions at this time.   Megan E. Supple, Pharm.D Clinical Pharmacy Resident Pager: [phone] 04/17/2015 10:30 AM

## 2015-04-17 NOTE — Progress Notes (Signed)
Patient ID: [name], female   DOB: July 21, 1918, 81 y.o.   MRN: [id number] PCP: Dr. Doug Sou  HPI: Ms. [name] is a 81 y.o. female with history of diastolic heart failure, right heart failure, atrial fibrillation, severe mitral regurgitation not surgical candidate, HL and hypothyroidism.      Echo 08/28/12: LVEF 55-60%.  Mild AI, Severe MR.  LA Mod to severely dilated.  PFO/small ASD.  PAPP 70 mmHg.   Echo 12/25/13: EF 55-60%, grade II DD, mild/mod AI, mod/severe MR, LA severely dilated, RV mod/severely dilated and sys fx mod/severely reduced, mod TR  Last d/c weight 94 lbs.   Follow up for Heart Failure: Here for f/u with her daughter. Over past few weeks has gotten worse. More fatigued and dyspneic with any exertion. Have been taking metolazone for weight >=93 pounds. Weight 89-90 pounds now. + edema and abdominal bloating. No episodes of AF since May that they know of. Sleeps in hospital bed with head of bed raised. No bleeding with Eliquis   SH: Lives with her son and daughter.   ROS: All systems negative except as listed in HPI, PMH and Problem List.  Past Medical History  Diagnosis Date  . Coronary artery disease   . Peripheral neuropathy   . Aortic insufficiency     mild  . Cerumen impaction   . Unspecified diastolic heart failure   . Hyperlipemia   . Mitral valve prolapse     "severe" (09/22/2012)  . Mitral regurgitation     severe  . Urosepsis   . Anemia, unspecified   . Osteoporosis   . Adenocarcinoma, breast   . Irritable bladder   . Urinary incontinence   . Hypothyroidism   . H/O: whooping cough   . Hypercholesterolemia     "stopped statins ~ 2 yr ago" (09/22/2012)  . Heart murmur   . CHF (congestive heart failure)   . Shortness of breath     "basically all the time; worse w/exertion" (09/22/2012)  . Bleeding hemorrhoids ~ 2011  . Osteoarthritis     "knees, hips, hands" (09/22/2012)    Current Outpatient Prescriptions  Medication Sig Dispense Refill   . ALPRAZolam (XANAX) 0.25 MG tablet Take 0.5 tablets (0.125 mg total) by mouth 3 (three) times daily as needed for anxiety. (Patient taking differently: Take 0.125 mg by mouth at bedtime as needed for anxiety. ) 60 tablet 2  . amiodarone (PACERONE) 100 MG tablet Take 1 tablet (100 mg total) by mouth daily. 30 tablet 3  . apixaban (ELIQUIS) 2.5 MG TABS tablet Take 1 tablet (2.5 mg total) by mouth 2 (two) times daily. 10am and 6:30pm 60 tablet 3  . cycloSPORINE (RESTASIS) 0.05 % ophthalmic emulsion Place 1 drop into both eyes 2 (two) times daily.     . furosemide (LASIX) 40 MG tablet Take 1.5 tablets (60 mg total) by mouth 2 (two) times daily. 10am and 4pm 90 tablet 3  . guaiFENesin (MUCINEX) 600 MG 12 hr tablet Take 600 mg by mouth daily.     [name] levothyroxine (SYNTHROID, LEVOTHROID) 50 MCG tablet take 1 tablet by mouth once daily BEFORE BREAKFAST 30 tablet 2  . metolazone (ZAROXOLYN) 2.5 MG tablet Take 0.5 tablets (1.25 mg total) by mouth as needed. 4 tablet 3  . Multiple Vitamins-Minerals (CENTRUM SILVER) CHEW Chew 1 tablet by mouth daily.    . nitrofurantoin (MACRODANTIN) 50 MG capsule Take 50 mg by mouth at bedtime.    Vladimir Faster Glycol-Propyl Glycol (SYSTANE) 0.4-0.3 % GEL  Place 1 drop into both eyes at bedtime.    . Polyvinyl Alcohol-Povidone (FRESHKOTE OP) Place 1 drop into both eyes 4 (four) times daily.     . potassium chloride (MICRO-K) 10 MEQ CR capsule Take 40 meq (4 tablets) in am and 20 meq (2 tablets) in pm. (Patient taking differently: Take 20 meq (2 tablets) in am and 40 meq (4 tablets) in pm.) 180 capsule 3   No current facility-administered medications for this encounter.    Filed Vitals:   04/17/15 1010  BP: 102/60  Pulse: 72  Weight: 89 lb (40.37 kg)  SpO2: 96%   PHYSICAL EXAM: General:  Elderly in wheelchair. No resp difficulty son and daughter present HEENT: normal Neck: supple. JVP 10 Carotids 2+ bilaterally; no bruits. No lymphadenopathy or thryomegaly  appreciated. Cor: s/p L mastectomy. PMI normal. Regular rate & regular rhythm. 3/6 MR murmur. No rubs, gallops. Lungs: crackles at bases bilaterally.  Abdomen: soft, nontender, + distended. No hepatosplenomegaly. No bruits or masses. Good bowel sounds. Extremities: no cyanosis, clubbing, rash. 1+ ankle edema.  Neuro: alert & orientedx3, cranial nerves grossly intact. Moves all 4 extremities w/o difficulty. Affect pleasant.   ASSESSMENT & PLAN:  1) Chronic diastolic HF:   Worsening symptoms. More fatigue. Now NYHA IIIB despite weight coming down. OVerall I am concerned that she is nearing the end. We again discussed possibility of Hospice but family isn't ready for that yet. Will increase metolazone sliding scale to take up to 2x/week to try and get weight to 87-88 lbs. Take with extra potassium  - Check CBC, BMET today.  2) Atrial Fibrillation:  Appears to be in NSR today. Continue amio 100 daily, can take extra as needed . Continue apixaban 2.5 bid. Recent LFTs, CBC ok. TSH was up and free T4 was down. Dr. Doug Sou increased synthroid. We will recheck those labs as well.  3) DNR/DNI - she is followed by Rosanne Sack with Palliative Care on outpatient basis.  F/U 3 months or as needed  Glori Bickers MD  04/17/2015

## 2015-04-17 NOTE — Addendum Note (Signed)
Encounter addended by: Effie Berkshire, RN on: 04/17/2015 10:49 AM<BR>     Documentation filed: [employer], Patient Instructions Section, Orders

## 2015-04-17 NOTE — Patient Instructions (Addendum)
Routine labs today. Will notify you of abnormal results, otherwise no news is good news!  Take Metolazone 2.5 mg (1/2 tablet) twice WEEKLY as needed to maintain weight 87-88 pounds.  Follow up 6-8 weeks.  Do the following things EVERYDAY: 1) Weigh yourself in the morning before breakfast. Write it down and keep it in a log. 2) Take your medicines as prescribed 3) Eat low salt foods-Limit salt (sodium) to 2000 mg per day.  4) Stay as active as you can everyday 5) Limit all fluids for the day to less than 2 liters

## 2015-04-18 ENCOUNTER — Other Ambulatory Visit: Payer: Self-pay | Admitting: Internal Medicine

## 2015-04-18 MED ORDER — LEVOTHYROXINE SODIUM 25 MCG PO TABS: 25.0000 ug | ORAL_TABLET | Freq: Every day | ORAL | Status: DC

## 2015-04-21 ENCOUNTER — Other Ambulatory Visit (HOSPITAL_COMMUNITY): Payer: Self-pay | Admitting: *Deleted

## 2015-04-21 DIAGNOSIS — I4891 Unspecified atrial fibrillation: Secondary | ICD-10-CM

## 2015-04-21 MED ORDER — AMIODARONE HCL 100 MG PO TABS: 100.0000 mg | ORAL_TABLET | Freq: Every day | ORAL | Status: DC

## 2015-05-09 ENCOUNTER — Telehealth (HOSPITAL_COMMUNITY): Payer: Self-pay | Admitting: *Deleted

## 2015-05-09 NOTE — Telephone Encounter (Signed)
[facility] called to let us know that pt had accepted their services and they will now be following the pt

## 2015-05-19 ENCOUNTER — Encounter (HOSPITAL_COMMUNITY): Payer: Medicare Other

## 2015-05-29 ENCOUNTER — Ambulatory Visit (HOSPITAL_COMMUNITY)
Admission: RE | Admit: 2015-05-29 | Discharge: 2015-05-29 | Disposition: A | Discharge status: Home / Self Care | Source: Ambulatory Visit | Attending: Cardiology | Admitting: Cardiology

## 2015-05-29 ENCOUNTER — Encounter (HOSPITAL_COMMUNITY): Payer: Self-pay

## 2015-05-29 VITALS — BP 110/66 | HR 75 | Wt 91.5 lb

## 2015-05-29 DIAGNOSIS — I340 Nonrheumatic mitral (valve) insufficiency: Secondary | ICD-10-CM | POA: Insufficient documentation

## 2015-05-29 DIAGNOSIS — G629 Polyneuropathy, unspecified: Secondary | ICD-10-CM | POA: Diagnosis not present

## 2015-05-29 DIAGNOSIS — I5032 Chronic diastolic (congestive) heart failure: Secondary | ICD-10-CM | POA: Diagnosis present

## 2015-05-29 DIAGNOSIS — I480 Paroxysmal atrial fibrillation: Secondary | ICD-10-CM

## 2015-05-29 DIAGNOSIS — E785 Hyperlipidemia, unspecified: Secondary | ICD-10-CM | POA: Diagnosis not present

## 2015-05-29 DIAGNOSIS — E780 Pure hypercholesterolemia: Secondary | ICD-10-CM | POA: Diagnosis not present

## 2015-05-29 DIAGNOSIS — I2510 Atherosclerotic heart disease of native coronary artery without angina pectoris: Secondary | ICD-10-CM | POA: Diagnosis not present

## 2015-05-29 DIAGNOSIS — Z7902 Long term (current) use of antithrombotics/antiplatelets: Secondary | ICD-10-CM | POA: Insufficient documentation

## 2015-05-29 DIAGNOSIS — Z66 Do not resuscitate: Secondary | ICD-10-CM | POA: Insufficient documentation

## 2015-05-29 DIAGNOSIS — E039 Hypothyroidism, unspecified: Secondary | ICD-10-CM | POA: Insufficient documentation

## 2015-05-29 DIAGNOSIS — Z853 Personal history of malignant neoplasm of breast: Secondary | ICD-10-CM | POA: Insufficient documentation

## 2015-05-29 DIAGNOSIS — Z79899 Other long term (current) drug therapy: Secondary | ICD-10-CM | POA: Diagnosis not present

## 2015-05-29 DIAGNOSIS — I4891 Unspecified atrial fibrillation: Secondary | ICD-10-CM | POA: Insufficient documentation

## 2015-05-29 MED ORDER — METOLAZONE 2.5 MG PO TABS: ORAL_TABLET | ORAL | Status: DC

## 2015-05-29 NOTE — Addendum Note (Signed)
Encounter addended by: Effie Berkshire, RN on: 05/29/2015  4:22 PM<BR>     Documentation filed: Medications, Patient Instructions Section, Orders

## 2015-05-29 NOTE — Progress Notes (Signed)
Patient ID: [name], female   DOB: 05-02-1918, 81 y.o.   MRN: [id number] PCP: Dr. Doug Sou  HPI: [name] is a 81 y.o. female with history of diastolic heart failure, right heart failure, atrial fibrillation, severe mitral regurgitation not surgical candidate, HL and hypothyroidism.      Echo 08/28/12: LVEF 55-60%.  Mild AI, Severe MR.  LA Mod to severely dilated.  PFO/small ASD.  PAPP 70 mmHg.   Echo 12/25/13: EF 55-60%, grade II DD, mild/mod AI, mod/severe MR, LA severely dilated, RV mod/severely dilated and sys fx mod/severely reduced, mod TR  Last d/c weight 94 lbs.   Follow up for Heart Failure: Here for f/u with her daughter. At last visit we increased metolazone to sliding scale with goal of 87-88 lbs.  Up two lbs since prior visit. Has been need metolazone once a week to stay goal. Over past few weeks has gotten worse.  Her energy level is about the same. Has DOE with all ADLs per family. Have been taking metolazone for weight >=89 pounds. Still has peripheral edema, worse in past few months.  Abdominal bloating better. and abdominal bloating. One brief episode of AF since last visit, asymptomatic.  Took an extra amiodarone and has had none since. Sleeps in hospital bed with head of bed raised. No bleeding with Eliquis   SH: Lives with her son and daughter.   ROS: All systems negative except as listed in HPI, PMH and Problem List.  Past Medical History  Diagnosis Date  . Coronary artery disease   . Peripheral neuropathy   . Aortic insufficiency     mild  . Cerumen impaction   . Unspecified diastolic heart failure   . Hyperlipemia   . Mitral valve prolapse     "severe" (09/22/2012)  . Mitral regurgitation     severe  . Urosepsis   . Anemia, unspecified   . Osteoporosis   . Adenocarcinoma, breast   . Irritable bladder   . Urinary incontinence   . Hypothyroidism   . H/O: whooping cough   . Hypercholesterolemia     "stopped statins ~ 2 yr ago" (09/22/2012)  .  Heart murmur   . CHF (congestive heart failure)   . Shortness of breath     "basically all the time; worse w/exertion" (09/22/2012)  . Bleeding hemorrhoids ~ 2011  . Osteoarthritis     "knees, hips, hands" (09/22/2012)    Current Outpatient Prescriptions  Medication Sig Dispense Refill  . ALPRAZolam (XANAX) 0.25 MG tablet Take 0.5 tablets (0.125 mg total) by mouth 3 (three) times daily as needed for anxiety. (Patient taking differently: Take 0.125 mg by mouth at bedtime as needed for anxiety. ) 60 tablet 2  . amiodarone (PACERONE) 100 MG tablet Take 1 tablet (100 mg total) by mouth daily. 30 tablet 3  . apixaban (ELIQUIS) 2.5 MG TABS tablet Take 1 tablet (2.5 mg total) by mouth 2 (two) times daily. 10am and 6:30pm 60 tablet 3  . cycloSPORINE (RESTASIS) 0.05 % ophthalmic emulsion Place 1 drop into both eyes 2 (two) times daily.     . furosemide (LASIX) 40 MG tablet Take 1.5 tablets (60 mg total) by mouth 2 (two) times daily. 10am and 4pm 90 tablet 3  . guaiFENesin (MUCINEX) 600 MG 12 hr tablet Take 600 mg by mouth daily.     [name] levothyroxine (LEVOTHROID) 25 MCG tablet Take 1 tablet (25 mcg total) by mouth daily before breakfast. 30 tablet 2  . metolazone (ZAROXOLYN)  2.5 MG tablet Take 2.5 mg (1/2 tablet) twice WEEKLY as needed to maintain weight 87-88 pounds 5 tablet 6  . Multiple Vitamins-Minerals (CENTRUM SILVER) CHEW Chew 1 tablet by mouth daily.    . nitrofurantoin (MACRODANTIN) 50 MG capsule Take 50 mg by mouth at bedtime.    Vladimir Faster Glycol-Propyl Glycol (SYSTANE) 0.4-0.3 % GEL Place 1 drop into both eyes at bedtime.    . Polyvinyl Alcohol-Povidone (FRESHKOTE OP) Place 1 drop into both eyes 4 (four) times daily.     . potassium chloride (MICRO-K) 10 MEQ CR capsule Take 40 meq (4 tablets) in am and 20 meq (2 tablets) in pm. (Patient taking differently: Take 20 meq (2 tablets) in am and 40 meq (4 tablets) in pm.) 180 capsule 3   No current facility-administered medications for this  encounter.    Filed Vitals:   05/29/15 1526  BP: 110/66  Pulse: 75  Weight: 91 lb 8 oz (41.504 kg)  SpO2: 98%   PHYSICAL EXAM: General:  Elderly in wheelchair. No resp difficulty son and daughter present HEENT: normal Neck: supple. JVP 10-12. Carotids 2+ bilaterally; no bruits. No lymphadenopathy or thryomegaly appreciated. Cor: s/p L mastectomy. PMI normal. Regular rate & regular rhythm. 3/6 MR murmur. No rubs, gallops. Lungs: crackles at bases bilaterally.  Abdomen: soft, nontender, + distended. No hepatosplenomegaly. No bruits or masses. Good bowel sounds. Extremities: no cyanosis, clubbing, rash. 2=3+ ankle and feet edema.  Neuro: alert & orientedx3, cranial nerves grossly intact. Moves all 4 extremities w/o difficulty. Affect pleasant.   ASSESSMENT & PLAN:  1) Chronic diastolic HF:   Continues to decline. Losing muscle mass and gaining fluid.  - Increase metolazone to 2x/week to try and keep weight to 84-85 lbs. - - Give extra potassium with metolazone - The have signed up with [facility]. Rosanne Sack to see  2) Atrial Fibrillation:  Appears to be in NSR today. Continue amio 100 daily, can take extra as needed. Risk benefit of apixaban with her chance of falling does not seem to be in her favor any more would consider stopping.  Recent LFTs, CBC ok. TSH was up and free T4 was down. Dr. Doug Sou increased synthroid.  3) DNR/DNI - She is followed by Rosanne Sack with Palliative Care on outpatient basis.  F/U 3 months or as needed  Shirley Friar PA-C 05/29/2015   Patient seen and examined with Oda Kilts, PA-C. We discussed all aspects of the encounter. I agree with the assessment and plan as stated above.   Will increase metolazone. Agree with stopping Eliquis.  Avonne Berkery,MD 4:17 PM

## 2015-05-29 NOTE — Patient Instructions (Signed)
STOP Eliquis.  Take Metolazone twice weekly with 20 meq (2 tabs) of potassium.  Follow up 3 months.  Do the following things EVERYDAY: 1) Weigh yourself in the morning before breakfast. Write it down and keep it in a log. 2) Take your medicines as prescribed 3) Eat low salt foods-Limit salt (sodium) to 2000 mg per day.  4) Stay as active as you can everyday 5) Limit all fluids for the day to less than 2 liters

## 2015-06-27 ENCOUNTER — Other Ambulatory Visit (HOSPITAL_COMMUNITY): Payer: Self-pay | Admitting: Cardiology

## 2015-06-27 ENCOUNTER — Other Ambulatory Visit: Payer: Self-pay

## 2015-06-27 DIAGNOSIS — I5031 Acute diastolic (congestive) heart failure: Secondary | ICD-10-CM

## 2015-06-30 ENCOUNTER — Observation Stay (HOSPITAL_COMMUNITY)
Admission: EM | Admit: 2015-06-30 | Discharge: 2015-07-02 | Disposition: A | Discharge status: Hospice | Source: Intra-hospital | Attending: Internal Medicine | Admitting: Internal Medicine

## 2015-06-30 ENCOUNTER — Emergency Department (HOSPITAL_COMMUNITY)

## 2015-06-30 ENCOUNTER — Encounter (HOSPITAL_COMMUNITY): Payer: Self-pay | Admitting: Emergency Medicine

## 2015-06-30 ENCOUNTER — Other Ambulatory Visit (HOSPITAL_COMMUNITY): Payer: Self-pay | Admitting: *Deleted

## 2015-06-30 DIAGNOSIS — Z66 Do not resuscitate: Secondary | ICD-10-CM | POA: Diagnosis not present

## 2015-06-30 DIAGNOSIS — I340 Nonrheumatic mitral (valve) insufficiency: Secondary | ICD-10-CM | POA: Diagnosis not present

## 2015-06-30 DIAGNOSIS — I080 Rheumatic disorders of both mitral and aortic valves: Secondary | ICD-10-CM | POA: Diagnosis not present

## 2015-06-30 DIAGNOSIS — R4781 Slurred speech: Secondary | ICD-10-CM | POA: Insufficient documentation

## 2015-06-30 DIAGNOSIS — R627 Adult failure to thrive: Secondary | ICD-10-CM | POA: Diagnosis not present

## 2015-06-30 DIAGNOSIS — E039 Hypothyroidism, unspecified: Secondary | ICD-10-CM | POA: Diagnosis not present

## 2015-06-30 DIAGNOSIS — I440 Atrioventricular block, first degree: Secondary | ICD-10-CM

## 2015-06-30 DIAGNOSIS — R1310 Dysphagia, unspecified: Secondary | ICD-10-CM | POA: Diagnosis not present

## 2015-06-30 DIAGNOSIS — Z7401 Bed confinement status: Secondary | ICD-10-CM | POA: Insufficient documentation

## 2015-06-30 DIAGNOSIS — Z79899 Other long term (current) drug therapy: Secondary | ICD-10-CM | POA: Insufficient documentation

## 2015-06-30 DIAGNOSIS — R531 Weakness: Secondary | ICD-10-CM | POA: Insufficient documentation

## 2015-06-30 DIAGNOSIS — I63411 Cerebral infarction due to embolism of right middle cerebral artery: Secondary | ICD-10-CM

## 2015-06-30 DIAGNOSIS — I2510 Atherosclerotic heart disease of native coronary artery without angina pectoris: Secondary | ICD-10-CM | POA: Diagnosis not present

## 2015-06-30 DIAGNOSIS — I480 Paroxysmal atrial fibrillation: Secondary | ICD-10-CM

## 2015-06-30 DIAGNOSIS — I5032 Chronic diastolic (congestive) heart failure: Secondary | ICD-10-CM | POA: Diagnosis not present

## 2015-06-30 DIAGNOSIS — Z681 Body mass index (BMI) 19 or less, adult: Secondary | ICD-10-CM | POA: Diagnosis not present

## 2015-06-30 DIAGNOSIS — R296 Repeated falls: Secondary | ICD-10-CM | POA: Insufficient documentation

## 2015-06-30 DIAGNOSIS — N183 Chronic kidney disease, stage 3 unspecified: Secondary | ICD-10-CM | POA: Diagnosis present

## 2015-06-30 DIAGNOSIS — Z993 Dependence on wheelchair: Secondary | ICD-10-CM | POA: Insufficient documentation

## 2015-06-30 DIAGNOSIS — I4891 Unspecified atrial fibrillation: Secondary | ICD-10-CM | POA: Insufficient documentation

## 2015-06-30 DIAGNOSIS — R471 Dysarthria and anarthria: Secondary | ICD-10-CM | POA: Insufficient documentation

## 2015-06-30 DIAGNOSIS — Z515 Encounter for palliative care: Secondary | ICD-10-CM | POA: Insufficient documentation

## 2015-06-30 DIAGNOSIS — E43 Unspecified severe protein-calorie malnutrition: Secondary | ICD-10-CM | POA: Insufficient documentation

## 2015-06-30 DIAGNOSIS — I482 Chronic atrial fibrillation: Secondary | ICD-10-CM | POA: Diagnosis not present

## 2015-06-30 DIAGNOSIS — I63511 Cerebral infarction due to unspecified occlusion or stenosis of right middle cerebral artery: Secondary | ICD-10-CM | POA: Diagnosis not present

## 2015-06-30 DIAGNOSIS — I639 Cerebral infarction, unspecified: Secondary | ICD-10-CM | POA: Diagnosis present

## 2015-06-30 DIAGNOSIS — G629 Polyneuropathy, unspecified: Secondary | ICD-10-CM | POA: Diagnosis not present

## 2015-06-30 LAB — I-STAT TROPONIN, ED: Troponin i, poc: 0.04 ng/mL (ref 0.00–0.08)

## 2015-06-30 LAB — PROTIME-INR
INR: 1.1 (ref 0.00–1.49)
PROTHROMBIN TIME: 14.4 s (ref 11.6–15.2)

## 2015-06-30 LAB — DIFFERENTIAL
Basophils Absolute: 0 10*3/uL (ref 0.0–0.1)
Basophils Relative: 0 % (ref 0–1)
EOS PCT: 0 % (ref 0–5)
Eosinophils Absolute: 0 10*3/uL (ref 0.0–0.7)
LYMPHS PCT: 15 % (ref 12–46)
Lymphs Abs: 1.1 10*3/uL (ref 0.7–4.0)
MONO ABS: 0.7 10*3/uL (ref 0.1–1.0)
Monocytes Relative: 9 % (ref 3–12)
Neutro Abs: 5.6 10*3/uL (ref 1.7–7.7)
Neutrophils Relative %: 76 % (ref 43–77)

## 2015-06-30 LAB — CBC
HCT: 29.9 % — ABNORMAL LOW (ref 36.0–46.0)
HEMOGLOBIN: 9.5 g/dL — AB (ref 12.0–15.0)
MCH: 30.1 pg (ref 26.0–34.0)
MCHC: 31.8 g/dL (ref 30.0–36.0)
MCV: 94.6 fL (ref 78.0–100.0)
PLATELETS: 170 10*3/uL (ref 150–400)
RBC: 3.16 MIL/uL — ABNORMAL LOW (ref 3.87–5.11)
RDW: 14.5 % (ref 11.5–15.5)
WBC: 7.4 10*3/uL (ref 4.0–10.5)

## 2015-06-30 LAB — I-STAT CHEM 8, ED
BUN: 27 mg/dL — ABNORMAL HIGH (ref 6–20)
CALCIUM ION: 1.04 mmol/L — AB (ref 1.13–1.30)
CHLORIDE: 93 mmol/L — AB (ref 101–111)
Creatinine, Ser: 1.3 mg/dL — ABNORMAL HIGH (ref 0.44–1.00)
GLUCOSE: 95 mg/dL (ref 65–99)
HCT: 30 % — ABNORMAL LOW (ref 36.0–46.0)
Hemoglobin: 10.2 g/dL — ABNORMAL LOW (ref 12.0–15.0)
Potassium: 3.5 mmol/L (ref 3.5–5.1)
SODIUM: 137 mmol/L (ref 135–145)
TCO2: 35 mmol/L (ref 0–100)

## 2015-06-30 LAB — COMPREHENSIVE METABOLIC PANEL
ALK PHOS: 77 U/L (ref 38–126)
ALT: 12 U/L — ABNORMAL LOW (ref 14–54)
AST: 24 U/L (ref 15–41)
Albumin: 2.6 g/dL — ABNORMAL LOW (ref 3.5–5.0)
Anion gap: 8 (ref 5–15)
BILIRUBIN TOTAL: 0.9 mg/dL (ref 0.3–1.2)
BUN: 22 mg/dL — ABNORMAL HIGH (ref 6–20)
CALCIUM: 8.6 mg/dL — AB (ref 8.9–10.3)
CO2: 34 mmol/L — AB (ref 22–32)
CREATININE: 1.21 mg/dL — AB (ref 0.44–1.00)
Chloride: 96 mmol/L — ABNORMAL LOW (ref 101–111)
GFR calc non Af Amer: 37 mL/min — ABNORMAL LOW (ref >60)
GFR, EST AFRICAN AMERICAN: 43 mL/min — AB (ref >60)
Glucose, Bld: 97 mg/dL (ref 65–99)
Potassium: 3.5 mmol/L (ref 3.5–5.1)
Sodium: 138 mmol/L (ref 135–145)
TOTAL PROTEIN: 6.4 g/dL — AB (ref 6.5–8.1)

## 2015-06-30 LAB — APTT: aPTT: 33 seconds (ref 24–37)

## 2015-06-30 MED ORDER — MORPHINE SULFATE (PF) 2 MG/ML IV SOLN: 1.0000 mg | INTRAVENOUS | Status: DC | PRN

## 2015-06-30 MED ORDER — LORAZEPAM 2 MG/ML IJ SOLN
0.5000 mg | Freq: Three times a day (TID) | INTRAMUSCULAR | Status: DC | PRN
  Administered 2015-07-02: 0.5 mg via INTRAVENOUS
  Filled 2015-06-30: qty 1

## 2015-06-30 MED ORDER — SODIUM CHLORIDE 0.9 % IV SOLN: INTRAVENOUS | Status: AC

## 2015-06-30 MED ORDER — FUROSEMIDE 10 MG/ML IJ SOLN
40.0000 mg | Freq: Every day | INTRAMUSCULAR | Status: DC | PRN
  Administered 2015-07-02: 40 mg via INTRAVENOUS
  Filled 2015-06-30: qty 4

## 2015-06-30 MED ORDER — LORAZEPAM 2 MG/ML IJ SOLN: 0.5000 mg | Freq: Three times a day (TID) | INTRAMUSCULAR | Status: DC | PRN

## 2015-06-30 NOTE — ED Notes (Signed)
EMS - Patient coming from home with Code Stroke.  Patient had right sided gauze, droop and drooling and left sided paralysis.  LKN 06:00 on 06/30/15.

## 2015-06-30 NOTE — Progress Notes (Signed)
Patient arrived to 5M11 at 1645 from ED. Vital signs taken and charted. O2 and IVF. No telemetry. Oriented to room with bed alarm on. Admitting MD notified of patient's arrival.  Family at bedside.

## 2015-06-30 NOTE — Code Documentation (Signed)
81yo female arriving to [facility] via [place] at 1237.  Patient was assisted to the bathroom at 0600 by her son and was noted to be at her baseline.  Patient went back to bed and family checked on her around 1200 and noticed her to have left sided weakness.  EMS called and activated a Code Stroke for right gaze, dysarthria and left hemiplegia.  Stroke team at the bedside on arrival.  Labs drawn and patient cleared by Dr. Mingo Amber.  Patient to CT.  Dr. Erlinda Hong to the bedside.  NIHSS 22, see documentation for details and code stroke times.  Patient's family at the bedside and reports patient needs assistance with all ADLs and is wheelchair bound.  Patient is outside the window for treatment with tPA and is not an endovascular treatment candidate d/t mRS >2.  Of note, patient with h/o atrial fibrillation and anticoagulation was discontinued in July.  Bedside handoff with ED RN Santiago Glad.

## 2015-06-30 NOTE — H&P (Signed)
[facility] History and Physical  [name] TDS:[id number] DOB: Jun 03, 1918 DOA: 06/30/2015  Referring physician: Dr. Peyton Bottoms PCP: Olga Millers, MD   Chief Complaint:  Acute Left-sided weakness   HPI:  81 year old female with A. fib (discontinued and the case one month back due to recurrent fall, deconditioning and goal for home hospice), severe diastolic CHF, severe MR are not a surgical candidate, CK D, hypothyroidism, bedbound requiring assistance with all her ADLs except for feeding presented with acute onset of left-sided weakness and right-sided gaze with slurred speech since this morning. Patient as per family provided history at bedside was at her baseline around 6 AM. During lunch time she was found to have left-sided weakness with minimal speech. EMS was called and patient brought to [facility] [place]. Code stroke was called but patient was out of therapeutic window for TPA and not considered a candidate for any further intervention. Head CT was negative for acute stroke or hemorrhage. Neurology (stroke service) was consulted who recommended observing her with follow-up head CT tomorrow and eventual home hospice. Underlying this factors include A. fib off anticoagulation.  Vitals in the ED was stable.  Hospitalists admission requested. As per patient's son and daughter at bedside who take care of her at home, [facility] been involved in the care and follows her for symptomatic management and home care needs once a week. They think they are unable to take adequate care for by themselves with her current situation. Discussed at length with him and plan to admit her for observation overnight with goal of comfort, contact their home hospice and plan on discharging her back home with escalating hospice services tomorrow (unless condition deteriorates or complicated).    Review of Systems:  As outlined in history of present illness  Past Medical History  Diagnosis  Date  . Coronary artery disease   . Peripheral neuropathy   . Aortic insufficiency     mild  . Cerumen impaction   . Unspecified diastolic heart failure   . Hyperlipemia   . Mitral valve prolapse     "severe" (09/22/2012)  . Mitral regurgitation     severe  . Urosepsis   . Anemia, unspecified   . Osteoporosis   . Adenocarcinoma, breast   . Irritable bladder   . Urinary incontinence   . Hypothyroidism   . H/O: whooping cough   . Hypercholesterolemia     "stopped statins ~ 2 yr ago" (09/22/2012)  . Heart murmur   . CHF (congestive heart failure)   . Shortness of breath     "basically all the time; worse w/exertion" (09/22/2012)  . Bleeding hemorrhoids ~ 2011  . Osteoarthritis     "knees, hips, hands" (09/22/2012)   Past Surgical History  Procedure Laterality Date  . Appendectomy  10/2004  . Cataract extraction w/ intraocular lens  implant, bilateral  ? 1990's  . Breast biopsy      "several on both sides; years ago; none since 1975" (09/22/2012)  . Tonsillectomy      "as a child" (09/22/2012)  . Mastectomy  ~ 1975    left breast  . Closed reduction patella fracture  ~ 1958    right    Social History:  reports that she has never smoked. She has never used smokeless tobacco. She reports that she does not drink alcohol or use illicit drugs.  No Known Allergies  Family History  Problem Relation Age of Onset  . Heart disease Mother   .  Cancer Mother     breast cancer  . Stroke Father   . Heart disease Father   . Heart disease Sister   . Alzheimer's disease Sister   . Heart disease Sister   . Heart disease Brother     Prior to Admission medications   Medication Sig Start Date End Date Taking? Authorizing Provider  ALPRAZolam (XANAX) 0.25 MG tablet Take 0.5 tablets (0.125 mg total) by mouth 3 (three) times daily as needed for anxiety. Patient taking differently: Take 0.125 mg by mouth at bedtime as needed for anxiety.  12/02/14   Amy D Ninfa Meeker, NP  amiodarone  (PACERONE) 100 MG tablet Take 1 tablet (100 mg total) by mouth daily. 04/21/15   Jolaine Artist, MD  cycloSPORINE (RESTASIS) 0.05 % ophthalmic emulsion Place 1 drop into both eyes 2 (two) times daily.     Historical Provider, MD  furosemide (LASIX) 40 MG tablet Take 1.5 tablets (60 mg total) by mouth 2 (two) times daily. 10am and 4pm 03/26/15   Jolaine Artist, MD  guaiFENesin (MUCINEX) 600 MG 12 hr tablet Take 600 mg by mouth daily.     Historical Provider, MD  levothyroxine (LEVOTHROID) 25 MCG tablet Take 1 tablet (25 mcg total) by mouth daily before breakfast. 04/18/15   Olga Millers, MD  metolazone (ZAROXOLYN) 2.5 MG tablet Take 2.5 mg (1/2 tablet) twice WEEKLY as needed to maintain weight 87-88 pounds. Take with 20 meq (2 tabs) potassium. 05/29/15   Jolaine Artist, MD  Multiple Vitamins-Minerals (CENTRUM SILVER) CHEW Chew 1 tablet by mouth daily.    Historical Provider, MD  nitrofurantoin (MACRODANTIN) 50 MG capsule Take 50 mg by mouth at bedtime.    Historical Provider, MD  Polyethyl Glycol-Propyl Glycol (SYSTANE) 0.4-0.3 % GEL Place 1 drop into both eyes at bedtime.    Historical Provider, MD  Polyvinyl Alcohol-Povidone (FRESHKOTE OP) Place 1 drop into both eyes 4 (four) times daily.     Historical Provider, MD  potassium chloride (MICRO-K) 10 MEQ CR capsule take 4 capsules by mouth every morning and 2 capsules every evening 06/27/15   Larey Dresser, MD     Physical Exam:  Filed Vitals:   06/30/15 1302 06/30/15 1304 06/30/15 1330 06/30/15 1400  BP:  109/56 106/40 111/44  Pulse:  76 69   Resp:  22 19   SpO2: 99% 95% 100%     Constitutional: Vital signs reviewed.  Elderly female in bed, has slurred speech attempting to communicate. responds to simple commnads HEENT: no pallor, no icterus, moist oral mucosa, no cervical lymphadenopathy Cardiovascular: RRR, S1 &s2 irregularly irregular, systolic murmur 3/6 Chest: CTAB, no wheezes, rales, or rhonchi Abdominal: Soft.  Non-tender, non-distended, bowel sounds are normal,  Ext: warm, no edema Neurological: Awake and alert, right-sided gaze with left hemi-neglect, inability to move her left extremity, slurred speech  responds  few simple commands.  Labs on Admission:  Basic Metabolic Panel:  Recent Labs Lab 06/30/15 1238 06/30/15 1244  NA 138 137  K 3.5 3.5  CL 96* 93*  CO2 34*  --   GLUCOSE 97 95  BUN 22* 27*  CREATININE 1.21* 1.30*  CALCIUM 8.6*  --    Liver Function Tests:  Recent Labs Lab 06/30/15 1238  AST 24  ALT 12*  ALKPHOS 77  BILITOT 0.9  PROT 6.4*  ALBUMIN 2.6*   No results for input(s): LIPASE, AMYLASE in the last 168 hours. No results for input(s): AMMONIA in the last 168 hours.  CBC:  Recent Labs Lab 06/30/15 1238 06/30/15 1244  WBC 7.4  --   NEUTROABS 5.6  --   HGB 9.5* 10.2*  HCT 29.9* 30.0*  MCV 94.6  --   PLT 170  --    Cardiac Enzymes: No results for input(s): CKTOTAL, CKMB, CKMBINDEX, TROPONINI in the last 168 hours. BNP: Invalid input(s): POCBNP CBG: No results for input(s): GLUCAP in the last 168 hours.  Radiological Exams on Admission: Ct Head Wo Contrast  06/30/2015   CLINICAL DATA:  Code stroke.  Left-sided weakness.  EXAM: CT HEAD WITHOUT CONTRAST  TECHNIQUE: Contiguous axial images were obtained from the base of the skull through the vertex without intravenous contrast.  COMPARISON:  None.  FINDINGS: Age related cerebral atrophy, ventriculomegaly and periventricular white matter disease. No extra-axial fluid collections are identified. No CT findings for acute hemispheric infarction or intracranial hemorrhage. No mass lesions. The brainstem and cerebellum are normal.  No acute bony findings. There is chronic appearing left-sided maxillary sinusitis with areas of calcification. Small amount fluid and mucoperiosteal thickening in the right maxillary sinus. Left mass delayed effusion noted.  IMPRESSION: 1. Age related cerebral atrophy, ventriculomegaly and  periventricular white matter disease. No definite CT findings for acute hemispheric infarction or intracranial hemorrhage. 2. Paranasal sinus disease and left mastoid effusion as described above. These results were called by telephone at the time of interpretation on 06/30/2015 at 1:06 pm to Dr. Christ Kick, who verbally acknowledged these results.   Electronically Signed   By: Marijo Sanes M.D.   On: 06/30/2015 13:06    EKG:   Assessment/Plan Principal Problem:   CVA (cerebral infarction) Head CT unremarkable but given acute left-sided weakness, dysarthria, left hemi-negative and right-sided gaze on presentation, suggestive of acute right cortical infarct. Head CT on admission unremarkable. Underlying this factors include A. fib (stopped anticoagulation recently due to fall risk). Patient out of therapeutic window for TPA and will refer for comfort. -We'll admit to MedSurg under observation overnight. Goal for full comfort. Neurology recommends repeat head CT tomorrow for follow-up. His high risk for complications including hemorrhagic transformation, cerebral edema and recurrent stroke. -Family agree on full comfort measures. Does not need telemetry or neurochecks. We will request official swallow evaluation. (Failed swallow evaluation in the ED). -Spoke with neurologist Dr. Erlinda Hong who agrees with no aggressive intervention (could possibly obtain repeat head CT tomorrow to inform stroke and to see for extent of lesion) No further stroke workup needed. -Hold for discharging her home with hospice for full comfort tomorrow. I have contacted her hospice agency and their liaison will meet with patient's family tomorrow to set off necessary home needs and services. -Oxygen via nasal cannula. Total morphine for comfort.    MITRAL REGURGITATION, SEVERE Not a surgical candidate. Appears euvolemic. Would place on when necessary IV Lasix for comfort.   Atrial fibrillation Rate controlled. Stop anticoagulation  recently.    CKD (chronic kidney disease) stage 3, GFR 30-59 ml/min Renal function at baseline    Chronic diastolic heart failure Follows with Dr. Haroldine Laws in the heart failure clinic.     Protein-calorie malnutrition, severe     Diet:cardiac  DVT prophylaxis:none   Code Status: DNR Family Communication: discussed at length with son and daughter at bedside Disposition Plan: Admit to [place]. Home with hospice for full comfort tomorrow  Louellen Molder [facility] Pager [phone]  Total time spent on admission :50 minutes  If 7PM-7AM, please contact night-coverage [email or web address] Password [facility] 06/30/2015, 3:19 PM

## 2015-06-30 NOTE — ED Provider Notes (Addendum)
CSN: [id number]     Arrival date & time 06/30/15  1237 History   First MD Initiated Contact with Patient 06/30/15 1319     Chief Complaint  Patient presents with  . Code Stroke    @EDPCLEARED @ (Consider location/radiation/quality/duration/timing/severity/associated sxs/prior Treatment) HPI   [name] is a 81 y.o. female presents for evaluation of altered mental status, weakness and left facial droop, since 11 AM this morning. She lives with her daughter. Her son is also here. At 6:30 this morning. Her daughter got her out of bed as usual, had her drink some water than put her back to bed. This is a usual pattern for the patient. The patient slept through the rest of the morning till about 11 AM when the daughter checked on her again, and felt that the patient was altered from her baseline. Therefore, she called an angled the patient was brought here. There've been no other recent changes in her overall status. She follows up regularly with the heart failure clinic. She is under hospice, requested by the CHF clinic. The patient is confused.   Level V Caveat- Altered Mental Status  Past Medical History  Diagnosis Date  . Coronary artery disease   . Peripheral neuropathy   . Aortic insufficiency     mild  . Cerumen impaction   . Unspecified diastolic heart failure   . Hyperlipemia   . Mitral valve prolapse     "severe" (09/22/2012)  . Mitral regurgitation     severe  . Urosepsis   . Anemia, unspecified   . Osteoporosis   . Adenocarcinoma, breast   . Irritable bladder   . Urinary incontinence   . Hypothyroidism   . H/O: whooping cough   . Hypercholesterolemia     "stopped statins ~ 2 yr ago" (09/22/2012)  . Heart murmur   . CHF (congestive heart failure)   . Shortness of breath     "basically all the time; worse w/exertion" (09/22/2012)  . Bleeding hemorrhoids ~ 2011  . Osteoarthritis     "knees, hips, hands" (09/22/2012)   Past Surgical History  Procedure  Laterality Date  . Appendectomy  10/2004  . Cataract extraction w/ intraocular lens  implant, bilateral  ? 1990's  . Breast biopsy      "several on both sides; years ago; none since 1975" (09/22/2012)  . Tonsillectomy      "as a child" (09/22/2012)  . Mastectomy  ~ 1975    left breast  . Closed reduction patella fracture  ~ 1958    right    Family History  Problem Relation Age of Onset  . Heart disease Mother   . Cancer Mother     breast cancer  . Stroke Father   . Heart disease Father   . Heart disease Sister   . Alzheimer's disease Sister   . Heart disease Sister   . Heart disease Brother    Social History  Substance Use Topics  . Smoking status: Never Smoker   . Smokeless tobacco: Never Used  . Alcohol Use: No   OB History    No data available     Review of Systems  Unable to perform ROS     Allergies  Review of patient's allergies indicates no known allergies.  Home Medications   Prior to Admission medications   Medication Sig Start Date End Date Taking? Authorizing Provider  ALPRAZolam (XANAX) 0.25 MG tablet Take 0.5 tablets (0.125 mg total) by mouth 3 (three) times  daily as needed for anxiety. Patient taking differently: Take 0.125 mg by mouth at bedtime as needed for anxiety.  12/02/14   Amy D Ninfa Meeker, NP  amiodarone (PACERONE) 100 MG tablet Take 1 tablet (100 mg total) by mouth daily. 04/21/15   Jolaine Artist, MD  cycloSPORINE (RESTASIS) 0.05 % ophthalmic emulsion Place 1 drop into both eyes 2 (two) times daily.     Historical Provider, MD  furosemide (LASIX) 40 MG tablet Take 1.5 tablets (60 mg total) by mouth 2 (two) times daily. 10am and 4pm 03/26/15   Jolaine Artist, MD  guaiFENesin (MUCINEX) 600 MG 12 hr tablet Take 600 mg by mouth daily.     Historical Provider, MD  levothyroxine (LEVOTHROID) 25 MCG tablet Take 1 tablet (25 mcg total) by mouth daily before breakfast. 04/18/15   Olga Millers, MD  metolazone (ZAROXOLYN) 2.5 MG tablet Take 2.5  mg (1/2 tablet) twice WEEKLY as needed to maintain weight 87-88 pounds. Take with 20 meq (2 tabs) potassium. 05/29/15   Jolaine Artist, MD  Multiple Vitamins-Minerals (CENTRUM SILVER) CHEW Chew 1 tablet by mouth daily.    Historical Provider, MD  nitrofurantoin (MACRODANTIN) 50 MG capsule Take 50 mg by mouth at bedtime.    Historical Provider, MD  Polyethyl Glycol-Propyl Glycol (SYSTANE) 0.4-0.3 % GEL Place 1 drop into both eyes at bedtime.    Historical Provider, MD  Polyvinyl Alcohol-Povidone (FRESHKOTE OP) Place 1 drop into both eyes 4 (four) times daily.     Historical Provider, MD  potassium chloride (MICRO-K) 10 MEQ CR capsule take 4 capsules by mouth every morning and 2 capsules every evening 06/27/15   Larey Dresser, MD   BP 111/44 mmHg  Pulse 69  Resp 19  SpO2 100% Physical Exam  Constitutional: She appears well-developed.  Elderly, frail  HENT:  Head: Normocephalic and atraumatic.  Right Ear: External ear normal.  Left Ear: External ear normal.  Mucous membranes dry  Eyes: Conjunctivae and EOM are normal. Pupils are equal, round, and reactive to light.  Neck: Normal range of motion and phonation normal. Neck supple.  Cardiovascular: Normal rate, regular rhythm and normal heart sounds.   Pulmonary/Chest: Effort normal and breath sounds normal. She exhibits no bony tenderness.  Abdominal: Soft. There is no tenderness.  Musculoskeletal: Normal range of motion.  Strength left arm, 2 over 5 strength right arm 5 over 5. Strength left leg, 3 over 5 strength. Right leg 4 over 5  Neurological: She is alert. No sensory deficit.  Left facial droop. Follow commands. Does not localize.  Skin: Skin is warm, dry and intact.  Psychiatric: She has a normal mood and affect. Her behavior is normal.  Nursing note and vitals reviewed.   ED Course  Procedures (including critical care time)  She presented as a code stroke patient and was seen initially by the stroke service, Dr. Erlinda Hong. He  decided that the patient should not have thrombolysis because of " Patient not a candidate for TPA due to out of window or for intervention due to baseline functional status. Patient is on home hospice with DO NOT RESUSCITATE".  14:00- Palliative Care Consult  Medications - No data to display  Patient Vitals for the past 24 hrs:  BP Pulse Resp SpO2  06/30/15 1400 (!) 111/44 mmHg - - -  06/30/15 1330 (!) 106/40 mmHg 69 19 100 %  06/30/15 1304 109/56 mmHg 76 22 95 %  06/30/15 1302 - - - 99 %  06/30/15  1300 109/56 mmHg 79 14 (!) 89 %    2:12 PM-Consult complete with Hospitalist. Patient case explained and discussed. He agrees to admit patient for further evaluation and treatment. Call ended at 14:20   Labs Review Labs Reviewed  CBC - Abnormal; Notable for the following:    RBC 3.16 (*)    Hemoglobin 9.5 (*)    HCT 29.9 (*)    All other components within normal limits  COMPREHENSIVE METABOLIC PANEL - Abnormal; Notable for the following:    Chloride 96 (*)    CO2 34 (*)    BUN 22 (*)    Creatinine, Ser 1.21 (*)    Calcium 8.6 (*)    Total Protein 6.4 (*)    Albumin 2.6 (*)    ALT 12 (*)    GFR calc non Af Amer 37 (*)    GFR calc Af Amer 43 (*)    All other components within normal limits  I-STAT CHEM 8, ED - Abnormal; Notable for the following:    Chloride 93 (*)    BUN 27 (*)    Creatinine, Ser 1.30 (*)    Calcium, Ion 1.04 (*)    Hemoglobin 10.2 (*)    HCT 30.0 (*)    All other components within normal limits  PROTIME-INR  APTT  DIFFERENTIAL  I-STAT TROPOININ, ED  CBG MONITORING, ED   Component     Latest Ref Rng 04/17/2015 06/30/2015         12:38 PM  WBC     4.0 - 10.5 K/uL 6.9 7.4  RBC     3.87 - 5.11 MIL/uL 3.24 (L) 3.16 (L)  Hemoglobin     12.0 - 15.0 g/dL 9.6 (L) 9.5 (L)  HCT     36.0 - 46.0 % 31.3 (L) 29.9 (L)  MCV     78.0 - 100.0 fL 96.6 94.6  MCH     26.0 - 34.0 pg 29.6 30.1  MCHC     30.0 - 36.0 g/dL 30.7 31.8  RDW     11.5 - 15.5 % 14.2  14.5  Platelets     150 - 400 K/uL 209 170    Imaging Review Ct Head Wo Contrast  06/30/2015   CLINICAL DATA:  Code stroke.  Left-sided weakness.  EXAM: CT HEAD WITHOUT CONTRAST  TECHNIQUE: Contiguous axial images were obtained from the base of the skull through the vertex without intravenous contrast.  COMPARISON:  None.  FINDINGS: Age related cerebral atrophy, ventriculomegaly and periventricular white matter disease. No extra-axial fluid collections are identified. No CT findings for acute hemispheric infarction or intracranial hemorrhage. No mass lesions. The brainstem and cerebellum are normal.  No acute bony findings. There is chronic appearing left-sided maxillary sinusitis with areas of calcification. Small amount fluid and mucoperiosteal thickening in the right maxillary sinus. Left mass delayed effusion noted.  IMPRESSION: 1. Age related cerebral atrophy, ventriculomegaly and periventricular white matter disease. No definite CT findings for acute hemispheric infarction or intracranial hemorrhage. 2. Paranasal sinus disease and left mastoid effusion as described above. These results were called by telephone at the time of interpretation on 06/30/2015 at 1:06 pm to Dr. Christ Kick, who verbally acknowledged these results.   Electronically Signed   By: Marijo Sanes M.D.   On: 06/30/2015 13:06   I have personally reviewed and evaluated these images and lab results as part of my medical decision-making.   EKG Interpretation None      MDM  Final diagnoses:  Cerebral infarction due to unspecified mechanism  Palliative care status  First degree heart block    Acute CVA, hospice patient, moderate disability. No overt evidence for acute infectious or metabolic process.  Nursing Notes Reviewed/ Care Coordinated, and agree without changes. Applicable Imaging Reviewed.  Interpretation of Laboratory Data incorporated into ED treatment  Plan: Admit    Daleen Bo, MD 06/30/15  [place], MD 06/30/15 [phone]

## 2015-06-30 NOTE — Consult Note (Addendum)
Referring Physician: ED Dr. Eulis Foster    Chief Complaint: Left-sided weakness with right-sided gaze  HPI: [name] is an 81 y.o. female with history of atrial fibrillation discontinued Eliquis in July, CHF, severe MR not surgical candidate, CKD and hypothyroidism stroke alerted for left-sided weakness and right-sided gaze.  At baseline, she is wheelchair-bound, has home hospice nurse visit twice a week, need assistance for all ADLs. As per family, she was apparently at her baseline this morning around 6 AM, however, around lunchtime, she was found to have left-sided paralyzed and right-sided gaze, minimal speech. EMS was called, patient was sent to [facility] for a code stroke. CT head stat did not show acute hemorrhage. Patient not a candidate for TPA or intervention. She will be admitted for further management with likely transition back to home hospice.  She has extensive history of cardiac disease, including diastolic heart failure, right heart failure, atrial fibrillation, severe mitral regurgitation not surgical candidate. She follows with Dr. Haroldine Laws, and last visit was 05/29/2015 when her Eliquis was discontinued due to risk of falling, anemia and overall worsening general condition. She also has hypothyroidism likely due to long-term use of amiodarone. She has severe failure to thrive, on metolazone to sliding scale with goal of 87-88 lbs.As per family, she has been declining for the last 2 months.  LSN: 6am 06/30/15 tPA Given: No: out of window  Past Medical History  Diagnosis Date  . Coronary artery disease   . Peripheral neuropathy   . Aortic insufficiency     mild  . Cerumen impaction   . Unspecified diastolic heart failure   . Hyperlipemia   . Mitral valve prolapse     "severe" (09/22/2012)  . Mitral regurgitation     severe  . Urosepsis   . Anemia, unspecified   . Osteoporosis   . Adenocarcinoma, breast   . Irritable bladder   . Urinary incontinence   . Hypothyroidism    . H/O: whooping cough   . Hypercholesterolemia     "stopped statins ~ 2 yr ago" (09/22/2012)  . Heart murmur   . CHF (congestive heart failure)   . Shortness of breath     "basically all the time; worse w/exertion" (09/22/2012)  . Bleeding hemorrhoids ~ 2011  . Osteoarthritis     "knees, hips, hands" (09/22/2012)    Past Surgical History  Procedure Laterality Date  . Appendectomy  10/2004  . Cataract extraction w/ intraocular lens  implant, bilateral  ? 1990's  . Breast biopsy      "several on both sides; years ago; none since 1975" (09/22/2012)  . Tonsillectomy      "as a child" (09/22/2012)  . Mastectomy  ~ 1975    left breast  . Closed reduction patella fracture  ~ 1958    right     Family History  Problem Relation Age of Onset  . Heart disease Mother   . Cancer Mother     breast cancer  . Stroke Father   . Heart disease Father   . Heart disease Sister   . Alzheimer's disease Sister   . Heart disease Sister   . Heart disease Brother    Social History:  reports that she has never smoked. She has never used smokeless tobacco. She reports that she does not drink alcohol or use illicit drugs.  Allergies: No Known Allergies  Medications:  No current facility-administered medications on file prior to encounter.   Current Outpatient Prescriptions on File Prior to  Encounter  Medication Sig Dispense Refill  . ALPRAZolam (XANAX) 0.25 MG tablet Take 0.5 tablets (0.125 mg total) by mouth 3 (three) times daily as needed for anxiety. (Patient taking differently: Take 0.125 mg by mouth at bedtime as needed for anxiety. ) 60 tablet 2  . amiodarone (PACERONE) 100 MG tablet Take 1 tablet (100 mg total) by mouth daily. 30 tablet 3  . cycloSPORINE (RESTASIS) 0.05 % ophthalmic emulsion Place 1 drop into both eyes 2 (two) times daily.     . furosemide (LASIX) 40 MG tablet Take 1.5 tablets (60 mg total) by mouth 2 (two) times daily. 10am and 4pm 90 tablet 3  . guaiFENesin (MUCINEX)  600 MG 12 hr tablet Take 600 mg by mouth daily.     [name] levothyroxine (LEVOTHROID) 25 MCG tablet Take 1 tablet (25 mcg total) by mouth daily before breakfast. 30 tablet 2  . metolazone (ZAROXOLYN) 2.5 MG tablet Take 2.5 mg (1/2 tablet) twice WEEKLY as needed to maintain weight 87-88 pounds. Take with 20 meq (2 tabs) potassium. 5 tablet 6  . Multiple Vitamins-Minerals (CENTRUM SILVER) CHEW Chew 1 tablet by mouth daily.    . nitrofurantoin (MACRODANTIN) 50 MG capsule Take 50 mg by mouth at bedtime.    Vladimir Faster Glycol-Propyl Glycol (SYSTANE) 0.4-0.3 % GEL Place 1 drop into both eyes at bedtime.    . Polyvinyl Alcohol-Povidone (FRESHKOTE OP) Place 1 drop into both eyes 4 (four) times daily.     . potassium chloride (MICRO-K) 10 MEQ CR capsule take 4 capsules by mouth every morning and 2 capsules every evening 180 capsule 3    ROS: Review of system cannot be completed due to severe dysarthria and drowsiness. Family provided information regarding ROS as mentioned in HPI.  Physical Examination:  Pulse Rate:  [69-76] 69 (08/29 1330) Resp:  [19-22] 19 (08/29 1330) BP: (106-109)/(40-56) 106/40 mmHg (08/29 1330) SpO2:  [95 %-100 %] 100 % (08/29 1330)  General - very cachectic, well developed, severe dysarthria.  Ophthalmologic - Fundi not visualized due to noncooperation.  Cardiovascular - Regular rate and rhythm, not in A. fib rhythm.  Mental Status -  Sleepy, but easily arousable, following limited simple commands. Paucity of speech, severe dysarthria. Name one out of 2 subjects, able to repeat but in severe dysarthria. Left-sided neglect.  Cranial Nerves II - XII - II - not blinking to visual threat on the left. III, IV, VI - right-sided forced gaze. V - Facial sensation not incorporating on exam. VII - left facial droop. VIII - Hard of hearing X - not cooperative on exam. XI - not cooperative on exam. XII - Tongue midline in mouth.  Motor Strength - The patient's strength was 4/5 RUE  and 3-/5 RLE but 0/5 LUE and LLE.  Bulk was normal and fasciculations were absent.   Motor Tone - Muscle tone was decreased at the neck and appendages.  Reflexes - The patient's reflexes were 1+ in all extremities and she had no pathological reflexes.  Sensory - not cooperative on exam.    Coordination - not cooperative on exam. Tremor was absent  Gait and Station - not tested due to weakness.   Results for orders placed or performed during the hospital encounter of 06/30/15 (from the past 48 hour(s))  Protime-INR     Status: None   Collection Time: 06/30/15 12:38 PM  Result Value Ref Range   Prothrombin Time 14.4 11.6 - 15.2 seconds   INR 1.10 0.00 - 1.49  APTT  Status: None   Collection Time: 06/30/15 12:38 PM  Result Value Ref Range   aPTT 33 24 - 37 seconds  CBC     Status: Abnormal   Collection Time: 06/30/15 12:38 PM  Result Value Ref Range   WBC 7.4 4.0 - 10.5 K/uL   RBC 3.16 (L) 3.87 - 5.11 MIL/uL   Hemoglobin 9.5 (L) 12.0 - 15.0 g/dL   HCT 29.9 (L) 36.0 - 46.0 %   MCV 94.6 78.0 - 100.0 fL   MCH 30.1 26.0 - 34.0 pg   MCHC 31.8 30.0 - 36.0 g/dL   RDW 14.5 11.5 - 15.5 %   Platelets 170 150 - 400 K/uL  Differential     Status: None   Collection Time: 06/30/15 12:38 PM  Result Value Ref Range   Neutrophils Relative % 76 43 - 77 %   Neutro Abs 5.6 1.7 - 7.7 K/uL   Lymphocytes Relative 15 12 - 46 %   Lymphs Abs 1.1 0.7 - 4.0 K/uL   Monocytes Relative 9 3 - 12 %   Monocytes Absolute 0.7 0.1 - 1.0 K/uL   Eosinophils Relative 0 0 - 5 %   Eosinophils Absolute 0.0 0.0 - 0.7 K/uL   Basophils Relative 0 0 - 1 %   Basophils Absolute 0.0 0.0 - 0.1 K/uL  Comprehensive metabolic panel     Status: Abnormal   Collection Time: 06/30/15 12:38 PM  Result Value Ref Range   Sodium 138 135 - 145 mmol/L   Potassium 3.5 3.5 - 5.1 mmol/L   Chloride 96 (L) 101 - 111 mmol/L   CO2 34 (H) 22 - 32 mmol/L   Glucose, Bld 97 65 - 99 mg/dL   BUN 22 (H) 6 - 20 mg/dL   Creatinine, Ser  1.21 (H) 0.44 - 1.00 mg/dL   Calcium 8.6 (L) 8.9 - 10.3 mg/dL   Total Protein 6.4 (L) 6.5 - 8.1 g/dL   Albumin 2.6 (L) 3.5 - 5.0 g/dL   AST 24 15 - 41 U/L   ALT 12 (L) 14 - 54 U/L   Alkaline Phosphatase 77 38 - 126 U/L   Total Bilirubin 0.9 0.3 - 1.2 mg/dL   GFR calc non Af Amer 37 (L) >60 mL/min   GFR calc Af Amer 43 (L) >60 mL/min    Comment: (NOTE) The eGFR has been calculated using the CKD EPI equation. This calculation has not been validated in all clinical situations. eGFR's persistently <60 mL/min signify possible Chronic Kidney Disease.    Anion gap 8 5 - 15  I-stat troponin, ED (not at [facility], [facility])     Status: None   Collection Time: 06/30/15 12:42 PM  Result Value Ref Range   Troponin i, poc 0.04 0.00 - 0.08 ng/mL   Comment 3            Comment: Due to the release kinetics of cTnI, a negative result within the first hours of the onset of symptoms does not rule out myocardial infarction with certainty. If myocardial infarction is still suspected, repeat the test at appropriate intervals.   I-Stat Chem 8, ED  (not at [facility], [place])     Status: Abnormal   Collection Time: 06/30/15 12:44 PM  Result Value Ref Range   Sodium 137 135 - 145 mmol/L   Potassium 3.5 3.5 - 5.1 mmol/L   Chloride 93 (L) 101 - 111 mmol/L   BUN 27 (H) 6 - 20 mg/dL   Creatinine, Ser 1.30 (H)  0.44 - 1.00 mg/dL   Glucose, Bld 95 65 - 99 mg/dL   Calcium, Ion 1.04 (L) 1.13 - 1.30 mmol/L   TCO2 35 0 - 100 mmol/L   Hemoglobin 10.2 (L) 12.0 - 15.0 g/dL   HCT 30.0 (L) 36.0 - 46.0 %   Ct Head Wo Contrast  06/30/2015   IMPRESSION: 1. Age related cerebral atrophy, ventriculomegaly and periventricular white matter disease. No definite CT findings for acute hemispheric infarction or intracranial hemorrhage. 2. Paranasal sinus disease and left mastoid effusion as described above.  Assessment: 81 y.o. female with history of atrial fibrillation discontinued Eliquis in July, CHF, severe MR not surgical candidate, CKD  and hypothyroidism stroke alerted for left-sided weakness, severe dysarthria and right-sided gaze, and left side neglect. CT without acute hemorrhage. Patient likely has large right cortical infarct due to atrial fibrillation not on anticoagulation. Patient not a candidate for TPA due to out of window or for intervention due to baseline functional status. Patient is on home hospice with DO NOT RESUSCITATE. Discussed with family and they requested no progressive intervention or treatment. Will admit patient to medicine service and need coordinated with hospice facility for eventually transition back to home hospice.   Stroke Risk Factors - atrial fibrillation and CHF  Plan: 1. Admit to medicine for further stabilization 2. Repeat CT in 24 hours to confirm stroke diagnosis, discussed with family and agree with no MRI as it will not alter treatment plan. 3. [place] to coordinate with hospice care. Eventually need to transition back to home hospice. 4. Telemetry monitoring 5. neuro checks 6. Continue home meds.  7. Neurology will follow  This patient is critically ill due to likely right large cortical infarct, chronic A. fib not on anticoagulation, CHF and at significant risk of neurological worsening, death form recurrent infarct, hemorrhagic transformation, cerebral edema, A. fib RVR, CHF. This patient's care review of multiple databases, neurological assessment, discussion with family, other specialists and medical decision making of high complexity. Case discussed with Dr. Eulis Foster in ED. I spent 40 minutes of neurocritical care time in the care of this patient.  Rosalin Hawking, MD PhD Stroke Neurology 06/30/2015 2:00 PM

## 2015-07-01 ENCOUNTER — Observation Stay (HOSPITAL_COMMUNITY)

## 2015-07-01 DIAGNOSIS — I480 Paroxysmal atrial fibrillation: Secondary | ICD-10-CM | POA: Diagnosis not present

## 2015-07-01 DIAGNOSIS — N183 Chronic kidney disease, stage 3 (moderate): Secondary | ICD-10-CM | POA: Diagnosis not present

## 2015-07-01 DIAGNOSIS — I63411 Cerebral infarction due to embolism of right middle cerebral artery: Secondary | ICD-10-CM | POA: Diagnosis not present

## 2015-07-01 DIAGNOSIS — E43 Unspecified severe protein-calorie malnutrition: Secondary | ICD-10-CM

## 2015-07-01 DIAGNOSIS — I63511 Cerebral infarction due to unspecified occlusion or stenosis of right middle cerebral artery: Secondary | ICD-10-CM | POA: Diagnosis not present

## 2015-07-01 DIAGNOSIS — I5032 Chronic diastolic (congestive) heart failure: Secondary | ICD-10-CM | POA: Diagnosis not present

## 2015-07-01 DIAGNOSIS — I080 Rheumatic disorders of both mitral and aortic valves: Secondary | ICD-10-CM | POA: Diagnosis not present

## 2015-07-01 NOTE — Progress Notes (Signed)
[facility] PROGRESS NOTE  [name] DJT:[id number] DOB: 1918-05-02 DOA: 06/30/2015 PCP: Olga Millers, MD  Assessment/Plan:  Principal Problem: Acute right MCA  ischemic infarct  Underlying risk factors include A. fib (stopped anticoagulation recently due to fall risk).  -Hold for full comfort given age, severe MR with diastolic CHF and progressive deconditioning- -failed swallow evaluation. Goal for full comfort . Family interested in residential hospice. Consulted palliative care to help with medications for comfort. -Social work consulted. -Continue O2 via nasal cannula. Low dose morphine for comfort.   MITRAL REGURGITATION, SEVERE Not a surgical candidate. Appears euvolemic.  when necessary IV Lasix for comfort.   Atrial fibrillation Rate controlled. Off anticoagulation recently.   CKD (chronic kidney disease) stage 3, GFR 30-59 ml/min Renal function at baseline   Chronic diastolic heart failure Follows with Dr. Haroldine Laws in the heart failure clinic.    Protein-calorie malnutrition, severe   Code Status: DO NOT RESUSCITATE/comfort measures Family Communication: Son and daughter at bedside  Disposition Plan: Plan on residential hospice   Consultants:  Neurology  Palliative care  Procedures:  Head CT  Antibiotics:  None  HPI/Subjective: Seen and examined. Repeat head CT showing  right MCA territory infarct. Failed swallow evaluation. No improvement in symptoms.  Objective: Filed Vitals:   07/01/15 1329  BP: 115/52  Pulse: 76  Temp: 97.8 F (36.6 C)  Resp: 16   No intake or output data in the 24 hours ending 07/01/15 1413 There were no vitals filed for this visit.  Exam:   General:  Elderly female lying in bed, dysarthric  HEENT: Moist oral mucosa  Cardiovascular: S1 and S2 irregularly irregular, systolic murmur 2/6  Respiratory: Clear bilaterally  Abdomen: , Nondistended, nontender  Musculoskeletal: : No  edema  Since: Alert and awake, left hemineglect with hemiparesis, slurred speech  Data Reviewed: Basic Metabolic Panel:  Recent Labs Lab 06/30/15 1238 06/30/15 1244  NA 138 137  K 3.5 3.5  CL 96* 93*  CO2 34*  --   GLUCOSE 97 95  BUN 22* 27*  CREATININE 1.21* 1.30*  CALCIUM 8.6*  --    Liver Function Tests:  Recent Labs Lab 06/30/15 1238  AST 24  ALT 12*  ALKPHOS 77  BILITOT 0.9  PROT 6.4*  ALBUMIN 2.6*   No results for input(s): LIPASE, AMYLASE in the last 168 hours. No results for input(s): AMMONIA in the last 168 hours. CBC:  Recent Labs Lab 06/30/15 1238 06/30/15 1244  WBC 7.4  --   NEUTROABS 5.6  --   HGB 9.5* 10.2*  HCT 29.9* 30.0*  MCV 94.6  --   PLT 170  --    Cardiac Enzymes: No results for input(s): CKTOTAL, CKMB, CKMBINDEX, TROPONINI in the last 168 hours. BNP (last 3 results) No results for input(s): BNP in the last 8760 hours.  ProBNP (last 3 results) No results for input(s): PROBNP in the last 8760 hours.  CBG: No results for input(s): GLUCAP in the last 168 hours.  No results found for this or any previous visit (from the past 240 hour(s)).   Studies: Ct Head Wo Contrast  07/01/2015   CLINICAL DATA:  Followup scan for left-sided weakness and slurred speech.  EXAM: CT HEAD WITHOUT CONTRAST  TECHNIQUE: Contiguous axial images were obtained from the base of the skull through the vertex without intravenous contrast.  COMPARISON:  06/30/2015  FINDINGS: Skull and Sinuses:No acute osseous findings.  Chronic sinusitis with mineralized material in the left maxillary sinus.  There is re- demonstrated fluid level within the right maxillary sinus. Left mastoid and middle ear effusion.  Orbits: No acute abnormality.  Brain: Cytotoxic type edema involving the posterior right frontal lobe and upper insular cortex. This is approximately 1/3 of the central MCA territory. Stippled density on image 10 is likely volume averaging of a sulcus, no hemorrhagic  conversion is suspected.  Generalized atrophy which is expected for age. Moderate chronic vessel disease with ischemic gliosis throughout the bilateral cerebral white matter.  No hydrocephalus or shift.  IMPRESSION: 1. Acute right MCA territory infarct affecting the insula and posterior right frontal lobe predominantly. 2. Atrophy and chronic small vessel disease. 3. Sinusitis and left otomastoiditis.   Electronically Signed   By: Monte Fantasia M.D.   On: 07/01/2015 10:47   Ct Head Wo Contrast  06/30/2015   CLINICAL DATA:  Code stroke.  Left-sided weakness.  EXAM: CT HEAD WITHOUT CONTRAST  TECHNIQUE: Contiguous axial images were obtained from the base of the skull through the vertex without intravenous contrast.  COMPARISON:  None.  FINDINGS: Age related cerebral atrophy, ventriculomegaly and periventricular white matter disease. No extra-axial fluid collections are identified. No CT findings for acute hemispheric infarction or intracranial hemorrhage. No mass lesions. The brainstem and cerebellum are normal.  No acute bony findings. There is chronic appearing left-sided maxillary sinusitis with areas of calcification. Small amount fluid and mucoperiosteal thickening in the right maxillary sinus. Left mass delayed effusion noted.  IMPRESSION: 1. Age related cerebral atrophy, ventriculomegaly and periventricular white matter disease. No definite CT findings for acute hemispheric infarction or intracranial hemorrhage. 2. Paranasal sinus disease and left mastoid effusion as described above. These results were called by telephone at the time of interpretation on 06/30/2015 at 1:06 pm to Dr. Christ Kick, who verbally acknowledged these results.   Electronically Signed   By: Marijo Sanes M.D.   On: 06/30/2015 13:06    Scheduled Meds: . sodium chloride   Intravenous STAT   Continuous Infusions:     Time spent: 25 minutes    Salvatrice Morandi, [place]  [facility] Pager [phone] If 7PM-7AM, please contact  night-coverage at [email or web address], password [facility] 07/01/2015, 2:13 PM

## 2015-07-01 NOTE — Clinical Social Work Note (Addendum)
Clinical Social Work Assessment  Patient Details  Name: [name] MRN: [id number] Date of Birth: 01-10-1918  Date of referral:  07/01/15               Reason for consult:  Discharge Planning                Permission sought to share information with:  Case Manager, Facility Sport and exercise psychologist, Family Supports Permission granted to share information::  Yes, Verbal Permission Granted  Name::      [name] Catering manager )  Agency::   ([facility] )  Relationship::   (Daughter )  Contact Information:   [phone])  Housing/Transportation Living arrangements for the past 2 months:  Single Family Home Source of Information:  Adult Children Patient Interpreter Needed:  None Criminal Activity/Legal Involvement Pertinent to Current Situation/Hospitalization:  No - Comment as needed Significant Relationships:  Adult Children, Other Family Members Lives with:  Adult Children Do you feel safe going back to the place where you live?  No Need for family participation in patient care:  Yes (Comment)  Care giving concerns: Patient requiring residential hospice placement.    Social Worker assessment / plan:  Holiday representative spoke with patient's dtr, [name] in reference to referral for residential hospice placement. CSW introduced CSW role and placement process. Patient dtr explained that pt is active with [place] [facility] as she has been home with hospice for some time now. Pt's dtr also reported that hospice representative would be meeting with family today for patient to be transitioned into hospice facility. CSW confirmed plans with hospice liaison who will meet with family this afternoon. Patient acute for massive stroke. No further concerns reported by family at this time. CSW able to extend emotional support. CSW will continue to follow pt and pt's family for continued support and to facilitate pt's discharge needs once stable for transfer.   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 Pt's dtr agreeable to residential hospice placement. Dtr reported she is holding up well. Pt's dtr pleasant and involved in pt's care. Pt's dtr appreciated social work intervention.   Patient/Family's Understanding of and Emotional Response to Diagnosis, Current Treatment, and Prognosis:  Pt's dtr understanding of prognosis and plan for comfort care.   Emotional Assessment Appearance:  Appears older than stated age Attitude/Demeanor/Rapport:  Unable to Assess Affect (typically observed):  Unable to Assess Orientation:  Oriented to Self Alcohol / Substance use:  Not Applicable Psych involvement (Current and /or in the community):  No (Comment)  Discharge Needs  Concerns to be addressed:  Care Coordination Readmission within the last 30 days:  No Current discharge risk:  Terminally ill Barriers to Discharge:  Barriers Resolved, Continued Medical Work up   [employer], MSW, LCSWA [phone] 07/01/2015 11:59 AM

## 2015-07-01 NOTE — Consult Note (Signed)
[place] Liaison:  Received request from [place] for family interest in [facility]. Confirmed request with [facility] Liaison Beverlee Nims. Chart reviewed. Met with patient's daughter [name] and son Dominica Severin to confirm interest and answer questions. They are agreeable to transfer to [facility] 06/17/2015 after completing transfer of service paper work with [place]. Will update CSW in am.   Thank you.  Erling Conte, [place]

## 2015-07-01 NOTE — Evaluation (Signed)
Clinical/Bedside Swallow Evaluation Patient Details  Name: [name] MRN: [id number] Date of Birth: 06-17-1918  Today's Date: 07/01/2015 Time: SLP Start Time (ACUTE ONLY): 0831 SLP Stop Time (ACUTE ONLY): 0906 SLP Time Calculation (min) (ACUTE ONLY): 35 min  Past Medical History:  Past Medical History  Diagnosis Date  . Coronary artery disease   . Peripheral neuropathy   . Aortic insufficiency     mild  . Cerumen impaction   . Unspecified diastolic heart failure   . Hyperlipemia   . Mitral valve prolapse     "severe" (09/22/2012)  . Mitral regurgitation     severe  . Urosepsis   . Anemia, unspecified   . Osteoporosis   . Adenocarcinoma, breast   . Irritable bladder   . Urinary incontinence   . Hypothyroidism   . H/O: whooping cough   . Hypercholesterolemia     "stopped statins ~ 2 yr ago" (09/22/2012)  . Heart murmur   . CHF (congestive heart failure)   . Shortness of breath     "basically all the time; worse w/exertion" (09/22/2012)  . Bleeding hemorrhoids ~ 2011  . Osteoarthritis     "knees, hips, hands" (09/22/2012)   Past Surgical History:  Past Surgical History  Procedure Laterality Date  . Appendectomy  10/2004  . Cataract extraction w/ intraocular lens  implant, bilateral  ? 1990's  . Breast biopsy      "several on both sides; years ago; none since 1975" (09/22/2012)  . Tonsillectomy      "as a child" (09/22/2012)  . Mastectomy  ~ 1975    left breast  . Closed reduction patella fracture  ~ 1958    right    HPI:  81 yo female adm to [facility] with left paralysis and right gaze preference- concerning for CVA.  CT head negative.  PMH + for CHF, severe MR - not surgical candidate, frequent falls - now wheelchair and bedridden, impaired hearing.  Swallow evaluation ordered.      Assessment / Plan / Recommendation Clinical Impression  Pt currently not appropriatre for po due to gross weakness and dysphagia.  Oral suction set up by SLP and RN provided pt  oral care prior to evaluation.  Pt was alert and followed directions today- although hearing loss impairs receptive language.  Severe cranial nerve deficits noted impacting swallow musculature.  Single small ice chips provided to pt with delayed oral transiting and open mouth swallow x2 of 2 ice chips offered.  Pt able to cough/clear her throat on command but unfortunately was weak and not effective.  Wet vocal quality noted concerning for laryngeal penetration/aspiration of melted ice.    Pt did not swallow on command, ? motor planning deficits and/or weakness.   Educated daughter [name] to recommendations for NPO and oral care for comfort/moisture.  Requested to daughter for pt to stay upright and cough/throat clear if gurgly due to concern for inflitration of airway with melted water.   Daughter in agreement with recommendations at this time.  Skilled intervention included educating daughter and pt to recommendations.  Prognosis for swallowing to return to functional level is concerning given comorbidities.      Aspiration Risk  Severe    Diet Recommendation NPO   Medication Administration: Via alternative means    Other  Recommendations Oral Care Recommendations: Oral care QID   Follow Up Recommendations    TBD   Frequency and Duration min 2x/week  1 week   Pertinent Vitals/Pain Afebrile, decreased  Swallow Study Prior Functional Status    pt eating regular diet prior to admit without difficulties per daughter [name] Date of Onset: 06/30/15 Other Pertinent Information: 81 yo female adm to [facility] with left paralysis and right gaze preference- concerning for CVA.  CT head negative.  PMH + for CHF, severe MR - not surgical candidate, frequent falls - now wheelchair and bedridden, impaired hearing.  Swallow evaluation ordered.    Type of Study: Bedside swallow evaluation Diet Prior to this Study: NPO Temperature Spikes Noted: No Respiratory Status: Supplemental O2  delivered via (comment) (4 liters) History of Recent Intubation: No Behavior/Cognition: Lethargic/Drowsy;Requires cueing;Doesn't follow directions Oral Cavity - Dentition: Adequate natural dentition/normal for age Self-Feeding Abilities: Total assist Patient Positioning: Upright in bed Baseline Vocal Quality: Low vocal intensity;Hoarse;Breathy Volitional Cough: Weak Volitional Swallow: Unable to elicit    Oral/Motor/Sensory Function Overall Oral Motor/Sensory Function: Impaired Labial ROM: Reduced left Labial Symmetry: Abnormal symmetry left Labial Strength: Reduced Labial Sensation: Reduced Lingual ROM: Reduced left Lingual Symmetry: Abnormal symmetry left Lingual Strength: Reduced Lingual Sensation: Reduced Facial ROM: Reduced left Facial Symmetry: Left droop Facial Strength: Reduced Facial Sensation: Reduced Velum:  (sluggish but bilateral) Mandible: Impaired   Ice Chips Ice chips: Impaired Presentation: Spoon Oral Phase Impairments: Reduced lingual movement/coordination;Impaired anterior to posterior transit;Impaired mastication Oral Phase Functional Implications: Prolonged oral transit Pharyngeal Phase Impairments: Suspected delayed Swallow;Wet Vocal Quality;Throat Clearing - Delayed   Thin Liquid Thin Liquid: Not tested    Nectar Thick Nectar Thick Liquid: Not tested   Honey Thick Honey Thick Liquid: Not tested   Puree Puree: Not tested   Solid   GO    Solid: Not tested       [name], [place] [facility] SLP [phone]

## 2015-07-01 NOTE — Clinical Social Work Note (Signed)
Clinical Social Worker met with patient's family in reference to residential hospice placement. Although patient is currently service connected with [place], family is interested [facility].  CSW has made [facility] referral. If accepted [facility] coverage and care plans will be transferred to [facility]. In the event [name] does NOT has bed availability, family is agreeable to placement at [place]. Family pleasant and agreeable to this plan.   CSW remains available as needed.  Glendon Axe, MSW, LCSWA [phone] 07/01/2015 4:17 PM

## 2015-07-01 NOTE — Progress Notes (Signed)
   07/01/15 0918  SLP G-Codes **NOT FOR INPATIENT CLASS**  Functional Assessment Tool Used clinical judgement  Functional Limitations Swallowing  Swallow Current Status (U4383) CN  Swallow Goal Status (K1840) CM  SLP Evaluations  $ SLP Speech Visit 1 Procedure  SLP Evaluations  $BSS Swallow 1 Procedure  $Swallowing Treatment 1 Procedure  Luanna Salk, [facility] SLP [phone]

## 2015-07-01 NOTE — Progress Notes (Signed)
STROKE TEAM PROGRESS NOTE   SUBJECTIVE (INTERVAL HISTORY) Her family is at the bedside.  They report she is unable to swallow an will be checked again in the am. They are awaiting a visit from the hospice RN.   OBJECTIVE Temp:  [97.5 F (36.4 C)-98.7 F (37.1 C)] 98 F (36.7 C) (08/30 0819) Pulse Rate:  [62-79] 79 (08/30 0819) Cardiac Rhythm:  [-] Normal sinus rhythm (08/29 1307) Resp:  [14-22] 20 (08/30 0819) BP: (91-115)/(33-62) 112/46 mmHg (08/30 0819) SpO2:  [89 %-100 %] 99 % (08/30 0819)  CBC:  Recent Labs Lab 06/30/15 1238 06/30/15 1244  WBC 7.4  --   NEUTROABS 5.6  --   HGB 9.5* 10.2*  HCT 29.9* 30.0*  MCV 94.6  --   PLT 170  --    Basic Metabolic Panel:  Recent Labs Lab 06/30/15 1238 06/30/15 1244  NA 138 137  K 3.5 3.5  CL 96* 93*  CO2 34*  --   GLUCOSE 97 95  BUN 22* 27*  CREATININE 1.21* 1.30*  CALCIUM 8.6*  --    Lipid Panel:    Component Value Date/Time   CHOL 115 02/17/2010 1210   TRIG 155.0* 02/17/2010 1210   HDL 61.10 02/17/2010 1210   CHOLHDL 2 02/17/2010 1210   VLDL 31.0 02/17/2010 1210   LDLCALC 23 02/17/2010 1210   HgbA1c:  Lab Results  Component Value Date   HGBA1C 6.0 04/04/2014   Urine Drug Screen: No results found for: LABOPIA, COCAINSCRNUR, LABBENZ, AMPHETMU, THCU, LABBARB    IMAGING  Ct Head Wo Contrast  07/01/2015   1. Acute right MCA territory infarct affecting the insula and posterior right frontal lobe predominantly. 2. Atrophy and chronic small vessel disease. 3. Sinusitis and left otomastoiditis.     06/30/2015   1. Age related cerebral atrophy, ventriculomegaly and periventricular white matter disease. No definite CT findings for acute hemispheric infarction or intracranial hemorrhage. 2. Paranasal sinus disease and left mastoid effusion    PHYSICAL EXAM  Temp:  [97.5 F (36.4 C)-98.7 F (37.1 C)] 97.5 F (36.4 C) (08/30 2114) Pulse Rate:  [62-79] 79 (08/30 2114) Resp:  [14-20] 17 (08/30 2114) BP:  (91-115)/(33-61) 101/44 mmHg (08/30 2114) SpO2:  [96 %-100 %] 96 % (08/30 2114)  General - very cachectic, well developed, severe dysarthria.  Ophthalmologic - Fundi not visualized due to noncooperation.  Cardiovascular - Regular rate and rhythm, not in A. fib rhythm.  Mental Status -  Lethargic, following limited simple commands. Paucity of speech, severe dysarthria. Name one out of 2 subjects, able to repeat but in severe dysarthria. Left-sided neglect.  Cranial Nerves II - XII - II - not blinking to visual threat on the left. III, IV, VI - right-sided forced gaze. V - Facial sensation not incorporating on exam. VII - left facial droop. VIII - Hard of hearing X - not cooperative on exam. XI - not cooperative on exam. XII - Tongue midline in mouth.  Motor Strength - The patient's strength was 4/5 RUE and 3-/5 RLE but 0/5 LUE and LLE. Bulk was normal and fasciculations were absent.  Motor Tone - Muscle tone was decreased at the neck and appendages.  Reflexes - The patient's reflexes were 1+ in all extremities and she had no pathological reflexes.  Sensory - not cooperative on exam.   Coordination - not cooperative on exam. Tremor was absent  Gait and Station - not tested due to weakness.   ASSESSMENT/PLAN Ms. [name] is  a 81 y.o. female with history of atrial fibrillation discontinued Eliquis in July, CHF, severe MR not surgical candidate, CKD and hypothyroidism stroke alerted for left-sided weakness and right-sided gaze. She has home hospice RN 2x week. She did not receive IV t-PA due to delay in arrival  Stroke:  Moderate to larege right MCA infarct embolic secondary to known atrial fibrillation just off AC in July this year  Resultant  Left hemiparesis, dysphagia, right gaze paresis  MRI  Large R MCA infarct  MRA  No large vessel stenosis  No VTE prophylaxis for comfort   NPO  no antithrombotic prior to admission, now on no antithrombotic for comfort    Disposition:  Family interested in residential hospice (w/c bound, help w/ ADLs, recieves home Hospice PTA)  Hospice re-evaluation by home hospice   Atrial Fibrillation  Anticoagulation stopped 05/29/2015 d/t fall risk  Rate controlled  Other Stroke Risk Factors  Advanced age  Family hx stroke (father)  Coronary artery disease  Other Active Problems  Chronic Diastolic HF  Severe Mitral regurg, not a surgical candidate  Anemia  Hypothyroidism d/t long-term amiodarone  Severe FTT w/ decline past 2 months  CKD stage 3  Dry eyes  Hospital day #   Neurology will sign off. Please call with questions.  Thanks for the consult.  Rosalin Hawking, MD PhD Stroke Neurology 07/01/2015 10:27 PM   To contact Stroke Continuity provider, please refer to [email or web address] After hours, contact General Neurology

## 2015-07-01 NOTE — Progress Notes (Signed)
Nutrition Brief Note  Patient identified due to Low Braden score.  Pt admitted for observation overnight with goal for full comfort with plan to contact their home hospice and plan on discharging her back home with escalating hospice services tomorrow.  No nutrition interventions warranted at this time. If nutrition issues arise, please consult RD.   Scarlette Ar RD, LDN Inpatient Clinical Dietitian Pager: [phone] After Hours Pager: [phone]

## 2015-07-01 NOTE — Care Management Note (Signed)
Case Management Note  Patient Details  Name: [name] MRN: [id number] Date of Birth: 01-06-1918  Subjective/Objective:                    Action/Plan: Consult received to assist with arrangements for patient to continue home hospice services. Patient is active with [facility].  CM contacted Colletta Maryland, hospice RN to discuss plan of care. [phone].  CM will provide additional information regarding discharge when available.  Expected Discharge Date:                  Expected Discharge Plan:  Home w Hospice Care  In-House Referral:     Discharge planning Services  CM Consult  Post Acute Care Choice:  Hospice Choice offered to:  Adult Children  DME Arranged:   (per Hospice) DME Agency:     HH Arranged:    HH Agency:     Status of Service:  In process, will continue to follow  Medicare Important Message Given:    Date Medicare IM Given:    Medicare IM give by:    Date Additional Medicare IM Given:    Additional Medicare Important Message give by:     If discussed at [place] of Stay Meetings, dates discussed:    Additional Comments:  Rolm Baptise, RN 07/01/2015, 10:44 AM

## 2015-07-02 ENCOUNTER — Telehealth: Payer: Self-pay | Admitting: *Deleted

## 2015-07-02 DIAGNOSIS — N183 Chronic kidney disease, stage 3 (moderate): Secondary | ICD-10-CM | POA: Diagnosis not present

## 2015-07-02 DIAGNOSIS — I5032 Chronic diastolic (congestive) heart failure: Secondary | ICD-10-CM | POA: Diagnosis not present

## 2015-07-02 DIAGNOSIS — E43 Unspecified severe protein-calorie malnutrition: Secondary | ICD-10-CM | POA: Diagnosis not present

## 2015-07-02 DIAGNOSIS — I080 Rheumatic disorders of both mitral and aortic valves: Secondary | ICD-10-CM | POA: Diagnosis not present

## 2015-07-02 MED ORDER — CYCLOSPORINE 0.05 % OP EMUL
1.0000 [drp] | Freq: Two times a day (BID) | OPHTHALMIC | Status: DC
  Administered 2015-07-02: 1 [drp] via OPHTHALMIC
  Filled 2015-07-02 (×2): qty 1

## 2015-07-02 MED ORDER — LORAZEPAM 2 MG/ML IJ SOLN: 0.5000 mg | Freq: Three times a day (TID) | INTRAMUSCULAR | Status: AC | PRN

## 2015-07-02 MED ORDER — MORPHINE SULFATE (PF) 2 MG/ML IV SOLN: 1.0000 mg | INTRAVENOUS | Status: AC | PRN

## 2015-07-02 MED ORDER — FUROSEMIDE 10 MG/ML IJ SOLN: 40.0000 mg | Freq: Every day | INTRAMUSCULAR | Status: AC | PRN

## 2015-07-02 MED ORDER — POLYVINYL ALCOHOL 1.4 % OP SOLN
1.0000 [drp] | Freq: Four times a day (QID) | OPHTHALMIC | Status: DC
  Administered 2015-07-02: 1 [drp] via OPHTHALMIC
  Filled 2015-07-02: qty 15

## 2015-07-02 NOTE — Progress Notes (Signed)
SLP Cancellation Note  Patient Details Name: RUBINA BASINSKI MRN: [id number] DOB: 1918-08-26   Cancelled treatment:       Reason Eval/Treat Not Completed: Other (comment) (pt to dc to hospice today - spoke to nurse and no follow up indicated)   Luanna Salk, [facility] SLP [phone]

## 2015-07-02 NOTE — Discharge Summary (Signed)
Physician Discharge Summary  CHERINE DRUMGOOLE FYB:[id number] DOB: October 28, 1918 DOA: 06/30/2015  PCP: Olga Millers, MD  Admit date: 06/30/2015 Discharge date: 06/10/2015  Time spent: 20 minutes  Recommendations for Outpatient Follow-up:  Follow up on as needed basis  Discharge Diagnoses:  Principal Problem:   CVA (cerebral infarction) Active Problems:   MITRAL REGURGITATION, SEVERE   Atrial fibrillation   CKD (chronic kidney disease) stage 3, GFR 30-59 ml/min   Chronic diastolic heart failure   Protein-calorie malnutrition, severe   Discharge Condition: Stable  Diet recommendation: NPO  There were no vitals filed for this visit.  History of present illness:  Please see admit h and p from 8/29 for details. Briefly, pt presented with acute L sided weakness, with concerns for CVA. The patient was admitted for further work up.  Hospital Course:   Principal Problem: Acute right MCA ischemic infarct Underlying risk factors include A. fib (stopped anticoagulation recently due to fall risk). -Held off aggressvie work up and continued full comfort status given age, severe MR, diastolic CHF, and progressive deconditioning - per family wishes -failed swallow evaluation. Family interested in residential hospice. Consulted palliative care to help with medications for comfort. -Social work consulted. -Continue O2 via nasal cannula. Low dose morphine, lasix, ativan for comfort.   MITRAL REGURGITATION, SEVERE Not a surgical candidate. Appears euvolemic. when necessary IV Lasix for comfort.   Atrial fibrillation Rate controlled. Off anticoagulation recently.   CKD (chronic kidney disease) stage 3, GFR 30-59 ml/min Renal function at baseline   Chronic diastolic heart failure Follows with Dr. Haroldine Laws in the heart failure clinic.  Consultations:  Neurology  Discharge Exam: Filed Vitals:   07/01/15 2114 06/09/2015 0151 06/08/2015 0611 06/05/2015 0942  BP: 101/44 117/58  131/67 115/41  Pulse: 79 95 104 95  Temp: 97.5 F (36.4 C) 98.7 F (37.1 C) 98.4 F (36.9 C)   TempSrc: Oral Axillary Axillary   Resp: 17 18 16 18   SpO2: 96% 98% 92% 95%    General: Awake, in nad Cardiovascular: regular, s1, s2 Respiratory: slightly increased resp effort, no wheezing  Discharge Instructions     Medication List    STOP taking these medications        ALPRAZolam 0.25 MG tablet  Commonly known as:  XANAX     amiodarone 100 MG tablet  Commonly known as:  PACERONE     CENTRUM SILVER Chew     furosemide 40 MG tablet  Commonly known as:  LASIX  Replaced by:  furosemide 10 MG/ML injection     guaiFENesin 600 MG 12 hr tablet  Commonly known as:  MUCINEX     levothyroxine 25 MCG tablet  Commonly known as:  LEVOTHROID     metolazone 2.5 MG tablet  Commonly known as:  ZAROXOLYN     nitrofurantoin 50 MG capsule  Commonly known as:  MACRODANTIN     potassium chloride 10 MEQ CR capsule  Commonly known as:  MICRO-K      TAKE these medications        cycloSPORINE 0.05 % ophthalmic emulsion  Commonly known as:  RESTASIS  Place 1 drop into both eyes 2 (two) times daily.     FRESHKOTE OP  Place 1 drop into both eyes 4 (four) times daily.     furosemide 10 MG/ML injection  Commonly known as:  LASIX  Inject 4 mLs (40 mg total) into the vein daily as needed (shortness of breath).     LORazepam 2 MG/ML  injection  Commonly known as:  ATIVAN  Inject 0.25 mLs (0.5 mg total) into the vein every 8 (eight) hours as needed for anxiety.     morphine 2 MG/ML injection  Inject 0.5 mLs (1 mg total) into the vein every 4 (four) hours as needed (dyspnea, comfort).     SYSTANE 0.4-0.3 % Gel  Generic drug:  Polyethyl Glycol-Propyl Glycol  Place 1 drop into both eyes at bedtime.       No Known Allergies Follow-up Information    Follow up with Olga Millers, MD.   Specialty:  Internal Medicine   Why:  As needed   Contact information:   [place] [address] [phone]        The results of significant diagnostics from this hospitalization (including imaging, microbiology, ancillary and laboratory) are listed below for reference.    Significant Diagnostic Studies: Ct Head Wo Contrast  07/01/2015   CLINICAL DATA:  Followup scan for left-sided weakness and slurred speech.  EXAM: CT HEAD WITHOUT CONTRAST  TECHNIQUE: Contiguous axial images were obtained from the base of the skull through the vertex without intravenous contrast.  COMPARISON:  06/30/2015  FINDINGS: Skull and Sinuses:No acute osseous findings.  Chronic sinusitis with mineralized material in the left maxillary sinus. There is re- demonstrated fluid level within the right maxillary sinus. Left mastoid and middle ear effusion.  Orbits: No acute abnormality.  Brain: Cytotoxic type edema involving the posterior right frontal lobe and upper insular cortex. This is approximately 1/3 of the central MCA territory. Stippled density on image 10 is likely volume averaging of a sulcus, no hemorrhagic conversion is suspected.  Generalized atrophy which is expected for age. Moderate chronic vessel disease with ischemic gliosis throughout the bilateral cerebral white matter.  No hydrocephalus or shift.  IMPRESSION: 1. Acute right MCA territory infarct affecting the insula and posterior right frontal lobe predominantly. 2. Atrophy and chronic small vessel disease. 3. Sinusitis and left otomastoiditis.   Electronically Signed   By: Monte Fantasia M.D.   On: 07/01/2015 10:47   Ct Head Wo Contrast  06/30/2015   CLINICAL DATA:  Code stroke.  Left-sided weakness.  EXAM: CT HEAD WITHOUT CONTRAST  TECHNIQUE: Contiguous axial images were obtained from the base of the skull through the vertex without intravenous contrast.  COMPARISON:  None.  FINDINGS: Age related cerebral atrophy, ventriculomegaly and periventricular white matter disease. No extra-axial fluid collections are identified.  No CT findings for acute hemispheric infarction or intracranial hemorrhage. No mass lesions. The brainstem and cerebellum are normal.  No acute bony findings. There is chronic appearing left-sided maxillary sinusitis with areas of calcification. Small amount fluid and mucoperiosteal thickening in the right maxillary sinus. Left mass delayed effusion noted.  IMPRESSION: 1. Age related cerebral atrophy, ventriculomegaly and periventricular white matter disease. No definite CT findings for acute hemispheric infarction or intracranial hemorrhage. 2. Paranasal sinus disease and left mastoid effusion as described above. These results were called by telephone at the time of interpretation on 06/30/2015 at 1:06 pm to Dr. Christ Kick, who verbally acknowledged these results.   Electronically Signed   By: Marijo Sanes M.D.   On: 06/30/2015 13:06    Microbiology: No results found for this or any previous visit (from the past 240 hour(s)).   Labs: Basic Metabolic Panel:  Recent Labs Lab 06/30/15 1238 06/30/15 1244  NA 138 137  K 3.5 3.5  CL 96* 93*  CO2 34*  --  GLUCOSE 97 95  BUN 22* 27*  CREATININE 1.21* 1.30*  CALCIUM 8.6*  --    Liver Function Tests:  Recent Labs Lab 06/30/15 1238  AST 24  ALT 12*  ALKPHOS 77  BILITOT 0.9  PROT 6.4*  ALBUMIN 2.6*   No results for input(s): LIPASE, AMYLASE in the last 168 hours. No results for input(s): AMMONIA in the last 168 hours. CBC:  Recent Labs Lab 06/30/15 1238 06/30/15 1244  WBC 7.4  --   NEUTROABS 5.6  --   HGB 9.5* 10.2*  HCT 29.9* 30.0*  MCV 94.6  --   PLT 170  --    Cardiac Enzymes: No results for input(s): CKTOTAL, CKMB, CKMBINDEX, TROPONINI in the last 168 hours. BNP: BNP (last 3 results) No results for input(s): BNP in the last 8760 hours.  ProBNP (last 3 results) No results for input(s): PROBNP in the last 8760 hours.  CBG: No results for input(s): GLUCAP in the last 168 hours.   Signed:  Shadell Brenn  K  [facility] 06/21/2015, 10:27 AM

## 2015-07-02 NOTE — Progress Notes (Signed)
Pt discharged this am with family at bedside taking all personal belongings. IV discontinued, dry dressing applied. No noted distress. Pt remained on 4L of O2 via n/c. Report called in to nurse Hoyle Sauer  at [facility].

## 2015-07-02 NOTE — Consult Note (Signed)
[facility] Liaison:  Family completed paper work for [employer] transfer including signing transfer of hospice service from [facility] to [facility] [place]. Patient's daughter lives closest to [facility] so transfer makes sense.   Discharge summary faxed to [phone].  RN please call report to [phone].  Thank you.  Erling Conte LCSW  [phone]

## 2015-07-02 NOTE — Clinical Social Work Note (Signed)
CSW met pt's daughter [name] and son Dominica Severin to confirm that the pt will be transported to [facility] today. CSW contact [facility] at [phone] to schedule transport for the pt. CSW informed Harmon Pier at [facility] regarding the pt's discharge. CSW faxed the pt's discharge summary. Bedside RN can call reported to [phone].   Melody Hill, MSW, [place]

## 2015-07-02 NOTE — Telephone Encounter (Signed)
Pt was on tcm llist d/c 8/30, but sent home with Hospice order comfort care...[name]

## 2015-07-03 DEATH — deceased
